# Patient Record
Sex: Female | Born: 1945 | Race: White | Hispanic: No | Smoking: Former smoker
Health system: Southern US, Community
[De-identification: ages and names within clinical notes are randomized; demographics above are authoritative.]

## PROBLEM LIST (undated history)

## (undated) DIAGNOSIS — C801 Malignant (primary) neoplasm, unspecified: Secondary | ICD-10-CM

## (undated) DIAGNOSIS — Z87442 Personal history of urinary calculi: Secondary | ICD-10-CM

## (undated) DIAGNOSIS — C50911 Malignant neoplasm of unspecified site of right female breast: Secondary | ICD-10-CM

## (undated) DIAGNOSIS — F32A Depression, unspecified: Secondary | ICD-10-CM

## (undated) DIAGNOSIS — I509 Heart failure, unspecified: Secondary | ICD-10-CM

## (undated) DIAGNOSIS — N289 Disorder of kidney and ureter, unspecified: Secondary | ICD-10-CM

## (undated) DIAGNOSIS — N189 Chronic kidney disease, unspecified: Secondary | ICD-10-CM

## (undated) DIAGNOSIS — Z7901 Long term (current) use of anticoagulants: Secondary | ICD-10-CM

## (undated) DIAGNOSIS — I700 Atherosclerosis of aorta: Secondary | ICD-10-CM

## (undated) DIAGNOSIS — J309 Allergic rhinitis, unspecified: Secondary | ICD-10-CM

## (undated) DIAGNOSIS — M1990 Unspecified osteoarthritis, unspecified site: Secondary | ICD-10-CM

## (undated) DIAGNOSIS — I4891 Unspecified atrial fibrillation: Secondary | ICD-10-CM

## (undated) DIAGNOSIS — E785 Hyperlipidemia, unspecified: Secondary | ICD-10-CM

---

## 2005-05-07 HISTORY — PX: MASTECTOMY: SHX3

## 2005-05-15 ENCOUNTER — Ambulatory Visit: Payer: Self-pay | Admitting: Family Medicine

## 2005-06-01 ENCOUNTER — Other Ambulatory Visit: Payer: Self-pay

## 2005-06-05 ENCOUNTER — Ambulatory Visit: Payer: Self-pay | Admitting: General Surgery

## 2005-06-18 ENCOUNTER — Ambulatory Visit: Payer: Self-pay | Admitting: Internal Medicine

## 2005-06-26 ENCOUNTER — Ambulatory Visit: Payer: Self-pay | Admitting: General Surgery

## 2005-07-05 ENCOUNTER — Ambulatory Visit: Payer: Self-pay | Admitting: Internal Medicine

## 2005-08-05 ENCOUNTER — Ambulatory Visit: Payer: Self-pay | Admitting: Internal Medicine

## 2005-09-04 ENCOUNTER — Ambulatory Visit: Payer: Self-pay | Admitting: Internal Medicine

## 2005-10-05 ENCOUNTER — Ambulatory Visit: Payer: Self-pay | Admitting: Internal Medicine

## 2005-11-04 ENCOUNTER — Ambulatory Visit: Payer: Self-pay | Admitting: Internal Medicine

## 2005-12-05 ENCOUNTER — Ambulatory Visit: Payer: Self-pay | Admitting: Internal Medicine

## 2006-01-05 ENCOUNTER — Ambulatory Visit: Payer: Self-pay | Admitting: Internal Medicine

## 2006-02-04 ENCOUNTER — Ambulatory Visit: Payer: Self-pay | Admitting: Internal Medicine

## 2006-03-07 ENCOUNTER — Ambulatory Visit: Payer: Self-pay | Admitting: Internal Medicine

## 2006-04-16 ENCOUNTER — Ambulatory Visit: Payer: Self-pay | Admitting: Internal Medicine

## 2006-05-07 ENCOUNTER — Ambulatory Visit: Payer: Self-pay | Admitting: Internal Medicine

## 2006-06-07 ENCOUNTER — Ambulatory Visit: Payer: Self-pay | Admitting: Internal Medicine

## 2006-07-06 ENCOUNTER — Ambulatory Visit: Payer: Self-pay | Admitting: Internal Medicine

## 2006-08-06 ENCOUNTER — Ambulatory Visit: Payer: Self-pay | Admitting: Internal Medicine

## 2006-08-21 ENCOUNTER — Ambulatory Visit: Payer: Self-pay | Admitting: Internal Medicine

## 2006-09-02 IMAGING — NM NM  CARDIAC MUGA REST SCAN 2 0F 2
4 series · 24 of 24 positions shown · non-contrast
Comparison: none

REASON FOR EXAM: Breast CA, pre-chemotherapy
COMMENTS:

[Series 0: rmuga · 4.8mm · 4.80mm/px · 6 of 24 frames shown (1 of 3)]
[frame 3/24]
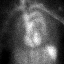
[frame 7/24]
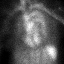
[frame 11/24]
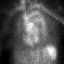
[frame 15/24]
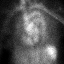
[frame 19/24]
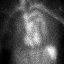
[frame 23/24]
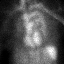

[Series 0: rmuga · 4.8mm · 4.80mm/px · 6 of 24 frames shown (2 of 3)]
[frame 3/24]
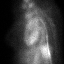
[frame 7/24]
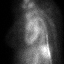
[frame 11/24]
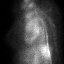
[frame 15/24]
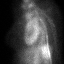
[frame 19/24]
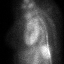
[frame 23/24]
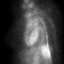

[Series 0: rmuga · 4.8mm · 4.80mm/px · 6 of 24 frames shown (3 of 3)]
[frame 3/24]
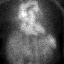
[frame 7/24]
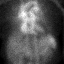
[frame 11/24]
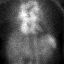
[frame 15/24]
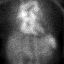
[frame 19/24]
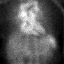
[frame 23/24]
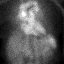

[Series 0: rmuga (results) · 4.80mm/px · 6 of 24 frames shown]
[frame 3/24]
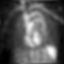
[frame 7/24]
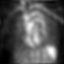
[frame 11/24]
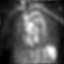
[frame 15/24]
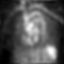
[frame 19/24]
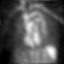
[frame 23/24]
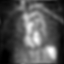

[24 of 24 positions shown; findings below may reference images not displayed]

PROCEDURE:     NM  - NM REST MUGA SCAN [DATE] OF [DATE]  - [DATE] [DATE] [DATE]  [DATE]

RESULT:     Following injection of 24.78 mCi of Technetium 99m Pertechnetate
and 3.0 ml of PYP, Rest MUGA Scan was performed.

The LEFT ventricular Cine loop shows no akinetic or dyskinetic myocardial
segments.

The LEFT ventricular ejection fraction measures 77.2% which is in the normal
range.
IMPRESSION: Normal study. The LEFT ventricular ejection fraction
measures 77.2%.

## 2006-09-05 ENCOUNTER — Ambulatory Visit: Payer: Self-pay | Admitting: Internal Medicine

## 2006-11-19 ENCOUNTER — Ambulatory Visit: Payer: Self-pay | Admitting: Internal Medicine

## 2006-12-06 ENCOUNTER — Ambulatory Visit: Payer: Self-pay | Admitting: Internal Medicine

## 2007-01-06 ENCOUNTER — Ambulatory Visit: Payer: Self-pay | Admitting: Internal Medicine

## 2007-02-28 ENCOUNTER — Ambulatory Visit: Payer: Self-pay | Admitting: Internal Medicine

## 2007-03-08 ENCOUNTER — Ambulatory Visit: Payer: Self-pay | Admitting: Internal Medicine

## 2007-05-08 ENCOUNTER — Ambulatory Visit: Payer: Self-pay | Admitting: Internal Medicine

## 2007-05-20 ENCOUNTER — Ambulatory Visit: Payer: Self-pay | Admitting: Internal Medicine

## 2007-06-08 ENCOUNTER — Ambulatory Visit: Payer: Self-pay | Admitting: Internal Medicine

## 2007-07-06 ENCOUNTER — Ambulatory Visit: Payer: Self-pay | Admitting: Internal Medicine

## 2007-08-08 ENCOUNTER — Ambulatory Visit: Payer: Self-pay | Admitting: General Surgery

## 2007-08-08 ENCOUNTER — Ambulatory Visit: Payer: Self-pay | Admitting: Internal Medicine

## 2007-09-05 ENCOUNTER — Ambulatory Visit: Payer: Self-pay | Admitting: Internal Medicine

## 2007-11-05 ENCOUNTER — Ambulatory Visit: Payer: Self-pay | Admitting: Internal Medicine

## 2009-05-07 DIAGNOSIS — I639 Cerebral infarction, unspecified: Secondary | ICD-10-CM

## 2009-05-07 HISTORY — DX: Cerebral infarction, unspecified: I63.9

## 2009-07-23 ENCOUNTER — Inpatient Hospital Stay (HOSPITAL_COMMUNITY): Admission: EM | Admit: 2009-07-23 | Discharge: 2009-07-29 | Payer: Self-pay | Admitting: Neurology

## 2009-07-25 ENCOUNTER — Encounter (INDEPENDENT_AMBULATORY_CARE_PROVIDER_SITE_OTHER): Payer: Self-pay | Admitting: Neurology

## 2009-07-26 ENCOUNTER — Ambulatory Visit: Payer: Self-pay | Admitting: Vascular Surgery

## 2009-07-26 ENCOUNTER — Encounter (INDEPENDENT_AMBULATORY_CARE_PROVIDER_SITE_OTHER): Payer: Self-pay | Admitting: Neurology

## 2009-11-15 ENCOUNTER — Encounter: Admission: RE | Admit: 2009-11-15 | Discharge: 2009-11-15 | Payer: Self-pay | Admitting: Neurology

## 2010-07-31 LAB — HEMOGLOBIN A1C
Hgb A1c MFr Bld: 5.8 % (ref 4.6–6.1)
Mean Plasma Glucose: 120 mg/dL

## 2010-07-31 LAB — LIPID PANEL
Cholesterol: 185 mg/dL (ref 0–200)
LDL Cholesterol: 108 mg/dL — ABNORMAL HIGH (ref 0–99)
Triglycerides: 73 mg/dL (ref <150)
VLDL: 15 mg/dL (ref 0–40)

## 2010-07-31 LAB — COMPREHENSIVE METABOLIC PANEL
Albumin: 3.1 g/dL — ABNORMAL LOW (ref 3.5–5.2)
Alkaline Phosphatase: 74 U/L (ref 39–117)
CO2: 26 mEq/L (ref 19–32)
Calcium: 8.5 mg/dL (ref 8.4–10.5)
GFR calc Af Amer: >60 mL/min (ref >60)
GFR calc non Af Amer: >60 mL/min (ref >60)
Sodium: 137 mEq/L (ref 135–145)
Total Protein: 5.6 g/dL — ABNORMAL LOW (ref 6.0–8.3)

## 2010-07-31 LAB — PROTIME-INR
INR: 1.08 (ref 0.00–1.49)
Prothrombin Time: 13.9 seconds (ref 11.6–15.2)

## 2010-07-31 LAB — CBC
MCV: 95.5 fL (ref 78.0–100.0)
Platelets: 164 10*3/uL (ref 150–400)
RBC: 4.18 MIL/uL (ref 3.87–5.11)
WBC: 6.8 10*3/uL (ref 4.0–10.5)

## 2010-07-31 LAB — HIGH SENSITIVITY CRP: CRP, High Sensitivity: 1.1 mg/L

## 2010-07-31 LAB — SEDIMENTATION RATE: Sed Rate: 2 mm/hr (ref 0–22)

## 2010-10-27 ENCOUNTER — Inpatient Hospital Stay: Payer: Self-pay | Admitting: Internal Medicine

## 2010-11-05 ENCOUNTER — Ambulatory Visit: Payer: Self-pay | Admitting: Internal Medicine

## 2010-11-24 ENCOUNTER — Inpatient Hospital Stay: Payer: Self-pay | Admitting: Internal Medicine

## 2010-12-06 ENCOUNTER — Ambulatory Visit: Payer: Self-pay | Admitting: Internal Medicine

## 2012-02-05 IMAGING — CR DG CHEST 1V PORT
1 series · 1 of 1 positions shown · non-contrast
Comparison: none

REASON FOR EXAM: sob
COMMENTS:

PROCEDURE:     DXR - DXR PORTABLE CHEST SINGLE VIEW  - November 23, 2010 [DATE]
RESULT:     Cardiomegaly is present with bilateral pulmonary edema. These
are new findings from 11/07/2010.

[view not recorded]
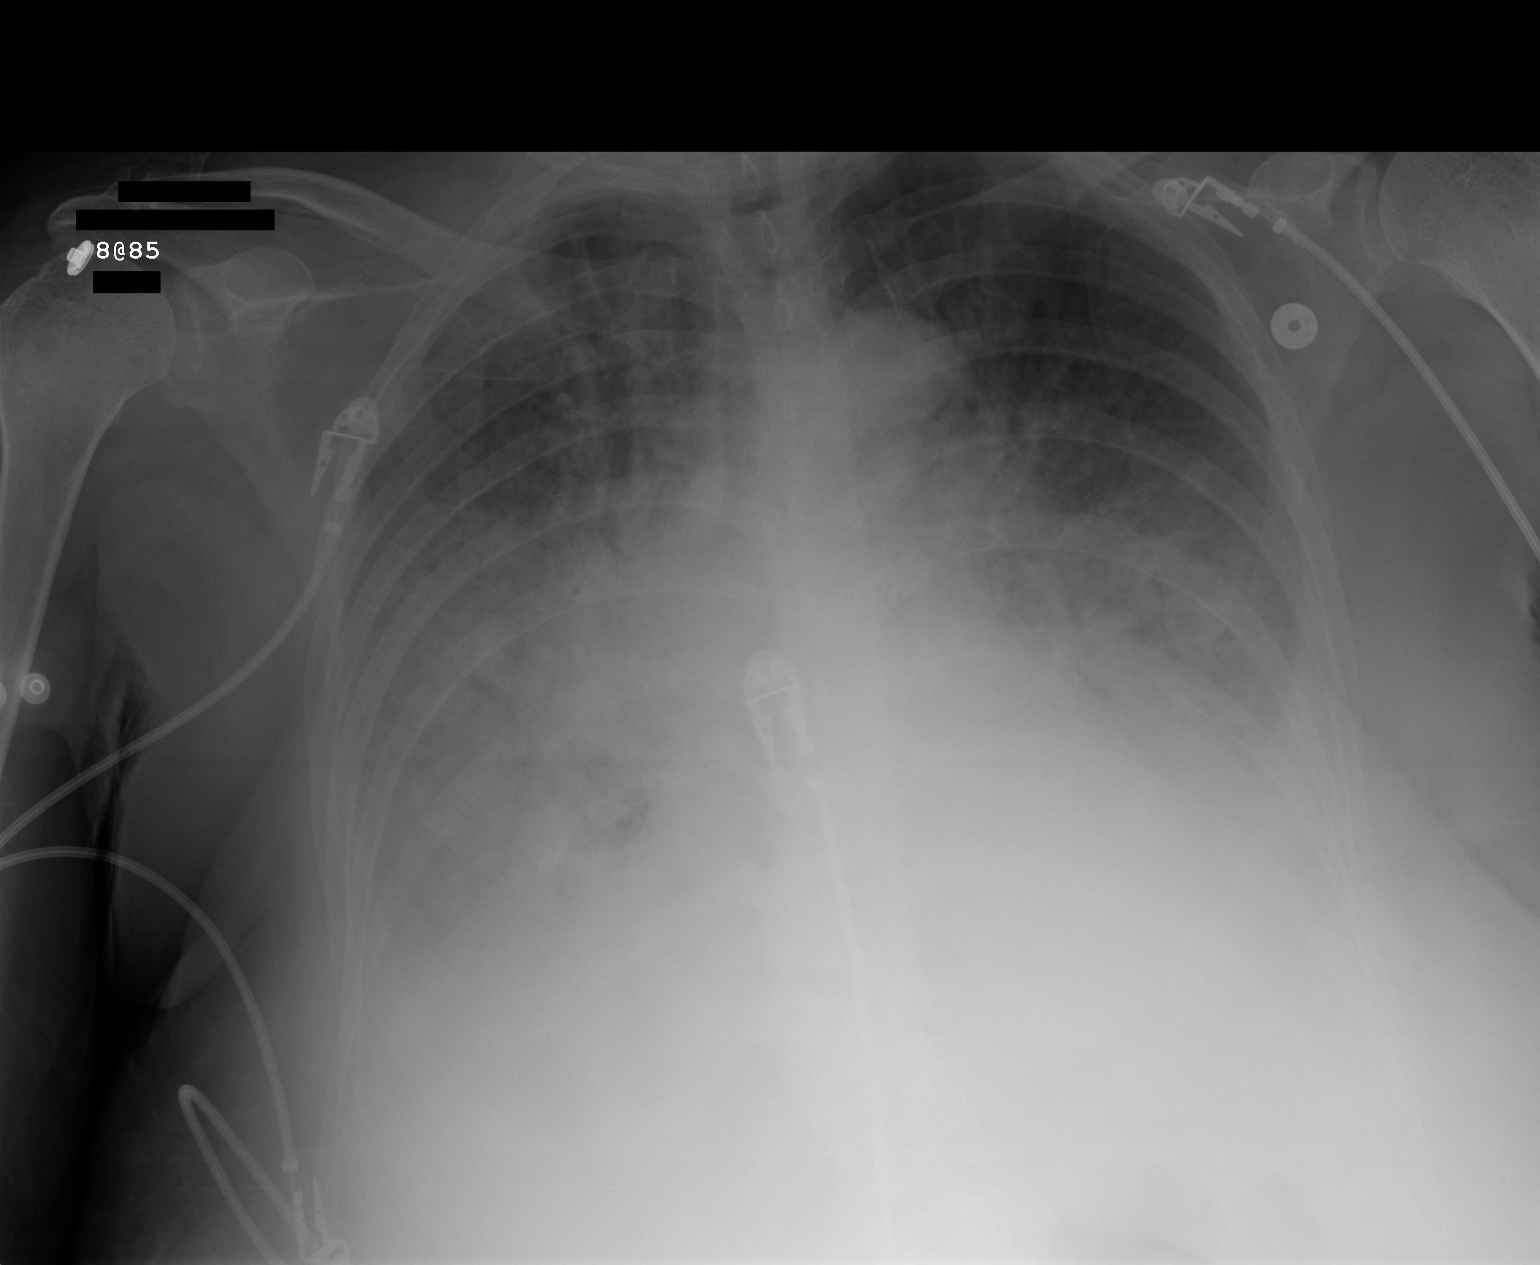

[1 of 1 positions shown; findings below may reference images not displayed]

IMPRESSION: Findings consistent with congestive heart failure with severe bilateral
pulmonary edema. Superimposed infection cannot be excluded.

## 2012-02-06 IMAGING — CT CT CHEST W/O CM
1 series · 16 of 33 positions shown, 20 images · non-contrast
Comparison: none

REASON FOR EXAM: lg infiltrate/hypoxic resp failure chf/pneumonia?
COMMENTS:

PROCEDURE:     CT  - CT CHEST WITHOUT CONTRAST  - November 24, 2010  [DATE]
RESULT:
HISTORY: Infiltrate, respiratory failure.
COMPARISON STUDY: Prior CT of 11/19/2010.

[Series 2: soft tissue · axial · 0.66mm/px · z∈[+688,+958]mm · 16 of 60 slices shown, 20 images]
[im 3/60  mediastinal]
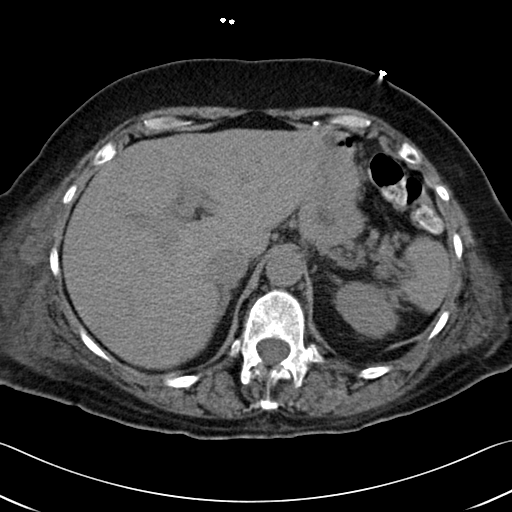
[im 3/60  lung]
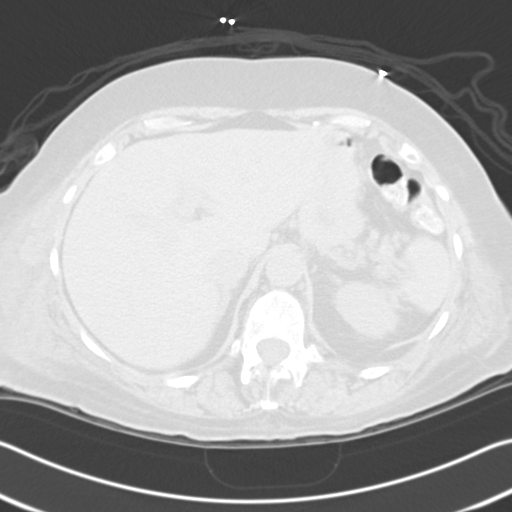
[im 7/60  lung]
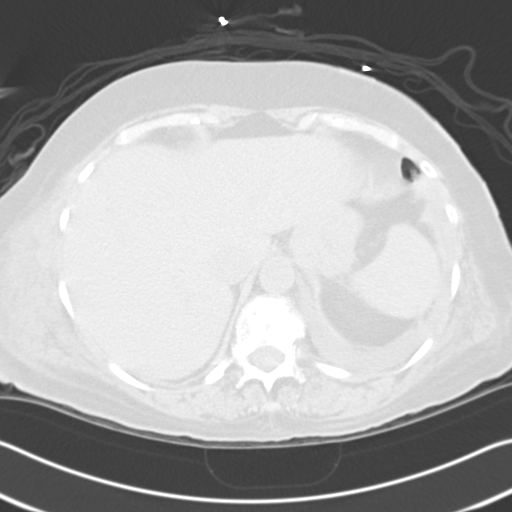
[im 11/60  lung]
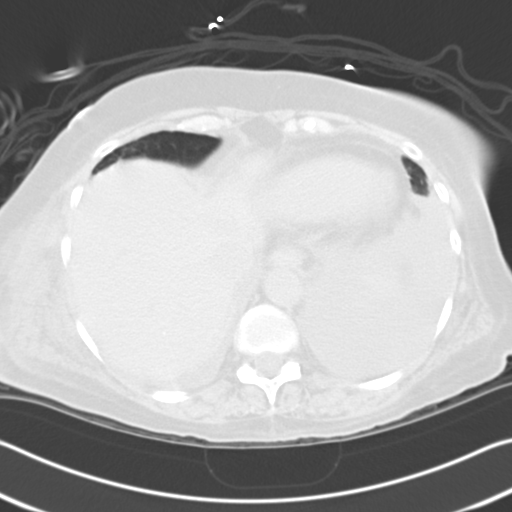
[im 14/60  lung]
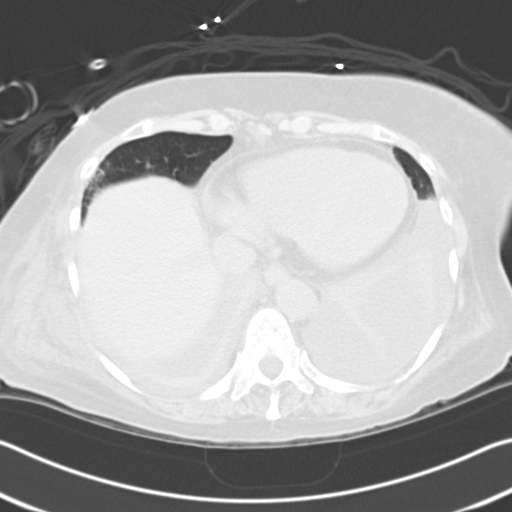
[im 18/60  mediastinal]
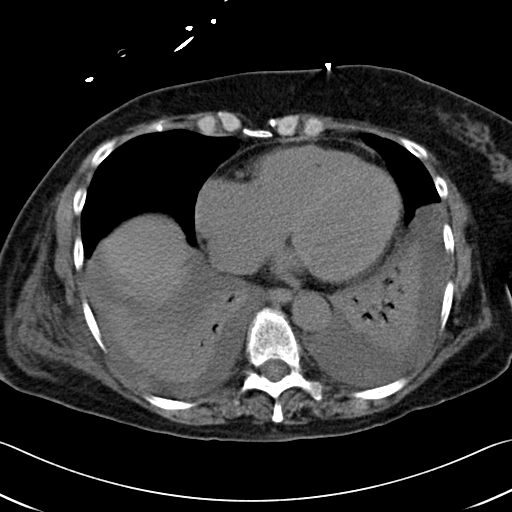
[im 18/60  lung]
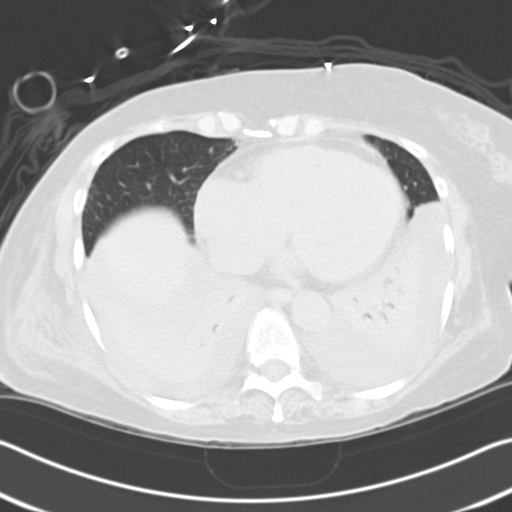
[im 22/60  lung]
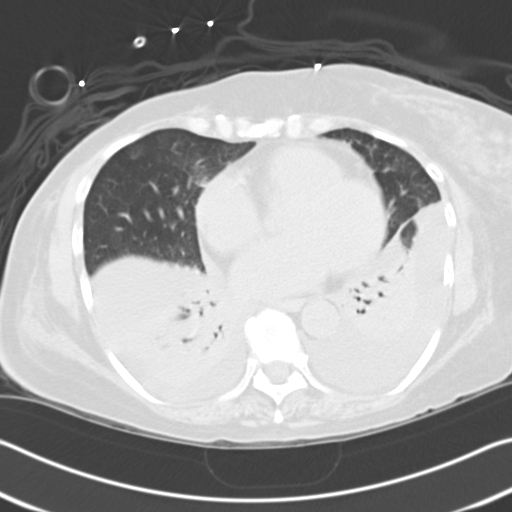
[im 25/60  lung]
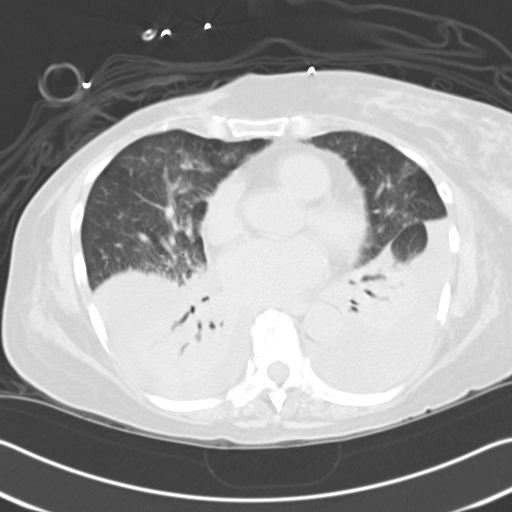
[im 29/60  lung]
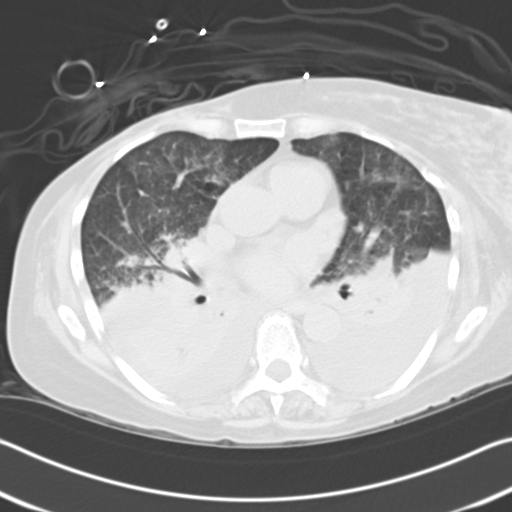
[im 32/60  mediastinal]
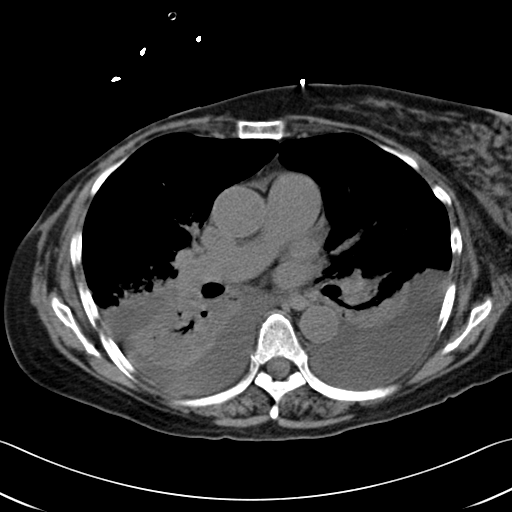
[im 32/60  lung]
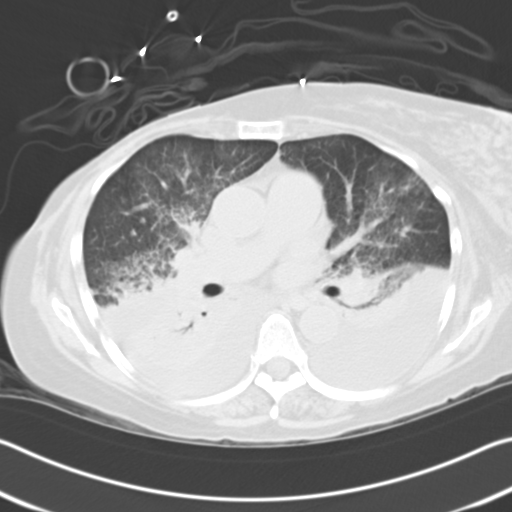
[im 35/60  lung]
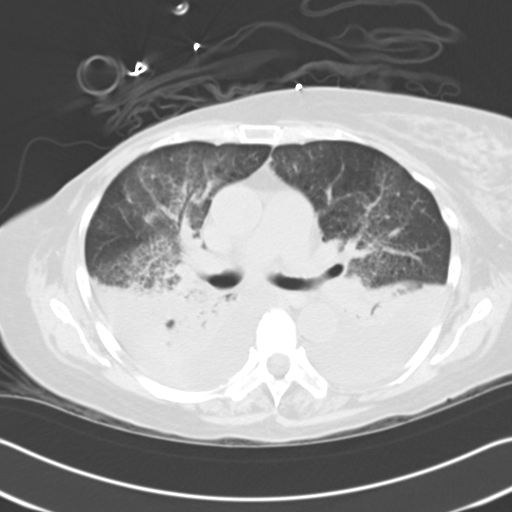
[im 38/60  lung]
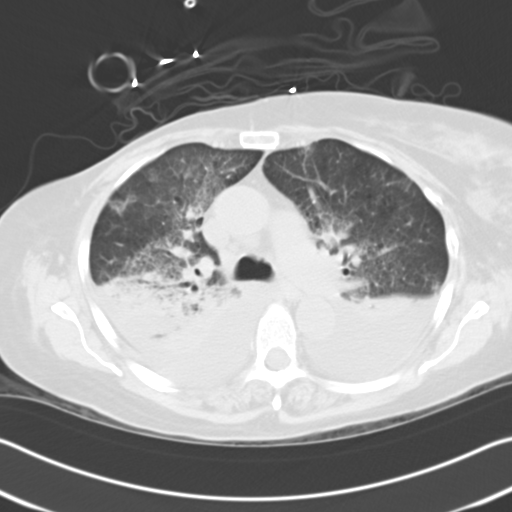
[im 42/60  lung]
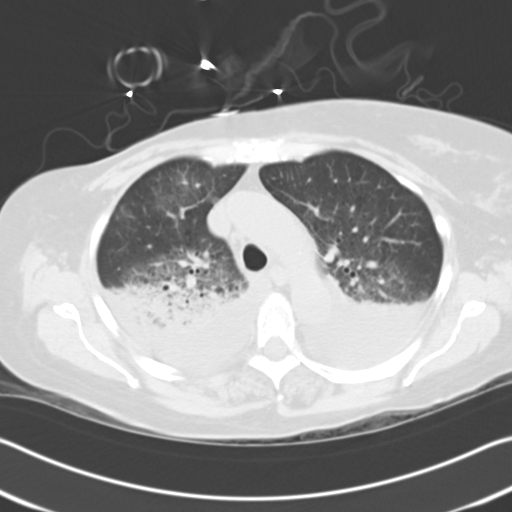
[im 46/60  mediastinal]
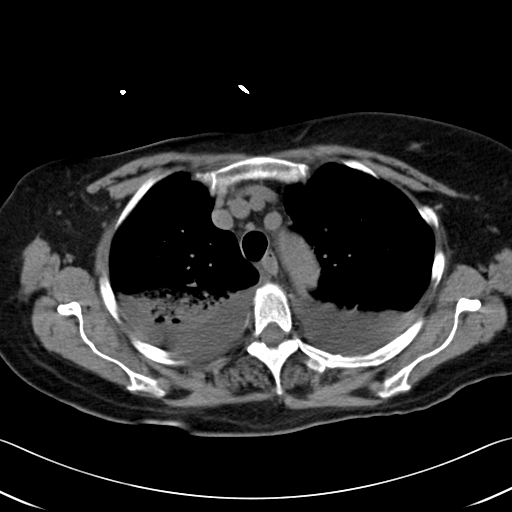
[im 46/60  lung]
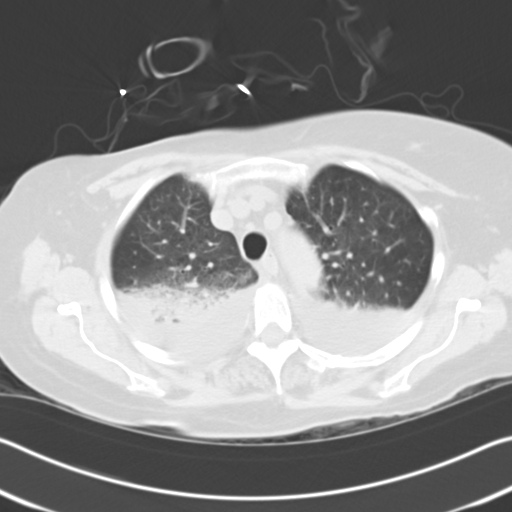
[im 49/60  lung]
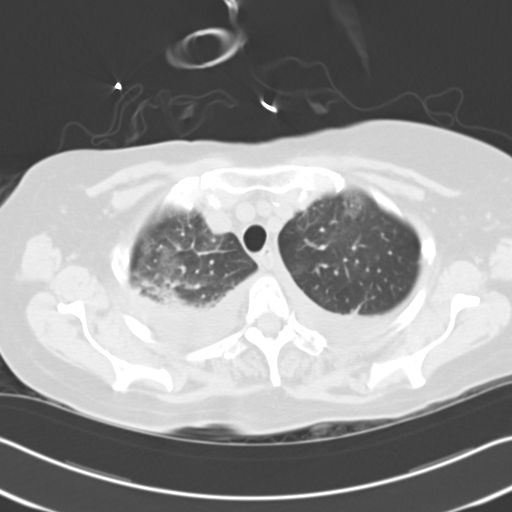
[im 53/60  lung]
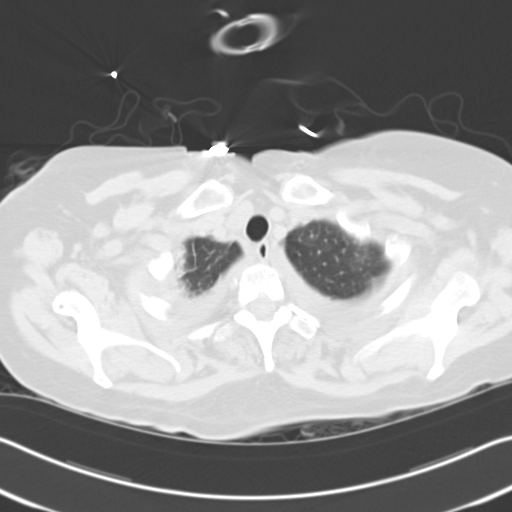
[im 57/60  lung]
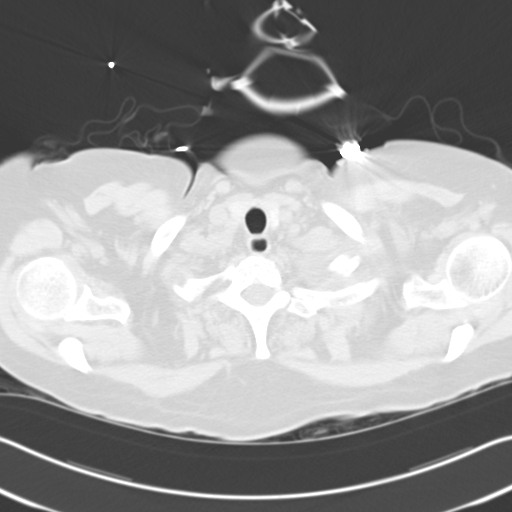

[16 of 33 positions shown; findings below may reference images not displayed]

FINDINGS: Bilateral prominent pleural effusions are present. These have
increased slightly in size from 11/09/2010. These most likely represent
changes of pneumonia. Atelectasis could present in this fashion. Pulmonary
edema from congestive heart failure  could present in this fashion.
Infiltrates are also noted in the upper lobes. Large airways are patent.
Small mediastinal lymph nodes are present. Minimal residual pericardial
thickening is present. Coronary artery disease is present. The patient has
had a right mastectomy.
IMPRESSION: Progressive bilateral pulmonary infiltrates and pleural effusions suggesting
bilateral pneumonia versus congestive heart failure.

## 2014-06-10 ENCOUNTER — Emergency Department: Payer: Self-pay | Admitting: Emergency Medicine

## 2014-06-10 LAB — URINALYSIS, COMPLETE
BILIRUBIN, UR: NEGATIVE
BLOOD: NEGATIVE
Glucose,UR: NEGATIVE mg/dL (ref 0–75)
KETONE: NEGATIVE
NITRITE: POSITIVE
Ph: 7 (ref 4.5–8.0)
Protein: NEGATIVE
Specific Gravity: 1.01 (ref 1.003–1.030)

## 2014-06-10 LAB — COMPREHENSIVE METABOLIC PANEL
ALK PHOS: 183 U/L — AB (ref 46–116)
ALT: 18 U/L (ref 14–63)
AST: 22 U/L (ref 15–37)
Albumin: 3.2 g/dL — ABNORMAL LOW (ref 3.4–5.0)
Anion Gap: 8 (ref 7–16)
BILIRUBIN TOTAL: 0.7 mg/dL (ref 0.2–1.0)
BUN: 12 mg/dL (ref 7–18)
CHLORIDE: 104 mmol/L (ref 98–107)
CO2: 27 mmol/L (ref 21–32)
CREATININE: 1.51 mg/dL — AB (ref 0.60–1.30)
Calcium, Total: 8.6 mg/dL (ref 8.5–10.1)
GFR CALC AF AMER: 44 — AB
GFR CALC NON AF AMER: 36 — AB
Glucose: 101 mg/dL — ABNORMAL HIGH (ref 65–99)
Osmolality: 277 (ref 275–301)
Potassium: 3.6 mmol/L (ref 3.5–5.1)
Sodium: 139 mmol/L (ref 136–145)
Total Protein: 7.5 g/dL (ref 6.4–8.2)

## 2014-06-10 LAB — TROPONIN I: Troponin-I: <0.02 ng/mL

## 2014-06-10 LAB — CBC
HCT: 41.5 % (ref 35.0–47.0)
HGB: 13.5 g/dL (ref 12.0–16.0)
MCH: 30.2 pg (ref 26.0–34.0)
MCHC: 32.6 g/dL (ref 32.0–36.0)
MCV: 93 fL (ref 80–100)
PLATELETS: 249 10*3/uL (ref 150–440)
RBC: 4.48 10*6/uL (ref 3.80–5.20)
RDW: 16.4 % — AB (ref 11.5–14.5)
WBC: 8.3 10*3/uL (ref 3.6–11.0)

## 2014-06-10 LAB — PROTIME-INR
INR: 2.1
PROTHROMBIN TIME: 23.9 s — AB

## 2020-08-31 ENCOUNTER — Emergency Department: Payer: Medicare Other

## 2020-08-31 ENCOUNTER — Other Ambulatory Visit: Payer: Self-pay

## 2020-08-31 ENCOUNTER — Inpatient Hospital Stay
Admission: EM | Admit: 2020-08-31 | Discharge: 2020-09-06 | DRG: 853 | Disposition: A | Discharge status: Skilled Nursing Facility | Payer: Medicare Other | Attending: Hospitalist | Admitting: Hospitalist

## 2020-08-31 ENCOUNTER — Observation Stay
Admit: 2020-08-31 | Discharge: 2020-08-31 | Disposition: A | Discharge status: Home / Self Care | Payer: Medicare Other | Attending: Internal Medicine | Admitting: Internal Medicine

## 2020-08-31 DIAGNOSIS — G8929 Other chronic pain: Secondary | ICD-10-CM | POA: Diagnosis present

## 2020-08-31 DIAGNOSIS — M1990 Unspecified osteoarthritis, unspecified site: Secondary | ICD-10-CM | POA: Diagnosis present

## 2020-08-31 DIAGNOSIS — F32A Depression, unspecified: Secondary | ICD-10-CM | POA: Diagnosis present

## 2020-08-31 DIAGNOSIS — A4159 Other Gram-negative sepsis: Secondary | ICD-10-CM | POA: Diagnosis not present

## 2020-08-31 DIAGNOSIS — K5900 Constipation, unspecified: Secondary | ICD-10-CM | POA: Diagnosis present

## 2020-08-31 DIAGNOSIS — N179 Acute kidney failure, unspecified: Secondary | ICD-10-CM | POA: Diagnosis present

## 2020-08-31 DIAGNOSIS — N319 Neuromuscular dysfunction of bladder, unspecified: Secondary | ICD-10-CM | POA: Diagnosis present

## 2020-08-31 DIAGNOSIS — I482 Chronic atrial fibrillation, unspecified: Secondary | ICD-10-CM

## 2020-08-31 DIAGNOSIS — B964 Proteus (mirabilis) (morganii) as the cause of diseases classified elsewhere: Secondary | ICD-10-CM | POA: Diagnosis present

## 2020-08-31 DIAGNOSIS — Z9011 Acquired absence of right breast and nipple: Secondary | ICD-10-CM

## 2020-08-31 DIAGNOSIS — N201 Calculus of ureter: Secondary | ICD-10-CM | POA: Diagnosis present

## 2020-08-31 DIAGNOSIS — I480 Paroxysmal atrial fibrillation: Secondary | ICD-10-CM | POA: Diagnosis present

## 2020-08-31 DIAGNOSIS — R0602 Shortness of breath: Secondary | ICD-10-CM | POA: Diagnosis not present

## 2020-08-31 DIAGNOSIS — M25569 Pain in unspecified knee: Secondary | ICD-10-CM | POA: Diagnosis present

## 2020-08-31 DIAGNOSIS — R7989 Other specified abnormal findings of blood chemistry: Secondary | ICD-10-CM

## 2020-08-31 DIAGNOSIS — Z20822 Contact with and (suspected) exposure to covid-19: Secondary | ICD-10-CM | POA: Diagnosis present

## 2020-08-31 DIAGNOSIS — I2510 Atherosclerotic heart disease of native coronary artery without angina pectoris: Secondary | ICD-10-CM | POA: Diagnosis present

## 2020-08-31 DIAGNOSIS — E861 Hypovolemia: Secondary | ICD-10-CM | POA: Diagnosis present

## 2020-08-31 DIAGNOSIS — N136 Pyonephrosis: Secondary | ICD-10-CM | POA: Diagnosis present

## 2020-08-31 DIAGNOSIS — Z880 Allergy status to penicillin: Secondary | ICD-10-CM

## 2020-08-31 DIAGNOSIS — N184 Chronic kidney disease, stage 4 (severe): Secondary | ICD-10-CM | POA: Diagnosis present

## 2020-08-31 DIAGNOSIS — J9811 Atelectasis: Secondary | ICD-10-CM | POA: Diagnosis present

## 2020-08-31 DIAGNOSIS — Z79899 Other long term (current) drug therapy: Secondary | ICD-10-CM

## 2020-08-31 DIAGNOSIS — E871 Hypo-osmolality and hyponatremia: Secondary | ICD-10-CM | POA: Diagnosis present

## 2020-08-31 DIAGNOSIS — Z7901 Long term (current) use of anticoagulants: Secondary | ICD-10-CM

## 2020-08-31 DIAGNOSIS — N289 Disorder of kidney and ureter, unspecified: Secondary | ICD-10-CM

## 2020-08-31 DIAGNOSIS — N1330 Unspecified hydronephrosis: Secondary | ICD-10-CM | POA: Diagnosis present

## 2020-08-31 DIAGNOSIS — R791 Abnormal coagulation profile: Secondary | ICD-10-CM | POA: Diagnosis present

## 2020-08-31 DIAGNOSIS — I5032 Chronic diastolic (congestive) heart failure: Secondary | ICD-10-CM | POA: Diagnosis present

## 2020-08-31 DIAGNOSIS — K219 Gastro-esophageal reflux disease without esophagitis: Secondary | ICD-10-CM | POA: Diagnosis present

## 2020-08-31 DIAGNOSIS — Z87891 Personal history of nicotine dependence: Secondary | ICD-10-CM

## 2020-08-31 DIAGNOSIS — I4891 Unspecified atrial fibrillation: Secondary | ICD-10-CM | POA: Diagnosis present

## 2020-08-31 DIAGNOSIS — J439 Emphysema, unspecified: Secondary | ICD-10-CM | POA: Diagnosis present

## 2020-08-31 DIAGNOSIS — E785 Hyperlipidemia, unspecified: Secondary | ICD-10-CM | POA: Diagnosis present

## 2020-08-31 DIAGNOSIS — E860 Dehydration: Secondary | ICD-10-CM | POA: Diagnosis present

## 2020-08-31 DIAGNOSIS — R6521 Severe sepsis with septic shock: Secondary | ICD-10-CM | POA: Diagnosis not present

## 2020-08-31 DIAGNOSIS — K449 Diaphragmatic hernia without obstruction or gangrene: Secondary | ICD-10-CM | POA: Diagnosis present

## 2020-08-31 DIAGNOSIS — Z79891 Long term (current) use of opiate analgesic: Secondary | ICD-10-CM

## 2020-08-31 DIAGNOSIS — R251 Tremor, unspecified: Secondary | ICD-10-CM | POA: Diagnosis present

## 2020-08-31 DIAGNOSIS — J9601 Acute respiratory failure with hypoxia: Secondary | ICD-10-CM | POA: Diagnosis not present

## 2020-08-31 DIAGNOSIS — R627 Adult failure to thrive: Secondary | ICD-10-CM | POA: Diagnosis present

## 2020-08-31 DIAGNOSIS — Z8673 Personal history of transient ischemic attack (TIA), and cerebral infarction without residual deficits: Secondary | ICD-10-CM

## 2020-08-31 HISTORY — DX: Allergic rhinitis, unspecified: J30.9

## 2020-08-31 HISTORY — DX: Disorder of kidney and ureter, unspecified: N28.9

## 2020-08-31 HISTORY — DX: Unspecified atrial fibrillation: I48.91

## 2020-08-31 HISTORY — DX: Depression, unspecified: F32.A

## 2020-08-31 HISTORY — DX: Hyperlipidemia, unspecified: E78.5

## 2020-08-31 HISTORY — DX: Heart failure, unspecified: I50.9

## 2020-08-31 HISTORY — DX: Unspecified osteoarthritis, unspecified site: M19.90

## 2020-08-31 LAB — SODIUM, URINE, RANDOM: Sodium, Ur: 71 mmol/L

## 2020-08-31 LAB — CBC WITH DIFFERENTIAL/PLATELET
Abs Immature Granulocytes: 0.24 10*3/uL — ABNORMAL HIGH (ref 0.00–0.07)
Basophils Absolute: 0 10*3/uL (ref 0.0–0.1)
Basophils Relative: 0 %
Eosinophils Absolute: 0 10*3/uL (ref 0.0–0.5)
Eosinophils Relative: 0 %
HCT: 37.4 % (ref 36.0–46.0)
Hemoglobin: 12.6 g/dL (ref 12.0–15.0)
Immature Granulocytes: 1 %
Lymphocytes Relative: 7 %
Lymphs Abs: 1.2 10*3/uL (ref 0.7–4.0)
MCH: 29.5 pg (ref 26.0–34.0)
MCHC: 33.7 g/dL (ref 30.0–36.0)
MCV: 87.6 fL (ref 80.0–100.0)
Monocytes Absolute: 1.6 10*3/uL — ABNORMAL HIGH (ref 0.1–1.0)
Monocytes Relative: 9 %
Neutro Abs: 13.6 10*3/uL — ABNORMAL HIGH (ref 1.7–7.7)
Neutrophils Relative %: 83 %
Platelets: 264 10*3/uL (ref 150–400)
RBC: 4.27 MIL/uL (ref 3.87–5.11)
RDW: 14.9 % (ref 11.5–15.5)
WBC: 16.7 10*3/uL — ABNORMAL HIGH (ref 4.0–10.5)
nRBC: 0 % (ref 0.0–0.2)

## 2020-08-31 LAB — URINALYSIS, COMPLETE (UACMP) WITH MICROSCOPIC
Bilirubin Urine: NEGATIVE
Glucose, UA: NEGATIVE mg/dL
Ketones, ur: NEGATIVE mg/dL
Nitrite: NEGATIVE
Protein, ur: NEGATIVE mg/dL
Specific Gravity, Urine: 1.008 (ref 1.005–1.030)
pH: 8 (ref 5.0–8.0)

## 2020-08-31 LAB — RESP PANEL BY RT-PCR (FLU A&B, COVID) ARPGX2
Influenza A by PCR: NEGATIVE
Influenza B by PCR: NEGATIVE
SARS Coronavirus 2 by RT PCR: NEGATIVE

## 2020-08-31 LAB — ECHOCARDIOGRAM COMPLETE
Height: 68 in
S' Lateral: 2.81 cm
Weight: 2924.18 oz

## 2020-08-31 LAB — BASIC METABOLIC PANEL
Anion gap: 14 (ref 5–15)
BUN: 34 mg/dL — ABNORMAL HIGH (ref 8–23)
CO2: 24 mmol/L (ref 22–32)
Calcium: 9 mg/dL (ref 8.9–10.3)
Chloride: 91 mmol/L — ABNORMAL LOW (ref 98–111)
Creatinine, Ser: 2.39 mg/dL — ABNORMAL HIGH (ref 0.44–1.00)
GFR, Estimated: 20 mL/min — ABNORMAL LOW (ref >60)
Glucose, Bld: 107 mg/dL — ABNORMAL HIGH (ref 70–99)
Potassium: 3.9 mmol/L (ref 3.5–5.1)
Sodium: 129 mmol/L — ABNORMAL LOW (ref 135–145)

## 2020-08-31 LAB — OSMOLALITY, URINE: Osmolality, Ur: 287 mOsm/kg — ABNORMAL LOW (ref 300–900)

## 2020-08-31 LAB — TROPONIN I (HIGH SENSITIVITY)
Troponin I (High Sensitivity): 19 ng/L — ABNORMAL HIGH (ref <18)
Troponin I (High Sensitivity): 21 ng/L — ABNORMAL HIGH (ref <18)

## 2020-08-31 LAB — PROTIME-INR
INR: 3.9 — ABNORMAL HIGH (ref 0.8–1.2)
Prothrombin Time: 37.9 seconds — ABNORMAL HIGH (ref 11.4–15.2)

## 2020-08-31 LAB — MRSA PCR SCREENING: MRSA by PCR: NEGATIVE

## 2020-08-31 LAB — BRAIN NATRIURETIC PEPTIDE: B Natriuretic Peptide: 170.5 pg/mL — ABNORMAL HIGH (ref 0.0–100.0)

## 2020-08-31 MED ORDER — SODIUM CHLORIDE 0.9 % IV BOLUS
1000.0000 mL | Freq: Once | INTRAVENOUS | Status: AC
  Administered 2020-08-31: 1000 mL via INTRAVENOUS

## 2020-08-31 MED ORDER — ADULT MULTIVITAMIN W/MINERALS CH
1.0000 | ORAL_TABLET | Freq: Every day | ORAL | Status: DC
  Administered 2020-08-31 – 2020-09-06 (×6): 1 via ORAL
  Filled 2020-08-31 (×6): qty 1

## 2020-08-31 MED ORDER — ZOLPIDEM TARTRATE 5 MG PO TABS
2.5000 mg | ORAL_TABLET | Freq: Every day | ORAL | Status: DC
  Administered 2020-09-01 – 2020-09-05 (×6): 2.5 mg via ORAL
  Filled 2020-08-31 (×6): qty 1

## 2020-08-31 MED ORDER — LORATADINE 10 MG PO TABS
10.0000 mg | ORAL_TABLET | Freq: Every day | ORAL | Status: DC
  Administered 2020-08-31 – 2020-09-06 (×5): 10 mg via ORAL
  Filled 2020-08-31 (×6): qty 1

## 2020-08-31 MED ORDER — FUROSEMIDE 10 MG/ML IJ SOLN
60.0000 mg | Freq: Once | INTRAMUSCULAR | Status: AC
  Administered 2020-08-31: 60 mg via INTRAVENOUS
  Filled 2020-08-31: qty 8

## 2020-08-31 MED ORDER — LORAZEPAM 2 MG/ML IJ SOLN: 0.5000 mg | Freq: Once | INTRAVENOUS | Status: DC

## 2020-08-31 MED ORDER — CYANOCOBALAMIN 500 MCG PO TABS
250.0000 ug | ORAL_TABLET | Freq: Every day | ORAL | Status: DC
  Administered 2020-09-01 – 2020-09-06 (×5): 250 ug via ORAL
  Filled 2020-08-31 (×7): qty 1

## 2020-08-31 MED ORDER — IPRATROPIUM-ALBUTEROL 0.5-2.5 (3) MG/3ML IN SOLN
3.0000 mL | Freq: Four times a day (QID) | RESPIRATORY_TRACT | Status: DC | PRN
  Administered 2020-09-01: 3 mL via RESPIRATORY_TRACT
  Filled 2020-08-31: qty 3

## 2020-08-31 MED ORDER — ACETAMINOPHEN 500 MG PO TABS
1000.0000 mg | ORAL_TABLET | Freq: Three times a day (TID) | ORAL | Status: DC
  Administered 2020-08-31 – 2020-09-06 (×17): 1000 mg via ORAL
  Filled 2020-08-31 (×18): qty 2

## 2020-08-31 MED ORDER — LORAZEPAM 2 MG/ML IJ SOLN: 0.5000 mg | Freq: Once | INTRAMUSCULAR | Status: AC

## 2020-08-31 MED ORDER — POLYETHYLENE GLYCOL 3350 17 G PO PACK
17.0000 g | PACK | Freq: Every day | ORAL | Status: DC
  Administered 2020-08-31 – 2020-09-05 (×5): 17 g via ORAL
  Filled 2020-08-31 (×5): qty 1

## 2020-08-31 MED ORDER — METOPROLOL TARTRATE 25 MG PO TABS
12.5000 mg | ORAL_TABLET | Freq: Every morning | ORAL | Status: DC
  Administered 2020-08-31 – 2020-09-04 (×4): 12.5 mg via ORAL
  Filled 2020-08-31 (×4): qty 1

## 2020-08-31 MED ORDER — LORAZEPAM 2 MG/ML IJ SOLN
INTRAMUSCULAR | Status: AC
  Administered 2020-08-31: 0.5 mg via INTRAVENOUS
  Filled 2020-08-31: qty 1

## 2020-08-31 MED ORDER — SIMVASTATIN 20 MG PO TABS
10.0000 mg | ORAL_TABLET | Freq: Every day | ORAL | Status: DC
  Administered 2020-08-31 – 2020-09-05 (×6): 10 mg via ORAL
  Filled 2020-08-31 (×6): qty 1

## 2020-08-31 MED ORDER — MOMETASONE FURO-FORMOTEROL FUM 200-5 MCG/ACT IN AERO
2.0000 | INHALATION_SPRAY | Freq: Two times a day (BID) | RESPIRATORY_TRACT | Status: DC
  Administered 2020-08-31 – 2020-09-06 (×12): 2 via RESPIRATORY_TRACT
  Filled 2020-08-31: qty 8.8

## 2020-08-31 MED ORDER — ONDANSETRON HCL 4 MG/2ML IJ SOLN
4.0000 mg | Freq: Four times a day (QID) | INTRAMUSCULAR | Status: DC | PRN
  Administered 2020-09-02 – 2020-09-03 (×3): 4 mg via INTRAVENOUS
  Filled 2020-08-31 (×2): qty 2

## 2020-08-31 MED ORDER — TRAMADOL HCL 50 MG PO TABS
50.0000 mg | ORAL_TABLET | Freq: Every day | ORAL | Status: DC
  Administered 2020-08-31 – 2020-09-06 (×6): 50 mg via ORAL
  Filled 2020-08-31 (×6): qty 1

## 2020-08-31 MED ORDER — BUPROPION HCL ER (XL) 150 MG PO TB24
300.0000 mg | ORAL_TABLET | Freq: Every day | ORAL | Status: DC
  Administered 2020-08-31 – 2020-09-06 (×7): 300 mg via ORAL
  Filled 2020-08-31 (×4): qty 2
  Filled 2020-08-31: qty 1
  Filled 2020-08-31 (×2): qty 2

## 2020-08-31 MED ORDER — ONDANSETRON HCL 4 MG PO TABS: 4.0000 mg | ORAL_TABLET | Freq: Four times a day (QID) | ORAL | Status: DC | PRN

## 2020-08-31 MED ORDER — SALINE SPRAY 0.65 % NA SOLN
2.0000 | NASAL | Status: DC | PRN
  Filled 2020-08-31: qty 44

## 2020-08-31 MED ORDER — SODIUM CHLORIDE 0.9 % IV SOLN: INTRAVENOUS | Status: DC

## 2020-08-31 NOTE — ED Notes (Signed)
Admitting MD at bedside at this time.

## 2020-08-31 NOTE — ED Provider Notes (Signed)
[facility] Emergency Department Provider Note  Time seen: 7:18 AM  I have reviewed the triage vital signs and the nursing notes.   HISTORY  Chief Complaint Shortness of Breath   HPI Jessica Long is a 75 y.o. female with a past medical history of atrial fibrillation on Coumadin, CHF, hyperlipidemia, CKD, presents to the emergency department for shortness of breath.  According to the patient for the past 3 weeks or so she has been feeling short of breath.  States she saw her doctor several days ago who performed lab work and ordered an echocardiogram which per patient was told was okay.  Patient's lab work resulted showing an elevated D-dimer per report and the patient was referred to the emergency department for further evaluation.  Here the patient denies any significant shortness of breath currently.  96 to 98% on room air during my evaluation.  Denies any increase in lower extremity edema compared to baseline.  Denies any significant cough, no fever.  No chest pain.  No leg pain.  Patient does take Coumadin for her chronic atrial fibrillation.  Past Medical History:  Diagnosis Date  . Allergic rhinitis   . Atrial fibrillation ([place])   . CHF (congestive heart failure) ([place])   . Depression   . Hyperlipemia   . Osteoarthritis   . Renal disorder     There are no problems to display for this patient.   Past Surgical History:  Procedure Laterality Date  . MASTECTOMY     right side    Prior to Admission medications   Not on File    Allergies  Allergen Reactions  . Amoxicillin   . Ampicillin     History reviewed. No pertinent family history.  Social History Social History   Tobacco Use  . Smoking status: Former Smoker  Substance Use Topics  . Alcohol use: Never  . Drug use: Never    Review of Systems Constitutional: Negative for fever. Cardiovascular: Negative for chest pain. Respiratory: 3 weeks of shortness of breath.  Worse with  exertion per patient. Gastrointestinal: Negative for abdominal pain, vomiting  Musculoskeletal: Mild lower extremity edema, chronic per patient. Skin: Negative for skin complaints  Neurological: Negative for headache All other ROS negative  ____________________________________________   PHYSICAL EXAM:  VITAL SIGNS: ED Triage Vitals  Enc Vitals Group     BP 08/31/20 0709 (!) 113/59     Pulse Rate 08/31/20 0656 98     Resp 08/31/20 0656 20     Temp 08/31/20 0701 98.3 F (36.8 C)     Temp Source 08/31/20 0701 Oral     SpO2 08/31/20 0653 96 %     Weight 08/31/20 0655 182 lb 12.2 oz (82.9 kg)     Height 08/31/20 0655 5\' 8"  (1.727 m)     Head Circumference --      Peak Flow --      Pain Score 08/31/20 0655 0     Pain Loc --      Pain Edu? --      Excl. in [place]? --    Constitutional: Alert and oriented. Well appearing and in no distress. Eyes: Normal exam ENT      Head: Normocephalic and atraumatic.      Mouth/Throat: Mucous membranes are moist. Cardiovascular: Irregular rhythm rate around 80 bpm. Respiratory: Normal respiratory effort without tachypnea nor retractions. Breath sounds are clear  Gastrointestinal: Soft and nontender. No distention. Musculoskeletal: Nontender with normal range of motion in  all extremities.  Slight lower extreme edema equal bilaterally.  No calf tenderness. Neurologic:  Normal speech and language. No gross focal neurologic deficits Skin:  Skin is warm, dry and intact.  Psychiatric: Mood and affect are normal.  ____________________________________________    EKG  EKG viewed and interpreted by myself shows atrial fibrillation at 89 bpm with a narrow QRS, normal axis, normal intervals, no concerning ST changes.  ____________________________________________    RADIOLOGY  IMPRESSION:  Mild bilateral posterior basilar subsegmental atelectasis.   Moderate size sliding-type hiatal hernia.   Mild coronary artery calcifications are noted.    Emphysema (ICD10-J43.9).   ____________________________________________   INITIAL IMPRESSION / ASSESSMENT AND PLAN / ED COURSE  Pertinent labs & imaging results that were available during my care of the patient were reviewed by me and considered in my medical decision making (see chart for details).   Patient presents emergency department for 3 weeks of shortness of breath, outpatient labs showed an elevated D-dimer.  Overall the patient appears well satting in the mid to upper 90s on room air, no distress denies any chest pain at any point.  We will check labs including cardiac enzymes as well as an INR.  We will proceed with a CTA to rule out PE or other intrathoracic abnormality such as CHF exacerbation/edema, pneumonia, pneumothorax.  Patient agreeable to plan of care.  Patient's lab work shows moderate hyponatremia with a elevated BNP and elevated creatinine consistent with acute renal insufficiency, hyponatremia possibly related to fluid overload.  Given the patient's generalized weakness and subjective shortness of breath given her lab work we will admit to the hospitalist service for IV diuresis.  We will also start the patient on a normal saline infusion given her hyponatremia.  Patient agreeable to plan of care.  Jessica Long was evaluated in Emergency Department on 08/31/2020 for the symptoms described in the history of present illness. She was evaluated in the context of the global COVID-19 pandemic, which necessitated consideration that the patient might be at risk for infection with the SARS-CoV-2 virus that causes COVID-19. Institutional protocols and algorithms that pertain to the evaluation of patients at risk for COVID-19 are in a state of rapid change based on information released by regulatory bodies including the CDC and federal and state organizations. These policies and algorithms were followed during the patient's care in the  ED.  ____________________________________________   FINAL CLINICAL IMPRESSION(S) / ED DIAGNOSES  Dyspnea Hyponatremia Weakness   Harvest Dark, MD 08/31/20 1144

## 2020-08-31 NOTE — ED Notes (Signed)
Light blue tube sent to lab with save label

## 2020-08-31 NOTE — H&P (Signed)
History and Physical    REBEKA KIMBLE YQM:[id number] DOB: 1945/05/27 DOA: 08/31/2020  PCP: Orvis Brill, [facility]   Patient coming from: Assisted living  I have personally briefly reviewed patient's old medical records in [place]  Chief Complaint: Shortness of breath   HPI: Jessica Long is a 75 y.o. female with medical history significant for CVA, history of A. fib on chronic anticoagulation with Coumadin, chronic diastolic dysfunction CHF, dyslipidemia and chronic kidney disease who was sent to the ER for evaluation of shortness of breath. Patient states she has had symptoms for 3 weeks, states that she is short of breath at rest but worse with exertion.  She has had an occasional cough productive of clear phlegm but denies having any chest pain, no lower extremity swelling, no orthopnea or PND.  She also denies having any fever or chills. She had a D-dimer done as an outpatient and was told that it was very elevated (540) and she needed to come to the ER for further evaluation. She denies having any abdominal pain, no urinary frequency, no dysuria, no nocturia, no headache, no blurred vision, no dizziness, no lightheadedness, no palpitations, no nausea, no vomiting, no fever, no chills, no headache. Labs show sodium 129, potassium 3.9, chloride 91, bicarb 24, glucose 107, BUN 34 with a baseline of 12 from 2016, creatinine 2.39 compared to baseline of 1.5 from 2016, BNP 170, white count 16.7, hemoglobin 12.6, hematocrit 37.4, MCV 87.6, RDW 14.9, platelet count 264 CT scan of the chest without contrast shows mild bilateral posterior basilar subsegmental atelectasis.Moderate size sliding-type hiatal hernia. Mild coronary artery calcifications are noted.Emphysema  Twelve-lead EKG reviewed by me shows atrial fibrillation Respiratory viral panel is pending  ED Course: Patient is a 75 year old Caucasian female who resides in a skilled nursing facility and was sent to the emergency room  for evaluation of worsening shortness of breath and elevated D-dimer. Patient was unable to get a CT angiogram due to worsening chronic kidney disease but has a supratherapeutic INR.  Labs show hyponatremia with serum sodium of 129, serum creatinine of 2.39 compared to 1.5 as well as elevated BUN. Patient will be referred to observation status for further evaluation.   Review of Systems: As per HPI otherwise all other systems reviewed and negative.    Past Medical History:  Diagnosis Date  . Allergic rhinitis   . Atrial fibrillation ([place])   . CHF (congestive heart failure) ([place])   . Depression   . Hyperlipemia   . Osteoarthritis   . Renal disorder     Past Surgical History:  Procedure Laterality Date  . MASTECTOMY     right side     reports that she has quit smoking. She does not have any smokeless tobacco history on file. She reports that she does not drink alcohol and does not use drugs.  Allergies  Allergen Reactions  . Amoxicillin   . Ampicillin     History reviewed. No pertinent family history.    Prior to Admission medications   Medication Sig Start Date End Date Taking? Authorizing Provider  acetaminophen (TYLENOL) 500 MG tablet Take 1,000 mg by mouth every 8 (eight) hours.   Yes [provider]  buPROPion (WELLBUTRIN XL) 300 MG 24 hr tablet Take 300 mg by mouth daily.   Yes [provider]  cetirizine (ZYRTEC) 10 MG tablet Take 10 mg by mouth daily.   Yes [provider]  metoprolol tartrate (LOPRESSOR) 25 MG tablet Take 12.5  mg by mouth in the morning.   Yes [provider]  Multiple Vitamin (MULTIVITAMIN) tablet Take 1 tablet by mouth daily.   Yes [provider]  potassium chloride SA (KLOR-CON) 20 MEQ tablet Take 20 mEq by mouth daily.   Yes [provider]  selenium sulfide (SELSUN) 1 % LOTN Apply 1 application topically daily.   Yes [provider]  simvastatin (ZOCOR) 10 MG tablet Take 10 mg by  mouth at bedtime.   Yes [provider]  sodium chloride (OCEAN) 0.65 % SOLN nasal spray Place 2 sprays into both nostrils as needed.   Yes [provider]  torsemide (DEMADEX) 20 MG tablet Take 60 mg by mouth daily.   Yes [provider]  traMADol (ULTRAM) 50 MG tablet Take 50 mg by mouth daily.   Yes [provider]  vitamin B-12 (CYANOCOBALAMIN) 250 MCG tablet Take 250 mcg by mouth daily.   Yes [provider]  warfarin (COUMADIN) 2 MG tablet Take 2 mg by mouth daily.   Yes [provider]  zolpidem (AMBIEN) 5 MG tablet Take 2.5 mg by mouth at bedtime.   Yes [provider]    Physical Exam: Vitals:   08/31/20 0730 08/31/20 0915 08/31/20 1000 08/31/20 1100  BP: (!) 108/52 (!) 125/96 138/76 106/79  Pulse: 92 85 88 90  Resp: 18 (!) 23 (!) 21 (!) 22  Temp:      TempSrc:      SpO2: 98% 94% 97% 100%  Weight:      Height:         Vitals:   08/31/20 0730 08/31/20 0915 08/31/20 1000 08/31/20 1100  BP: (!) 108/52 (!) 125/96 138/76 106/79  Pulse: 92 85 88 90  Resp: 18 (!) 23 (!) 21 (!) 22  Temp:      TempSrc:      SpO2: 98% 94% 97% 100%  Weight:      Height:          Constitutional: Alert and oriented x 3 . Not in any apparent distress.  Has tremors involving the upper extremities HEENT:      Head: Normocephalic and atraumatic.         Eyes: PERLA, EOMI, Conjunctivae pallor. Sclera is non-icteric.       Mouth/Throat: Mucous membranes are moist.       Neck: Supple with no signs of meningismus. Cardiovascular: Irregularly irregular. No murmurs, gallops, or rubs. 2+ symmetrical distal pulses are present . No JVD. No LE edema Respiratory: Respiratory effort normal .Lungs sounds clear.  Bilaterally. No wheezes, crackles, or rhonchi.  Gastrointestinal: Soft, non tender, and non distended with positive bowel sounds.  Genitourinary: No CVA tenderness. Musculoskeletal: Nontender with normal range of motion in all  extremities. No cyanosis, or erythema of extremities.  Tremors involving upper extremities Neurologic:  Face is symmetric. Moving all extremities. No gross focal neurologic deficits  Skin: Skin is warm, dry.  No rash or ulcers Psychiatric: Mood and affect are normal   Labs on Admission: I have personally reviewed following labs and imaging studies  CBC: Recent Labs  Lab 08/31/20 0703  WBC 16.7*  NEUTROABS 13.6*  HGB 12.6  HCT 37.4  MCV 87.6  PLT 937   Basic Metabolic Panel: Recent Labs  Lab 08/31/20 0703  NA 129*  K 3.9  CL 91*  CO2 24  GLUCOSE 107*  BUN 34*  CREATININE 2.39*  CALCIUM 9.0   GFR: Estimated Creatinine Clearance: 22.3 mL/min (A) (  by C-G formula based on SCr of 2.39 mg/dL (H)). Liver Function Tests: No results for input(s): AST, ALT, ALKPHOS, BILITOT, PROT, ALBUMIN in the last 168 hours. No results for input(s): LIPASE, AMYLASE in the last 168 hours. No results for input(s): AMMONIA in the last 168 hours. Coagulation Profile: Recent Labs  Lab 08/31/20 0703  INR 3.9*   Cardiac Enzymes: No results for input(s): CKTOTAL, CKMB, CKMBINDEX, TROPONINI in the last 168 hours. BNP (last 3 results) No results for input(s): PROBNP in the last 8760 hours. HbA1C: No results for input(s): HGBA1C in the last 72 hours. CBG: No results for input(s): GLUCAP in the last 168 hours. Lipid Profile: No results for input(s): CHOL, HDL, LDLCALC, TRIG, CHOLHDL, LDLDIRECT in the last 72 hours. Thyroid Function Tests: No results for input(s): TSH, T4TOTAL, FREET4, T3FREE, THYROIDAB in the last 72 hours. Anemia Panel: No results for input(s): VITAMINB12, FOLATE, FERRITIN, TIBC, IRON, RETICCTPCT in the last 72 hours. Urine analysis:    Component Value Date/Time   COLORURINE Yellow 06/10/2014 1035   APPEARANCEUR Cloudy 06/10/2014 1035   LABSPEC 1.010 06/10/2014 1035   PHURINE 7.0 06/10/2014 1035   GLUCOSEU Negative 06/10/2014 1035   HGBUR Negative 06/10/2014 1035    BILIRUBINUR Negative 06/10/2014 1035   KETONESUR Negative 06/10/2014 1035   PROTEINUR Negative 06/10/2014 1035   NITRITE Positive 06/10/2014 1035   LEUKOCYTESUR 3+ 06/10/2014 1035    Radiological Exams on Admission: CT Chest Wo Contrast  Result Date: 08/31/2020 CLINICAL DATA:  Chest pain. EXAM: CT CHEST WITHOUT CONTRAST TECHNIQUE: Multidetector CT imaging of the chest was performed following the standard protocol without IV contrast. COMPARISON:  November 24, 2010. FINDINGS: Cardiovascular: No evidence of thoracic aortic aneurysm. Normal cardiac size. No pericardial effusion. Mild coronary artery calcifications are noted. Mediastinum/Nodes: Moderate sliding-type hiatal hernia. No adenopathy is noted. Thyroid gland is unremarkable. Lungs/Pleura: No pneumothorax or pleural effusion is noted. Mild bilateral posterior basilar subsegmental atelectasis is noted. Minimal emphysematous disease is noted bilaterally. Upper Abdomen: Mild inflammatory changes are seen around the upper pole of left kidney with probable calyceal dilatation of the upper pole. Musculoskeletal: No chest wall mass or suspicious bone lesions identified. IMPRESSION: Mild bilateral posterior basilar subsegmental atelectasis. Moderate size sliding-type hiatal hernia. Mild coronary artery calcifications are noted. Emphysema (ICD10-J43.9). Electronically Signed   By: Marijo Conception M.D.   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 She had an elevated D-dimer as an outpatient and was referred to the hospital to obtain a CT angiogram to rule out PE. CT angiogram not done  due to worsening renal function. Patient is also on chronic anticoagulation therapy with Coumadin with a supratherapeutic INR which reduces the likelihood of having a PE. Shortness of breath may be secondary to undiagnosed COPD. (Patient noted to have emphysema on CT scan)\ Trial of bronchodilator therapy as well as inhaled steroids     Acute kidney injury At baseline patient has a serum creatinine of 1.5 (this was from 6 years ago and there are no labs to compare with) and a BUN of 12. On admission her serum creatinine is 2.39 and BUN is elevated  at 34. Hold diuretic therapy for now Gentle IV fluid hydration Repeat renal parameters in a.m.     Hyponatremia Most likely secondary to antidepressant use versus from hypovolemia related to over diuresis.  Obtain urine and serum osmolality as well as urine sodium levels. Gentle IV fluid hydration Repeat renal parameters in a.m.    Depression Continue bupropion   History of atrial fibrillation Rate controlled on metoprolol Patient is on Coumadin as secondary prophylaxis for an acute stroke Hold Coumadin since INR is 3.9 Will obtain daily PT/INR and resume Coumadin as indicated  DVT prophylaxis: Coumadin Code Status: full code Family Communication: Greater than 50% of time was spent discussing patient's condition and plan of care with her and her niece at the bedside.  CODE STATUS was discussed and she wishes to be full code.  She lists her niece June Thalia Bloodgood as her healthcare power of attorney. Disposition Plan: Back to previous home environment Consults called: none Status: Observation    Sylvestre Rathgeber MD [facility]     08/31/2020, 12:15 PM

## 2020-08-31 NOTE — ED Provider Notes (Signed)
MSE was initiated and I personally evaluated the patient and placed orders (if any) at  6:52 AM on August 31, 2020.  The patient appears stable so that the remainder of the MSE may be completed by another provider.  Patient coming in from a nursing facility with EMS with complaints of shortness of breath for several weeks.  They obtained a D-dimer and it was elevated at 540 so they sent her here for a CTA of the chest.  Hemodynamically stable with EMS.  Satting 100% on room air.   Viren Lebeau, Delice Bison, DO 08/31/20 [phone]

## 2020-08-31 NOTE — ED Triage Notes (Signed)
Pt to ED via EMS from [facility] for shortness of breath x3 weeks. EMS reports facility drew blood d/t ongoing SOB, D-Dimer of 540 so sent to ED to rule out PE. Pt not on any oxygen, denies any pain.

## 2020-08-31 NOTE — Progress Notes (Signed)
*  PRELIMINARY RESULTS* Echocardiogram 2D Echocardiogram has been performed.  Jessica Long 08/31/2020, 2:24 PM

## 2020-08-31 NOTE — ED Notes (Signed)
Pt to CT at this time.

## 2020-09-01 ENCOUNTER — Observation Stay: Payer: Medicare Other

## 2020-09-01 ENCOUNTER — Encounter: Payer: Self-pay | Admitting: Internal Medicine

## 2020-09-01 DIAGNOSIS — R0602 Shortness of breath: Secondary | ICD-10-CM | POA: Diagnosis not present

## 2020-09-01 LAB — CBC
HCT: 36.8 % (ref 36.0–46.0)
Hemoglobin: 12.4 g/dL (ref 12.0–15.0)
MCH: 30 pg (ref 26.0–34.0)
MCHC: 33.7 g/dL (ref 30.0–36.0)
MCV: 88.9 fL (ref 80.0–100.0)
Platelets: 239 10*3/uL (ref 150–400)
RBC: 4.14 MIL/uL (ref 3.87–5.11)
RDW: 15.2 % (ref 11.5–15.5)
WBC: 15.8 10*3/uL — ABNORMAL HIGH (ref 4.0–10.5)
nRBC: 0 % (ref 0.0–0.2)

## 2020-09-01 LAB — BASIC METABOLIC PANEL
Anion gap: 12 (ref 5–15)
BUN: 35 mg/dL — ABNORMAL HIGH (ref 8–23)
CO2: 24 mmol/L (ref 22–32)
Calcium: 8.5 mg/dL — ABNORMAL LOW (ref 8.9–10.3)
Chloride: 95 mmol/L — ABNORMAL LOW (ref 98–111)
Creatinine, Ser: 2.22 mg/dL — ABNORMAL HIGH (ref 0.44–1.00)
GFR, Estimated: 22 mL/min — ABNORMAL LOW (ref >60)
Glucose, Bld: 86 mg/dL (ref 70–99)
Potassium: 4 mmol/L (ref 3.5–5.1)
Sodium: 131 mmol/L — ABNORMAL LOW (ref 135–145)

## 2020-09-01 LAB — PROTIME-INR
INR: 4.8 (ref 0.8–1.2)
Prothrombin Time: 44.9 seconds — ABNORMAL HIGH (ref 11.4–15.2)

## 2020-09-01 MED ORDER — SENNOSIDES-DOCUSATE SODIUM 8.6-50 MG PO TABS
1.0000 | ORAL_TABLET | Freq: Two times a day (BID) | ORAL | Status: DC
  Administered 2020-09-01 – 2020-09-06 (×10): 1 via ORAL
  Filled 2020-09-01 (×10): qty 1

## 2020-09-01 MED ORDER — TAMSULOSIN HCL 0.4 MG PO CAPS
0.4000 mg | ORAL_CAPSULE | Freq: Every day | ORAL | Status: DC
  Administered 2020-09-01 – 2020-09-06 (×5): 0.4 mg via ORAL
  Filled 2020-09-01 (×5): qty 1

## 2020-09-01 MED ORDER — HYDROXYZINE HCL 10 MG PO TABS
10.0000 mg | ORAL_TABLET | Freq: Three times a day (TID) | ORAL | Status: DC | PRN
  Administered 2020-09-01 – 2020-09-05 (×5): 10 mg via ORAL
  Filled 2020-09-01 (×7): qty 1

## 2020-09-01 NOTE — Progress Notes (Signed)
Notified [facility] that patient had only voided 150 ml during shift and having hesitancy to void, bladder scan showed 426 ml. Provider ordered in and out. Patient voided 100 ml more in canister, bladder scaned again-325ml, did in and out and 350 ml came out, postresidual urine 15 ml.

## 2020-09-01 NOTE — Progress Notes (Signed)
PT Cancellation Note  Patient Details Name: Jessica Long MRN: [id number] DOB: 1945-09-05   Cancelled Treatment:    Reason Eval/Treat Not Completed: Medical issues which prohibited therapy (Consult received and chart reviewed. Per discussion with primary RN, patient with significant tremor, increased anxiety and fluctuating O2 levels this date; recommends hold on evaluation at this time.  Will continue efforts next date as medically appropriate and available.)  Breckan Cafiero H. Owens Shark, PT, DPT, NCS 09/01/20, 3:27 PM [phone]

## 2020-09-01 NOTE — Progress Notes (Signed)
Bladder scan done as per order and showed 25 ml. Provider is been notified.

## 2020-09-01 NOTE — Evaluation (Signed)
Occupational Therapy Evaluation Patient Details Name: Jessica Long MRN: [id number] DOB: 1945/11/23 Today's Date: 09/01/2020    History of Present Illness Pt is a 75 y/o F with PMH: CVA, history of A. fib on chronic anticoagulation with Coumadin, chronic diastolic dysfunction CHF, DLD, CKD, and h/o BRCA with R mastectomy. Pt presented from ALF d/t worsening SOB. W/u includes viral resp panel that is negative, CT chest without contrast showed "Mild bilateral posterior basilar subsegmental atelectasis. Moderate size sliding-type hiatal hernia" with orders to be seated up at 90 degrees with all oral intake. Pt also reported dysuria and UA with rare bacteria, with orders to obtain urine culture.   Clinical Impression   Pt seen for OT evaluation this date in setting of acute hospitalziation d/t SOB. Pt reports living in ALF and being MOD I for BADLs and having facility assistance for IADLs such as transportation and med mgt. Pt states she walks with a walker at baseline, but this has been becoming more and more difficult as she has become more SOB over the course of 3 weeks. Pt presents thsi date requires MIN/MOD for bed mobility and declining to stand at this time citing fatigue (in addition, her dinner tray comes and transport comes to take patient to ultrasound). Pt requires  SETUP to MIN A for seated UB ADLs (SETUP at least for meals d/t difficulty opening containers/condiments). Pt requires MOD A for seated LB ADLs d/t general weakness/decreased dyanmic sitting balance/decreased LB ROM. Overall, pt limited by current weakness and generally decreased fxl activity tolerance. At this time, anticipate that pt will require STR f/u with OT services upon dc from hospital. Will continue to follow acutely and monitor for progress as pt reports she would prefer to go back to her ALF.     Follow Up Recommendations  SNF    Equipment Recommendations  None recommended by OT (pt has all necearry equipment)     Recommendations for Other Services       Precautions / Restrictions Precautions Precautions: Fall Restrictions Weight Bearing Restrictions: No      Mobility Bed Mobility Overal bed mobility: Needs Assistance Bed Mobility: Supine to Sit     Supine to sit: Min assist;Mod assist     General bed mobility comments: increased time, HOB elevated, assistance to scoot out to EOB to complete sup to sit transition and in prep for SETUP wtih meal tray. Pt endorses that back pain makes this difficult at baseline, but reports feeling more weak than her norm at this time.    Transfers                 General transfer comment: deferred, pt citing fatigue and dinner tray presents and pt is to go to ultrasound after eating    Balance Overall balance assessment: Needs assistance Sitting-balance support: Feet supported Sitting balance-Leahy Scale: Good         Standing balance comment: deferred                           ADL either performed or assessed with clinical judgement   ADL Overall ADL's : Needs assistance/impaired                                       General ADL Comments: pt requires SETUP to MIN A for seated UB ADLs (SETUP at least for meals  d/t difficulty opening containers/condiments). Pt requires MOD A for seated LB ADLs d/t general weakness/decreased dyanmic sitting balance/decreased LB ROM.     Vision Patient Visual Report: No change from baseline       Perception     Praxis      Pertinent Vitals/Pain Pain Assessment: Faces Faces Pain Scale: Hurts little more Pain Location: chronic LBP, increased pain with mobilization Pain Descriptors / Indicators: Aching;Sore Pain Intervention(s): Limited activity within patient's tolerance;Monitored during session;Repositioned     Hand Dominance Right (reports R hand has been weaker since Mastectomy, but still uses R hand for eating/writing)   Extremity/Trunk Assessment Upper  Extremity Assessment Upper Extremity Assessment: Generalized weakness (shld ROM to 1/2 range, h/o arthritis. Elbow and wrist ROM WFL. Some decreased digit dexterirty, but overall functional for basic self care. Some grip weakness with R worse than L. MMT of R grip grossly 4-/5 and L grossly 4/5.)   Lower Extremity Assessment Lower Extremity Assessment: Generalized weakness       Communication Communication Communication: No difficulties   Cognition Arousal/Alertness: Awake/alert Behavior During Therapy: WFL for tasks assessed/performed Overall Cognitive Status: Impaired/Different from baseline                                 General Comments: Pt is oriented and appropriate with command following. Some delayed responses/increased processing time and she endorses feeling like she is "in a fog" or "slower than usual".   General Comments       Exercises Other Exercises Other Exercises: OT educates pt and family member re: role in acute setting and importance of OOB activity to prevent muscle atrophy and skin breakdown   Shoulder Instructions      Home Living Family/patient expects to be discharged to:: Assisted living                             Home Equipment: Walker - 2 wheels;Shower seat          Prior Functioning/Environment Level of Independence: Needs assistance  Gait / Transfers Assistance Needed: pt able to perform fxl mobility with RW, prefers to take her meals in her room, but does walk around facility some. ADL's / Homemaking Assistance Needed: Pt reports she is able to perform her bathing and dressing with MOD I (much of these tasks done in sitting). Facility assists with laundry and making bed as well as tranport, meals and med mgt.            OT Problem List: Decreased strength;Impaired balance (sitting and/or standing);Decreased range of motion;Decreased activity tolerance;Cardiopulmonary status limiting activity      OT  Treatment/Interventions: Self-care/ADL training;DME and/or AE instruction;Therapeutic activities;Balance training;Therapeutic exercise;Energy conservation;Patient/family education    OT Goals(Current goals can be found in the care plan section) Acute Rehab OT Goals Patient Stated Goal: to go back to my ALF OT Goal Formulation: With patient Time For Goal Achievement: 09/15/20 Potential to Achieve Goals: Good ADL Goals Pt Will Perform Grooming: with modified independence;sitting Pt Will Perform Upper Body Dressing: with supervision;sitting Pt Will Perform Lower Body Dressing: with min guard assist;with min assist;sit to/from stand (with LRAD/AE PRN) Pt Will Transfer to Toilet: with min guard assist;with min assist;ambulating;grab bars (with LRAD/BSC over toilet PRN) Pt Will Perform Toileting - Clothing Manipulation and hygiene: with min guard assist;sit to/from stand Pt/caregiver will Perform Home Exercise Program: Increased strength;Both right and left  upper extremity;With minimal assist (mindful of shld pain)  OT Frequency: Min 1X/week   Barriers to D/C:            Co-evaluation              AM-PAC OT "6 Clicks" Daily Activity     Outcome Measure Help from another person eating meals?: A Little Help from another person taking care of personal grooming?: A Little Help from another person toileting, which includes using toliet, bedpan, or urinal?: A Little Help from another person bathing (including washing, rinsing, drying)?: A Lot Help from another person to put on and taking off regular upper body clothing?: A Little Help from another person to put on and taking off regular lower body clothing?: A Lot 6 Click Score: 16   End of Session Equipment Utilized During Treatment: Oxygen Nurse Communication: Mobility status  Activity Tolerance: Patient tolerated treatment well Patient left: in bed;with bed alarm set;with call bell/phone within reach;with family/visitor present  (seated EOB to eat meal tray)  OT Visit Diagnosis: Unsteadiness on feet (R26.81);Muscle weakness (generalized) (M62.81)                Time: 5993-5701 OT Time Calculation (min): 40 min Charges:  OT General Charges $OT Visit: 1 Visit OT Evaluation $OT Eval Moderate Complexity: 1 Mod OT Treatments $Self Care/Home Management : 8-22 mins $Therapeutic Activity: 8-22 mins  Gerrianne Scale, MS, OTR/L ascom [phone] 09/01/20, 5:42 PM

## 2020-09-01 NOTE — Progress Notes (Addendum)
PROGRESS NOTE    Jessica Long  YIR:[id number] DOB: January 19, 1946 DOA: 08/31/2020 PCP: Orvis Brill, [facility]    Chief Complaint  Patient presents with  . Shortness of Breath    Brief Narrative:  Jessica Long is a 75 y.o. female with medical history significant for CVA, history of A. fib on chronic anticoagulation with Coumadin, chronic diastolic dysfunction CHF, dyslipidemia and chronic kidney disease who was sent to the ER for evaluation of shortness of breath. Patient states she has had symptoms for 3 weeks, states that she is short of breath at rest but worse with exertion.  She has had an occasional cough productive of clear phlegm  Subjective:  Denies chest pain, still feeling sob, niece said patient usually able to talk at least 62mins without issues,  But in the last three weeks, having sob while talking and walking  She used to walk twice a day, recently has not walked due to sob Got worn out with regular daily activity which is not normal to her Reports being constipated,  Has urinary retention last night required in and out cath, felt relieved after that Tremor is worse than normal Reports pain when urination recently  Reports feeling anxious  No fever, no documented hypoxia  Assessment & Plan:   Principal Problem:   Shortness of breath Active Problems:   AKI (acute kidney injury) ([place])   Atrial fibrillation ([facility])   Depression   COPD (chronic obstructive pulmonary disease) with emphysema ([place])   Sob, no hypoxia -Respiratory viral panel negative, MRSA screening negative -She is already on anticoagulation with supratherapeutic INR, less likely to have PE -CT chest without contrast showed" Mild bilateral posterior basilar subsegmental atelectasis. Moderate size sliding-type hiatal hernia. And emphysema" -echocardiogram LVEF wnl, diastolic function not able to assess . -She initially appeared dehydrated, no wheezing on exam, she does report nasal congestion,  increased nasal drainage recently -Suspect her symptoms multifactorial, from nasal congestion, acid reflux with baseline emphysema and chronic diastolic CHF -Continue Claritin, Flonase, Mucinex, start Protonix -up at [facility] with oral intake   leukocytosis report history of dysuria UA with rare bacteria, obtain urine culture Continue hydration, repeat CBC in the morning  Hyponatremia -Appear dry, hold diuretics, continue gentle hydration -Repeat BMP in the morning  Acute urinary retention Likely from constipation and decreased activity, cannot rule out baseline neurogenic bladder with history of CVA Start Flomax, increase activity, treat constipation Serial bladder scan  AKI vs CKD IV/azotemia -BUN 35/creatinine 2.39 -BUN 12/creatinine 1.5 from 2016, no interval labs available -UA with rare bacteria small leukocyte, urine culture pending -Continue hydration, obtain renal US Repeat BMP in the morning   Constipation: Start MiraLAX and Senokot   Chronic A. Fib -Rate controlled on metoprolol, on Coumadin chronically, currently on hold due to supratherapeutic INR  Supratherapeutic INR INR 4.8 Likely due to poor oral intake recently No sign of bleeding Hold Coumadin Repeat INR in the morning  H/o CVA, affected balance, no ipsilateral weakness Continue statin, on chronic coumadin  Reports walks with a walker for several years, denies recent falls  Chronic diastolic CHF Appears dry, hold diuretics, continue gentle hydration, close monitor volume status  Hyperlipidemia Continue Zocor  Moderate size sliding-type hiatal hernia.  New diagnosis Up at 13 degreee with oral intake Start ppi   emphysema Prior smoker, no wheezing on exam  Chronic knee pain -on tylenol 1000mg  tid and tramadol daily at home, continue  FTT: from ALF, will get PT/OT   Nutritional Assessment: The  patient's BMI is: Body mass index is 27.79 kg/m.Marland Kitchen Seen by dietician.  I agree with the  assessment and plan as outlined below:      Unresulted Labs (From admission, onward)          Start     Ordered   09/01/20 0500  Protime-INR  Daily,   STAT      08/31/20 1210            DVT prophylaxis: Supratherapeutic INR, on chronic Coumadin   Code Status: Full Family Communication: niece at bedside Disposition:   Status is: Observation   Dispo: The patient is from: ALF              Anticipated d/c is to: ALF vs SNF              Anticipated d/c date is: 24-48hrs                Consultants:   none  Procedures:   In and out cath for urinary retention   Antimicrobials:   Anti-infectives (From admission, onward)   None          Objective: Vitals:   09/01/20 0030 09/01/20 0445 09/01/20 0651 09/01/20 0754  BP: (!) 99/59 111/70 (!) 97/58 105/63  Pulse: (!) 58 61 64 65  Resp: 16 18  20   Temp: 97.6 F (36.4 C) 97.7 F (36.5 C)  97.8 F (36.6 C)  TempSrc:      SpO2: 97% 95%  96%  Weight:      Height:        Intake/Output Summary (Last 24 hours) at 09/01/2020 1043 Last data filed at 09/01/2020 0615 Gross per 24 hour  Intake 1274.06 ml  Output 615 ml  Net 659.06 ml   Filed Weights   08/31/20 0655  Weight: 82.9 kg    Examination:  General exam: frail,  appear weak, with resting tremor Respiratory system: Clear to auscultation. No wheezing, no rales, no rhonchi  Cardiovascular system: S1 & S2 heard, RRR. No JVD, no murmur, No pedal edema. Gastrointestinal system: Abdomen is nondistended, soft and nontender. Normal bowel sounds heard. Central nervous system: Alert and oriented. No focal neurological deficits. Extremities: Symmetric 5 x 5 power. Skin: No rashes, lesions or ulcers Psychiatry: Judgement and insight appear normal. Mood & affect appropriate.     Data Reviewed: I have personally reviewed following labs and imaging studies  CBC: Recent Labs  Lab 08/31/20 0703 09/01/20 0519  WBC 16.7* 15.8*  NEUTROABS 13.6*  --   HGB 12.6  12.4  HCT 37.4 36.8  MCV 87.6 88.9  PLT 264 017    Basic Metabolic Panel: Recent Labs  Lab 08/31/20 0703 09/01/20 0519  NA 129* 131*  K 3.9 4.0  CL 91* 95*  CO2 24 24  GLUCOSE 107* 86  BUN 34* 35*  CREATININE 2.39* 2.22*  CALCIUM 9.0 8.5*    GFR: Estimated Creatinine Clearance: 24 mL/min (A) (by C-G formula based on SCr of 2.22 mg/dL (H)).  Liver Function Tests: No results for input(s): AST, ALT, ALKPHOS, BILITOT, PROT, ALBUMIN in the last 168 hours.  CBG: No results for input(s): GLUCAP in the last 168 hours.   Recent Results (from the past 240 hour(s))  Resp Panel by RT-PCR (Flu A&B, Covid) Nasopharyngeal Swab     Status: None   Collection Time: 08/31/20 11:27 AM   Specimen: Nasopharyngeal Swab; Nasopharyngeal(NP) swabs in vial transport medium  Result Value Ref Range Status   SARS  Coronavirus 2 by RT PCR NEGATIVE NEGATIVE Final    Comment: (NOTE) SARS-CoV-2 target nucleic acids are NOT DETECTED.  The SARS-CoV-2 RNA is generally detectable in upper respiratory specimens during the acute phase of infection. The lowest concentration of SARS-CoV-2 viral copies this assay can detect is 138 copies/mL. A negative result does not preclude SARS-Cov-2 infection and should not be used as the sole basis for treatment or other patient management decisions. A negative result may occur with  improper specimen collection/handling, submission of specimen other than nasopharyngeal swab, presence of viral mutation(s) within the areas targeted by this assay, and inadequate number of viral copies(<138 copies/mL). A negative result must be combined with clinical observations, patient history, and epidemiological information. The expected result is Negative.  Fact Sheet for Patients:  EntrepreneurPulse.com.au  Fact Sheet for Healthcare Providers:  IncredibleEmployment.be  This test is no t yet approved or cleared by the [place] FDA and   has been authorized for detection and/or diagnosis of SARS-CoV-2 by FDA under an Emergency Use Authorization (EUA). This EUA will remain  in effect (meaning this test can be used) for the duration of the COVID-19 declaration under Section 564(b)(1) of the Act, 21 U.S.C.section 360bbb-3(b)(1), unless the authorization is terminated  or revoked sooner.       Influenza A by PCR NEGATIVE NEGATIVE Final   Influenza B by PCR NEGATIVE NEGATIVE Final    Comment: (NOTE) The Xpert Xpress SARS-CoV-2/FLU/RSV plus assay is intended as an aid in the diagnosis of influenza from Nasopharyngeal swab specimens and should not be used as a sole basis for treatment. Nasal washings and aspirates are unacceptable for Xpert Xpress SARS-CoV-2/FLU/RSV testing.  Fact Sheet for Patients: EntrepreneurPulse.com.au  Fact Sheet for Healthcare Providers: IncredibleEmployment.be  This test is not yet approved or cleared by the [place] FDA and has been authorized for detection and/or diagnosis of SARS-CoV-2 by FDA under an Emergency Use Authorization (EUA). This EUA will remain in effect (meaning this test can be used) for the duration of the COVID-19 declaration under Section 564(b)(1) of the Act, 21 U.S.C. section 360bbb-3(b)(1), unless the authorization is terminated or revoked.  Performed at [facility], [address] [place], [place] [address]   MRSA PCR Screening     Status: None   Collection Time: 08/31/20  1:25 PM   Specimen: Nasopharyngeal  Result Value Ref Range Status   MRSA by PCR NEGATIVE NEGATIVE Final    Comment:        The GeneXpert MRSA Assay (FDA approved for NASAL specimens only), is one component of a comprehensive MRSA colonization surveillance program. It is not intended to diagnose MRSA infection nor to guide or monitor treatment for MRSA infections. Performed at [facility], [address] [place],   [address]          Radiology Studies: CT Chest Wo Contrast  Result Date: 08/31/2020 CLINICAL DATA:  Chest pain. EXAM: CT CHEST WITHOUT CONTRAST TECHNIQUE: Multidetector CT imaging of the chest was performed following the standard protocol without IV contrast. COMPARISON:  November 24, 2010. FINDINGS: Cardiovascular: No evidence of thoracic aortic aneurysm. Normal cardiac size. No pericardial effusion. Mild coronary artery calcifications are noted. Mediastinum/Nodes: Moderate sliding-type hiatal hernia. No adenopathy is noted. Thyroid gland is unremarkable. Lungs/Pleura: No pneumothorax or pleural effusion is noted. Mild bilateral posterior basilar subsegmental atelectasis is noted. Minimal emphysematous disease is noted bilaterally. Upper Abdomen: Mild inflammatory changes are seen around the upper pole of left kidney with probable calyceal  dilatation of the upper pole. Musculoskeletal: No chest wall mass or suspicious bone lesions identified. IMPRESSION: Mild bilateral posterior basilar subsegmental atelectasis. Moderate size sliding-type hiatal hernia. Mild coronary artery calcifications are noted. Emphysema (ICD10-J43.9). Electronically Signed   By: Marijo Conception M.D.   On: 08/31/2020 08:22   ECHOCARDIOGRAM COMPLETE  Result Date: 08/31/2020    ECHOCARDIOGRAM REPORT   Patient Name:   Jessica Long Date of Exam: 08/31/2020 Medical Rec #:  [id number]     Height:       68.0 in Accession #:    [id number]    Weight:       182.8 lb Date of Birth:  05-14-1945     BSA:          1.967 m Patient Age:    65 years      BP:           159/77 mmHg Patient Gender: F             HR:           105 bpm. Exam Location:  [facility] Procedure: 2D Echo, Color Doppler and Cardiac Doppler Indications:     CHF-acute diastolic B20.10  History:         Patient has no prior history of Echocardiogram examinations.                  CHF, Arrythmias:Atrial Fibrillation; Risk Factors:Dyslipidemia.  Sonographer:     Sherrie Sport RDCS (AE) Referring  Phys:  [id number] Royce Macadamia AGBATA Diagnosing Phys: Serafina Royals MD  Sonographer Comments: Technically difficult study due to poor echo windows, no parasternal window, no apical window and suboptimal subcostal window. IMPRESSIONS  1. Left ventricular ejection fraction, by estimation, is 60 to 65%. The left ventricle has normal function. The left ventricle has no regional wall motion abnormalities. Left ventricular diastolic function could not be evaluated.  2. Right ventricular systolic function is normal. The right ventricular size is normal.  3. The mitral valve was not well visualized. Trivial mitral valve regurgitation.  4. The aortic valve was not well visualized. Aortic valve regurgitation is not visualized. FINDINGS  Left Ventricle: Left ventricular ejection fraction, by estimation, is 60 to 65%. The left ventricle has normal function. The left ventricle has no regional wall motion abnormalities. The left ventricular internal cavity size was normal in size. There is  no left ventricular hypertrophy. Left ventricular diastolic function could not be evaluated. Right Ventricle: The right ventricular size is normal. No increase in right ventricular wall thickness. Right ventricular systolic function is normal. Left Atrium: Left atrial size was normal in size. Right Atrium: Right atrial size was normal in size. Pericardium: There is no evidence of pericardial effusion. Mitral Valve: The mitral valve was not well visualized. Trivial mitral valve regurgitation. Tricuspid Valve: The tricuspid valve is not well visualized. Tricuspid valve regurgitation is trivial. Aortic Valve: The aortic valve was not well visualized. Aortic valve regurgitation is not visualized. Pulmonic Valve: The pulmonic valve was not well visualized. Pulmonic valve regurgitation is not visualized. Aorta: The aortic root and ascending aorta are structurally normal, with no evidence of dilitation. IAS/Shunts: The interatrial septum was not assessed.   LEFT VENTRICLE PLAX 2D LVIDd:         4.04 cm LVIDs:         2.81 cm LV PW:         1.22 cm LV IVS:        1.37 cm  LEFT  ATRIUM         Index LA diam:    3.10 cm 1.58 cm/m Serafina Royals MD Electronically signed by Serafina Royals MD Signature Date/Time: 08/31/2020/5:41:09 PM    Final         Scheduled Meds: . acetaminophen  1,000 mg Oral Q8H  . buPROPion  300 mg Oral Daily  . loratadine  10 mg Oral Daily  . metoprolol tartrate  12.5 mg Oral q AM  . mometasone-formoterol  2 puff Inhalation BID  . multivitamin with minerals  1 tablet Oral Daily  . polyethylene glycol  17 g Oral Daily  . simvastatin  10 mg Oral QHS  . traMADol  50 mg Oral Daily  . vitamin B-12  250 mcg Oral Daily  . zolpidem  2.5 mg Oral QHS   Continuous Infusions: . sodium chloride 75 mL/hr at 09/01/20 0310     LOS: 0 days   Time spent: 52mins Greater than 50% of this time was spent in counseling, explanation of diagnosis, planning of further management, and coordination of care.   Voice Recognition Viviann Spare dictation system was used to create this note, attempts have been made to correct errors. Please contact the author with questions and/or clarifications.   Florencia Reasons, MD PhD FACP [facility]  Available via Epic secure chat 7am-7pm for nonurgent issues Please page for urgent issues To page the attending provider between 7A-7P or the covering provider during after hours 7P-7A, please log into the web site [email or web address] and access using universal [place] password for that web site. If you do not have the password, please call the hospital operator.    09/01/2020, 10:43 AM

## 2020-09-01 NOTE — Plan of Care (Signed)

## 2020-09-02 ENCOUNTER — Encounter: Payer: Self-pay | Admitting: Internal Medicine

## 2020-09-02 ENCOUNTER — Observation Stay: Payer: Medicare Other

## 2020-09-02 DIAGNOSIS — R0602 Shortness of breath: Secondary | ICD-10-CM | POA: Diagnosis not present

## 2020-09-02 LAB — BASIC METABOLIC PANEL
Anion gap: 9 (ref 5–15)
BUN: 31 mg/dL — ABNORMAL HIGH (ref 8–23)
CO2: 25 mmol/L (ref 22–32)
Calcium: 8.6 mg/dL — ABNORMAL LOW (ref 8.9–10.3)
Chloride: 97 mmol/L — ABNORMAL LOW (ref 98–111)
Creatinine, Ser: 1.99 mg/dL — ABNORMAL HIGH (ref 0.44–1.00)
GFR, Estimated: 25 mL/min — ABNORMAL LOW (ref >60)
Glucose, Bld: 101 mg/dL — ABNORMAL HIGH (ref 70–99)
Potassium: 4 mmol/L (ref 3.5–5.1)
Sodium: 131 mmol/L — ABNORMAL LOW (ref 135–145)

## 2020-09-02 LAB — CBC WITH DIFFERENTIAL/PLATELET
Abs Immature Granulocytes: 0.31 10*3/uL — ABNORMAL HIGH (ref 0.00–0.07)
Basophils Absolute: 0.1 10*3/uL (ref 0.0–0.1)
Basophils Relative: 0 %
Eosinophils Absolute: 0.1 10*3/uL (ref 0.0–0.5)
Eosinophils Relative: 1 %
HCT: 33.1 % — ABNORMAL LOW (ref 36.0–46.0)
Hemoglobin: 10.7 g/dL — ABNORMAL LOW (ref 12.0–15.0)
Immature Granulocytes: 2 %
Lymphocytes Relative: 6 %
Lymphs Abs: 1.2 10*3/uL (ref 0.7–4.0)
MCH: 28.9 pg (ref 26.0–34.0)
MCHC: 32.3 g/dL (ref 30.0–36.0)
MCV: 89.5 fL (ref 80.0–100.0)
Monocytes Absolute: 1.8 10*3/uL — ABNORMAL HIGH (ref 0.1–1.0)
Monocytes Relative: 9 %
Neutro Abs: 16.2 10*3/uL — ABNORMAL HIGH (ref 1.7–7.7)
Neutrophils Relative %: 82 %
Platelets: 227 10*3/uL (ref 150–400)
RBC: 3.7 MIL/uL — ABNORMAL LOW (ref 3.87–5.11)
RDW: 14.9 % (ref 11.5–15.5)
WBC: 19.7 10*3/uL — ABNORMAL HIGH (ref 4.0–10.5)
nRBC: 0 % (ref 0.0–0.2)

## 2020-09-02 LAB — MAGNESIUM: Magnesium: 2.3 mg/dL (ref 1.7–2.4)

## 2020-09-02 LAB — PROTIME-INR
INR: 3.5 — ABNORMAL HIGH (ref 0.8–1.2)
Prothrombin Time: 35.3 seconds — ABNORMAL HIGH (ref 11.4–15.2)

## 2020-09-02 MED ORDER — FLUTICASONE PROPIONATE 50 MCG/ACT NA SUSP
2.0000 | Freq: Every day | NASAL | Status: DC
  Administered 2020-09-02 – 2020-09-06 (×5): 2 via NASAL
  Filled 2020-09-02: qty 16

## 2020-09-02 MED ORDER — SODIUM CHLORIDE 0.9 % IV SOLN: INTRAVENOUS | Status: DC

## 2020-09-02 MED ORDER — DEXTROSE 5 % IV SOLN
0.5000 g | Freq: Three times a day (TID) | INTRAVENOUS | Status: DC
  Administered 2020-09-02 – 2020-09-04 (×5): 0.5 g via INTRAVENOUS
  Filled 2020-09-02 (×7): qty 0.5

## 2020-09-02 MED ORDER — GUAIFENESIN ER 600 MG PO TB12
600.0000 mg | ORAL_TABLET | Freq: Two times a day (BID) | ORAL | Status: DC
  Administered 2020-09-02 – 2020-09-06 (×8): 600 mg via ORAL
  Filled 2020-09-02 (×8): qty 1

## 2020-09-02 NOTE — NC FL2 (Signed)
[place] LEVEL OF CARE SCREENING TOOL     IDENTIFICATION  Patient Name: Jessica Long Birthdate: 04/20/1946 Sex: female Admission Date (Current Location): 08/31/2020  [place] and [place] Number:  Engineering geologist and Address:  [facility], [address], [place], [place] [address]      Provider Number: [id number]  Attending Physician Name and Address:  Florencia Reasons, MD  Relative Name and Phone Number:  Ihor Austin (Niece)   [phone] [facility])    Current Level of Care: Hospital Recommended Level of Care: [place] Prior Approval Number:    Date Approved/Denied:   PASRR Number: [id number]  Discharge Plan:      Current Diagnoses: Patient Active Problem List   Diagnosis Date Noted  . AKI (acute kidney injury) ([place]) 08/31/2020  . COPD (chronic obstructive pulmonary disease) with emphysema ([place]) 08/31/2020  . Shortness of breath 08/31/2020  . Atrial fibrillation ([place])   . Depression     Orientation RESPIRATION BLADDER Height & Weight     Self,Time,Situation,Place  Normal External catheter,Incontinent Weight: 182 lb 12.2 oz (82.9 kg) Height:  5\' 8"  (172.7 cm)  BEHAVIORAL SYMPTOMS/MOOD NEUROLOGICAL BOWEL NUTRITION STATUS        Diet (2 gram sodium)  AMBULATORY STATUS COMMUNICATION OF NEEDS Skin   Limited Assist Verbally Normal                       Personal Care Assistance Level of Assistance  Bathing,Feeding,Dressing Bathing Assistance: Limited assistance Feeding assistance: Limited assistance Dressing Assistance: Limited assistance     Functional Limitations Info             SPECIAL CARE FACTORS FREQUENCY  PT (By licensed PT),OT (By licensed OT)     PT Frequency: 5 x/week OT Frequency: 5 x/week            Contractures      Additional Factors Info  Code Status,Allergies Code Status Info: full Allergies Info: amoxicillin, ampicillin           Current Medications (09/02/2020):   This is the current hospital active medication list Current Facility-Administered Medications  Medication Dose Route Frequency Provider Last Rate Last Admin  . 0.9 %  sodium chloride infusion   Intravenous Continuous Agbata, Tochukwu, MD   Stopped at 09/01/20 1123  . acetaminophen (TYLENOL) tablet 1,000 mg  1,000 mg Oral Q8H Agbata, Tochukwu, MD   1,000 mg at 09/02/20 1411  . buPROPion (WELLBUTRIN XL) 24 hr tablet 300 mg  300 mg Oral Daily Agbata, Tochukwu, MD   300 mg at 09/02/20 0910  . fluticasone (FLONASE) 50 MCG/ACT nasal spray 2 spray  2 spray Each Nare Daily Florencia Reasons, MD   2 spray at 09/02/20 1049  . guaiFENesin (MUCINEX) 12 hr tablet 600 mg  600 mg Oral BID Florencia Reasons, MD   600 mg at 09/02/20 0910  . hydrOXYzine (ATARAX/VISTARIL) tablet 10 mg  10 mg Oral TID PRN Florencia Reasons, MD   10 mg at 09/02/20 1411  . ipratropium-albuterol (DUONEB) 0.5-2.5 (3) MG/3ML nebulizer solution 3 mL  3 mL Nebulization Q6H PRN Agbata, Tochukwu, MD   3 mL at 09/01/20 1216  . loratadine (CLARITIN) tablet 10 mg  10 mg Oral Daily Agbata, Tochukwu, MD   10 mg at 09/02/20 0910  . metoprolol tartrate (LOPRESSOR) tablet 12.5 mg  12.5 mg Oral q AM Agbata, Tochukwu, MD   12.5 mg at 09/02/20 0548  . mometasone-formoterol (DULERA) 200-5  MCG/ACT inhaler 2 puff  2 puff Inhalation BID Agbata, Tochukwu, MD   2 puff at 09/02/20 0911  . multivitamin with minerals tablet 1 tablet  1 tablet Oral Daily Agbata, Tochukwu, MD   1 tablet at 09/02/20 0909  . ondansetron (ZOFRAN) tablet 4 mg  4 mg Oral Q6H PRN Agbata, Tochukwu, MD       Or  . ondansetron (ZOFRAN) injection 4 mg  4 mg Intravenous Q6H PRN Agbata, Tochukwu, MD   4 mg at 09/02/20 1507  . polyethylene glycol (MIRALAX / GLYCOLAX) packet 17 g  17 g Oral Daily Agbata, Tochukwu, MD   17 g at 09/02/20 0909  . senna-docusate (Senokot-S) tablet 1 tablet  1 tablet Oral BID Florencia Reasons, MD   1 tablet at 09/02/20 0910  . simvastatin (ZOCOR) tablet 10 mg  10 mg Oral QHS Agbata, Tochukwu, MD   10  mg at 09/01/20 2125  . sodium chloride (OCEAN) 0.65 % nasal spray 2 spray  2 spray Each Nare PRN Agbata, Tochukwu, MD      . tamsulosin (FLOMAX) capsule 0.4 mg  0.4 mg Oral Daily Florencia Reasons, MD   0.4 mg at 09/02/20 0910  . traMADol (ULTRAM) tablet 50 mg  50 mg Oral Daily Agbata, Tochukwu, MD   50 mg at 09/02/20 0910  . vitamin B-12 (CYANOCOBALAMIN) tablet 250 mcg  250 mcg Oral Daily Agbata, Tochukwu, MD   250 mcg at 09/02/20 0909  . zolpidem (AMBIEN) tablet 2.5 mg  2.5 mg Oral QHS Agbata, Tochukwu, MD   2.5 mg at 09/01/20 2319     Discharge Medications: Please see discharge summary for a list of discharge medications.  Relevant Imaging Results:  Relevant Lab Results:   Additional Information SS #: [id number]  [place], LCSW

## 2020-09-02 NOTE — Consult Note (Signed)
Pharmacy Antibiotic Note  Jessica Long is a 75 y.o. female admitted on 08/31/2020 with SOB. Patient also noted to have dysuria.  Pharmacy has been consulted for aztreonam dosing for UTI  Reviewed amoxillin allergy with patient-- "happened very long time ago--Cannot remember exactly when. Mouth swelled up"  Plan:  Aztreonam 500 mg Q8H   Follow up with cultures   Height: 5\' 8"  (172.7 cm) Weight: 82.9 kg (182 lb 12.2 oz) IBW/kg (Calculated) : 63.9  Temp (24hrs), Avg:97.9 F (36.6 C), Min:97.7 F (36.5 C), Max:98.1 F (36.7 C)  Recent Labs  Lab 08/31/20 0703 09/01/20 0519 09/02/20 0527  WBC 16.7* 15.8* 19.7*  CREATININE 2.39* 2.22* 1.99*    Estimated Creatinine Clearance: 26.7 mL/min (A) (by C-G formula based on SCr of 1.99 mg/dL (H)).    Allergies  Allergen Reactions  . Amoxicillin   . Ampicillin     Microbiology results: 4/29 BCx: sent 4/29 UCx: sent  4/29 MRSA PCR: neg  Thank you for allowing pharmacy to be a part of this patient's care.  Dorothe Pea, PharmD, BCPS Clinical Pharmacist  09/02/2020 8:07 PM

## 2020-09-02 NOTE — Evaluation (Signed)
Physical Therapy Evaluation Patient Details Name: Jessica Long MRN: [id number] DOB: 09/22/1945 Today's Date: 09/02/2020   History of Present Illness  Pt is a 75 y/o F with PMH: CVA, history of A. fib on chronic anticoagulation with Coumadin, chronic diastolic dysfunction CHF, DLD, CKD, and h/o BRCA with R mastectomy. Pt presented from ALF d/t worsening SOB. W/u includes viral resp panel that is negative, CT chest without contrast showed "Mild bilateral posterior basilar subsegmental atelectasis. Moderate size sliding-type hiatal hernia" with orders to be seated up at 90 degrees with all oral intake. Pt also reported dysuria and UA with rare bacteria, with orders to obtain urine culture.  Clinical Impression  Pt seen for PT evaluation with pt sitting with HOB at 57 degrees, consuming liquid. PT educated pt on MD recommendation of HOB at 90 degrees for all oral intake. Pt requires education & encouragement for participation but is able to complete supine>sit with HOB significantly elevated & extra time. Pt completes sit<>stand & stand pivot with min assist with good ability to power up to standing. PT instructs pt on BLE strengthening exercises with cuing for technique. Encouraged pt to perform exercises 3x/day and to sit OOB in recliner for meals. Pt appreciative of PT stopping by. Pt would benefit from STR upon d/c to maximize independence with functional mobility & reduce fall risk prior to return home.     Follow Up Recommendations SNF    Equipment Recommendations   (TBD in next venue)    Recommendations for Other Services       Precautions / Restrictions Precautions Precautions: Fall Precaution Comments: HOB at 90 degrees for oral intake Restrictions Weight Bearing Restrictions: No      Mobility  Bed Mobility Overal bed mobility: Needs Assistance Bed Mobility: Supine to Sit     Supine to sit: Supervision;HOB elevated     General bed mobility comments: extra time to complete  supine>sit with HOB significantly elevated    Transfers Overall transfer level: Needs assistance   Transfers: Sit to/from Stand;Stand Pivot Transfers Sit to Stand: Min assist;From elevated surface Stand pivot transfers: Min assist       General transfer comment: pt demonstrates good ability to power up but does require cuing to continue taking steps bed>recliner  Ambulation/Gait                Stairs            Wheelchair Mobility    Modified Rankin (Stroke Patients Only)       Balance Overall balance assessment: Needs assistance Sitting-balance support: Feet supported Sitting balance-Leahy Scale: Good       Standing balance-Leahy Scale: Poor Standing balance comment: BUE support on RW during standing                             Pertinent Vitals/Pain Pain Assessment: Faces Faces Pain Scale: Hurts little more Pain Location: arthritic hip pain & "sore all over" Pain Descriptors / Indicators: Aching;Sore Pain Intervention(s): Limited activity within patient's tolerance;Monitored during session    Home Living Family/patient expects to be discharged to:: Assisted living               Home Equipment: Walker - 2 wheels;Shower seat      Prior Function Level of Independence: Needs assistance   Gait / Transfers Assistance Needed: pt able to perform fxl mobility with RW, prefers to take her meals in her room, but does walk around  facility some.           Hand Dominance        Extremity/Trunk Assessment   Upper Extremity Assessment Upper Extremity Assessment: Generalized weakness    Lower Extremity Assessment Lower Extremity Assessment: Generalized weakness       Communication      Cognition Arousal/Alertness: Awake/alert Behavior During Therapy: Anxious;WFL for tasks assessed/performed                                          General Comments General comments (skin integrity, edema, etc.): Pt on 0.5  L/min via nasal cannula throughout session, SpO2 90% or greater, RR did increase to 33 with transfer but does decrease at rest, pt endorsed dizziness during transfer but notes it ceased once sitting in recliner    Exercises General Exercises - Lower Extremity Long Arc Quad: AROM;Strengthening;Both;10 reps;Seated Hip ABduction/ADduction: AROM;Strengthening;Both;10 reps;Seated (hip adduction pillow squeezes) Hip Flexion/Marching: AROM;Strengthening;Both;10 reps;Seated   Assessment/Plan    PT Assessment Patient needs continued PT services  PT Problem List Decreased strength;Decreased mobility;Decreased safety awareness;Decreased activity tolerance;Cardiopulmonary status limiting activity;Decreased knowledge of use of DME;Decreased balance;Pain       PT Treatment Interventions DME instruction;Modalities;Therapeutic activities;Therapeutic exercise;Patient/family education;Gait training;Balance training;Stair training;Functional mobility training;Manual techniques;Neuromuscular re-education    PT Goals (Current goals can be found in the Care Plan section)  Acute Rehab PT Goals Patient Stated Goal: to go back to my ALF PT Goal Formulation: With patient Time For Goal Achievement: 09/16/20 Potential to Achieve Goals: Good    Frequency Min 2X/week   Barriers to discharge        Co-evaluation               AM-PAC PT "6 Clicks" Mobility  Outcome Measure Help needed turning from your back to your side while in a flat bed without using bedrails?: A Little Help needed moving from lying on your back to sitting on the side of a flat bed without using bedrails?: A Lot Help needed moving to and from a bed to a chair (including a wheelchair)?: A Little Help needed standing up from a chair using your arms (e.g., wheelchair or bedside chair)?: A Little Help needed to walk in hospital room?: A Lot Help needed climbing 3-5 steps with a railing? : Total 6 Click Score: 14    End of Session  Equipment Utilized During Treatment: Gait belt;Oxygen Activity Tolerance: Patient limited by fatigue Patient left: in chair;with call bell/phone within reach;with chair alarm set Nurse Communication: Mobility status PT Visit Diagnosis: Unsteadiness on feet (R26.81);Muscle weakness (generalized) (M62.81);Difficulty in walking, not elsewhere classified (R26.2)    Time: 9977-4142 PT Time Calculation (min) (ACUTE ONLY): 21 min   Charges:   PT Evaluation $PT Eval Low Complexity: 1 Low PT Treatments $Therapeutic Activity: 8-22 mins        Lavone Nian, PT, DPT 09/02/20, 12:46 PM   Waunita Schooner 09/02/2020, 12:39 PM

## 2020-09-02 NOTE — Progress Notes (Signed)
[place] for warfarin monitoring Indication: atrial fibrillation  Patient Measurements: Height: 5\' 8"  (172.7 cm) Weight: 82.9 kg (182 lb 12.2 oz) IBW/kg (Calculated) : 63.9  Vital Signs: Temp: 98.1 F (36.7 C) (04/29 1202) Temp Source: Oral (04/29 1202) BP: 91/76 (04/29 1202) Pulse Rate: 70 (04/29 1202)  Labs: Recent Labs    08/31/20 0703 08/31/20 0910 09/01/20 0519 09/02/20 0527  HGB 12.6  --  12.4 10.7*  HCT 37.4  --  36.8 33.1*  PLT 264  --  239 227  LABPROT 37.9*  --  44.9* 35.3*  INR 3.9*  --  4.8* 3.5*  CREATININE 2.39*  --  2.22* 1.99*  TROPONINIHS 19* 21*  --   --     Estimated Creatinine Clearance: 26.7 mL/min (A) (by C-G formula based on SCr of 1.99 mg/dL (H)).   Medical History: Past Medical History:  Diagnosis Date  . Allergic rhinitis   . Atrial fibrillation ([place])   . CHF (congestive heart failure) ([place])   . Depression   . Hyperlipemia   . Osteoarthritis   . Renal disorder     Medications:  Scheduled:  . acetaminophen  1,000 mg Oral Q8H  . buPROPion  300 mg Oral Daily  . fluticasone  2 spray Each Nare Daily  . guaiFENesin  600 mg Oral BID  . loratadine  10 mg Oral Daily  . metoprolol tartrate  12.5 mg Oral q AM  . mometasone-formoterol  2 puff Inhalation BID  . multivitamin with minerals  1 tablet Oral Daily  . polyethylene glycol  17 g Oral Daily  . senna-docusate  1 tablet Oral BID  . simvastatin  10 mg Oral QHS  . tamsulosin  0.4 mg Oral Daily  . traMADol  50 mg Oral Daily  . vitamin B-12  250 mcg Oral Daily  . zolpidem  2.5 mg Oral QHS    Assessment: 75 y.o.femalewith medical history significant forCVA, history of A. fib on chronic anticoagulation with Coumadin, chronic diastolic dysfunction CHF, dyslipidemia and chronic kidney disease who was sent to the ER for evaluation of shortness of breath. Warfarin was held on admission due to being admitted with a supratherapeutic INR. Her home regimen  of warfarin is 2 mg daily.  Goal of Therapy:  INR 2 - 3 Monitor platelets by anticoagulation protocol: Yes   Plan:   INR remains supratherapeutic: continue to hold warfarin  Daily INR to guide treatment plan  CBC at least every 3 days per protocol  Dallie Piles 09/02/2020,2:10 PM

## 2020-09-02 NOTE — Progress Notes (Signed)
PROGRESS NOTE    [name]  ZOX:[id number] DOB: [birth year] DOA: 08/31/2020 PCP: Orvis Brill, [facility]    Chief Complaint  Patient presents with  . Shortness of Breath    Brief Narrative:  Jessica Long is a 75 y.o. female with medical history significant for CVA, history of A. fib on chronic anticoagulation with Coumadin, chronic diastolic dysfunction CHF, dyslipidemia and chronic kidney disease who was sent to the ER for evaluation of shortness of breath. Patient states she has had symptoms for 3 weeks, states that she is short of breath at rest but worse with exertion.  She has had an occasional cough productive of clear phlegm  Subjective:  She is sitting up in chair, continue to report feeling weak and feeling short of breath She reported chronic low back pain and hip pain from arthritis, no new pain There is no fever, no hypoxia WBC trending up, creatinine trending down Tremor appear has improved today  Assessment & Plan:   Principal Problem:   Shortness of breath Active Problems:   AKI (acute kidney injury) ([place])   Atrial fibrillation (HCC)   Depression   COPD (chronic obstructive pulmonary disease) with emphysema (HCC)   Sob/weakness, presenting symptom -She has no hypoxia -Respiratory viral panel negative, MRSA screening negative -She is already on anticoagulation with supratherapeutic INR, less likely to have PE -CT chest without contrast showed" Mild bilateral posterior basilar subsegmental atelectasis. Moderate size sliding-type hiatal hernia. And emphysema" -echocardiogram LVEF wnl, diastolic function not able to assess . -She initially appeared dehydrated, no wheezing on exam, she does report nasal congestion, increased nasal drainage recently -Suspect her symptoms multifactorial, from nasal congestion, acid reflux with baseline emphysema and chronic diastolic CHF -Continue Claritin, Flonase, Mucinex, start Protonix -up at [place] with oral  intake   leukocytosis report history of dysuria UA with rare bacteria, urine culture in process Continue hydration, repeat CBC in the morning  Hyponatremia -Appear dry, hold diuretics, continue gentle hydration -Repeat BMP in the morning  Acute urinary retention Likely from constipation and decreased activity, cannot rule out baseline neurogenic bladder with history of CVA Start Flomax, increase activity, treat constipation Required in and out cath once, seems has resolved, Serial bladder scan  AKI vs CKD IV/azotemia -BUN 35/creatinine 2.39 -BUN 12/creatinine 1.5 from 2016, no interval labs available -UA with rare bacteria small leukocyte, urine culture pending -Continue hydration,  renal US + left-sided hydronephrosis with obstructing UPJ stone -Empirically start antibiotics -Case discussed with urology Dr Lovena Neighbours who recommend CT renal stone study, keep patient n.p.o. after midnight,     Chronic A. Fib -Rate controlled on metoprolol, on Coumadin chronically, currently on hold due to supratherapeutic INR  Supratherapeutic INR INR 4.8 Likely due to poor oral intake recently No sign of bleeding Hold Coumadin Repeat INR in the morning  H/o CVA, affected balance, no ipsilateral weakness Continue statin, on chronic coumadin  Reports walks with a walker for several years, denies recent falls  Chronic diastolic CHF Appears dry, hold diuretics, continue gentle hydration, close monitor volume status  Hyperlipidemia Continue Zocor  Moderate size sliding-type hiatal hernia.  New diagnosis Up at 30 degreee with oral intake Start ppi   emphysema Prior smoker, no wheezing on exam  Chronic knee pain -on tylenol 1000mg  tid and tramadol daily at home, continue  Constipation: Start MiraLAX and Senokot    FTT: from ALF, She used to walk twice a day, recently has not walked due to sob and weakness  PT/OT recommend  SNF   Nutritional Assessment: The patient's BMI is: Body  mass index is 27.79 kg/m.Marland Kitchen Seen by dietician.  I agree with the assessment and plan as outlined below:      Unresulted Labs (From admission, onward)          Start     Ordered   09/02/20 0854  CULTURE, BLOOD (ROUTINE X 2) w Reflex to ID Panel  BLOOD CULTURE X 2,   TIMED      09/02/20 0854   09/01/20 1102  Urine Culture  ONCE - STAT,   STAT        09/01/20 1101   09/01/20 0500  Protime-INR  Daily,   STAT (with R occurrences)      08/31/20 1210            DVT prophylaxis: Supratherapeutic INR, on chronic Coumadin   Code Status: Full Family Communication: niece at bedside on 4/28, niece over the phone on 4/29 Disposition:   Status is: Observation   Dispo: The patient is from: ALF              Anticipated d/c is to: SNF              Anticipated d/c date is: Not medically stable to discharge                Consultants:   Urology, Dr. Lovena Neighbours  Procedures:   In and out cath for urinary retention   Antimicrobials:   Anti-infectives (From admission, onward)   None          Objective: Vitals:   09/02/20 0310 09/02/20 0547 09/02/20 0748 09/02/20 1202  BP: (!) 100/53 115/61 96/62 91/76   Pulse: 75 71 61 70  Resp:   20 16  Temp: 97.7 F (36.5 C)  98 F (36.7 C) 98.1 F (36.7 C)  TempSrc: Oral  Oral Oral  SpO2:   97% 98%  Weight:      Height:        Intake/Output Summary (Last 24 hours) at 09/02/2020 1747 Last data filed at 09/02/2020 1400 Gross per 24 hour  Intake 720 ml  Output 700 ml  Net 20 ml   Filed Weights   08/31/20 0655  Weight: 82.9 kg    Examination:  General exam: frail,   weak, no resting tremor today, NAD, sitting up in chair Respiratory system: Clear to auscultation. No wheezing, no rales, no rhonchi  Cardiovascular system: S1 & S2 heard, RRR. No JVD, no murmur, No pedal edema. Gastrointestinal system: Abdomen is nondistended, soft and nontender. Normal bowel sounds heard. Central nervous system: Alert and oriented. No focal  neurological deficits. Extremities: generalized weakness, nonfocal Skin: No rashes, lesions or ulcers Psychiatry: Judgement and insight appear normal. Mood & affect appropriate.     Data Reviewed: I have personally reviewed following labs and imaging studies  CBC: Recent Labs  Lab 08/31/20 0703 09/01/20 0519 09/02/20 0527  WBC 16.7* 15.8* 19.7*  NEUTROABS 13.6*  --  16.2*  HGB 12.6 12.4 10.7*  HCT 37.4 36.8 33.1*  MCV 87.6 88.9 89.5  PLT 264 239 409    Basic Metabolic Panel: Recent Labs  Lab 08/31/20 0703 09/01/20 0519 09/02/20 0527  NA 129* 131* 131*  K 3.9 4.0 4.0  CL 91* 95* 97*  CO2 24 24 25   GLUCOSE 107* 86 101*  BUN 34* 35* 31*  CREATININE 2.39* 2.22* 1.99*  CALCIUM 9.0 8.5* 8.6*  MG  --   --  2.3  GFR: Estimated Creatinine Clearance: 26.7 mL/min (A) (by C-G formula based on SCr of 1.99 mg/dL (H)).  Liver Function Tests: No results for input(s): AST, ALT, ALKPHOS, BILITOT, PROT, ALBUMIN in the last 168 hours.  CBG: No results for input(s): GLUCAP in the last 168 hours.   Recent Results (from the past 240 hour(s))  Resp Panel by RT-PCR (Flu A&B, Covid) Nasopharyngeal Swab     Status: None   Collection Time: 08/31/20 11:27 AM   Specimen: Nasopharyngeal Swab; Nasopharyngeal(NP) swabs in vial transport medium  Result Value Ref Range Status   SARS Coronavirus 2 by RT PCR NEGATIVE NEGATIVE Final    Comment: (NOTE) SARS-CoV-2 target nucleic acids are NOT DETECTED.  The SARS-CoV-2 RNA is generally detectable in upper respiratory specimens during the acute phase of infection. The lowest concentration of SARS-CoV-2 viral copies this assay can detect is 138 copies/mL. A negative result does not preclude SARS-Cov-2 infection and should not be used as the sole basis for treatment or other patient management decisions. A negative result may occur with  improper specimen collection/handling, submission of specimen other than nasopharyngeal swab, presence of  viral mutation(s) within the areas targeted by this assay, and inadequate number of viral copies(<138 copies/mL). A negative result must be combined with clinical observations, patient history, and epidemiological information. The expected result is Negative.  Fact Sheet for Patients:  EntrepreneurPulse.com.au  Fact Sheet for Healthcare Providers:  IncredibleEmployment.be  This test is no t yet approved or cleared by the [place] FDA and  has been authorized for detection and/or diagnosis of SARS-CoV-2 by FDA under an Emergency Use Authorization (EUA). This EUA will remain  in effect (meaning this test can be used) for the duration of the COVID-19 declaration under Section 564(b)(1) of the Act, 21 U.S.C.section 360bbb-3(b)(1), unless the authorization is terminated  or revoked sooner.       Influenza A by PCR NEGATIVE NEGATIVE Final   Influenza B by PCR NEGATIVE NEGATIVE Final    Comment: (NOTE) The Xpert Xpress SARS-CoV-2/FLU/RSV plus assay is intended as an aid in the diagnosis of influenza from Nasopharyngeal swab specimens and should not be used as a sole basis for treatment. Nasal washings and aspirates are unacceptable for Xpert Xpress SARS-CoV-2/FLU/RSV testing.  Fact Sheet for Patients: EntrepreneurPulse.com.au  Fact Sheet for Healthcare Providers: IncredibleEmployment.be  This test is not yet approved or cleared by the [place] FDA and has been authorized for detection and/or diagnosis of SARS-CoV-2 by FDA under an Emergency Use Authorization (EUA). This EUA will remain in effect (meaning this test can be used) for the duration of the COVID-19 declaration under Section 564(b)(1) of the Act, 21 U.S.C. section 360bbb-3(b)(1), unless the authorization is terminated or revoked.  Performed at [facility], [address] [place], [place] [address]   MRSA PCR Screening      Status: None   Collection Time: 08/31/20  1:25 PM   Specimen: Nasopharyngeal  Result Value Ref Range Status   MRSA by PCR NEGATIVE NEGATIVE Final    Comment:        The GeneXpert MRSA Assay (FDA approved for NASAL specimens only), is one component of a comprehensive MRSA colonization surveillance program. It is not intended to diagnose MRSA infection nor to guide or monitor treatment for MRSA infections. Performed at [facility], [address] [place], [place] [address]          Radiology Studies: US RENAL  Result Date: 09/02/2020 CLINICAL DATA:  Acute renal  insufficiency EXAM: RENAL / URINARY TRACT ULTRASOUND COMPLETE COMPARISON:  CT abdomen pelvis 10/28/2010 FINDINGS: Right Kidney: Renal measurements: 10.9 x 5.5 x 4.8 cm = volume: 151 mL. Echogenicity within normal limits. No mass or hydronephrosis visualized. Left Kidney: Renal measurements: 10.7 x 6.4 x 5.6 cm = volume: 198 mL. Renal cortical thickness is preserved and cortical echogenicity is normal. There is moderate left hydronephrosis. A 19 mm terminal lower pole caliceal calculus is present. Additionally, a 17 mm obstructing calculus is seen within the right ureteropelvic junction. Bladder: Appears normal for degree of bladder distention. A right ureteral jet is identified. No left ureteral jet could be identified on this exam. Other: None. IMPRESSION: Moderate left hydronephrosis secondary to an obstructing 17 mm calculus within the left ureteropelvic junction. Superimposed 19 mm terminal lower pole nonobstructing calculus. None Electronically Signed   By: Fidela Salisbury MD   On: 09/02/2020 02:48        Scheduled Meds: . acetaminophen  1,000 mg Oral Q8H  . buPROPion  300 mg Oral Daily  . fluticasone  2 spray Each Nare Daily  . guaiFENesin  600 mg Oral BID  . loratadine  10 mg Oral Daily  . metoprolol tartrate  12.5 mg Oral q AM  . mometasone-formoterol  2 puff Inhalation BID  . multivitamin with  minerals  1 tablet Oral Daily  . polyethylene glycol  17 g Oral Daily  . senna-docusate  1 tablet Oral BID  . simvastatin  10 mg Oral QHS  . tamsulosin  0.4 mg Oral Daily  . traMADol  50 mg Oral Daily  . vitamin B-12  250 mcg Oral Daily  . zolpidem  2.5 mg Oral QHS   Continuous Infusions: . sodium chloride Stopped (09/01/20 1123)     LOS: 0 days   Time spent: 56mins Greater than 50% of this time was spent in counseling, explanation of diagnosis, planning of further management, and coordination of care.   Voice Recognition Viviann Spare dictation system was used to create this note, attempts have been made to correct errors. Please contact the author with questions and/or clarifications.   Florencia Reasons, MD PhD FACP [facility]  Available via Epic secure chat 7am-7pm for nonurgent issues Please page for urgent issues To page the attending provider between 7A-7P or the covering provider during after hours 7P-7A, please log into the web site [email or web address] and access using universal [place] password for that web site. If you do not have the password, please call the hospital operator.    09/02/2020, 5:47 PM

## 2020-09-02 NOTE — TOC Initial Note (Addendum)
Transition of Care [facility]) - Initial/Assessment Note    Patient Details  Name: Jessica Long MRN: [id number] Date of Birth: Apr 01, 1946  [facility]) CM/SW Contact:    Magnus Ivan, LCSW Phone Number: 09/02/2020, 1:48 PM  Clinical Narrative:                CSW spoke with patient and called her niece (Jessica Long) per her request regarding DC planning. Patient lives at [place]. Patient has had both COVID vaccines and the booster. Patient went to [facility] in the past. Uses a RW. Patient and niece are agreeable to PT recommendation for SNF rehab. CSW is starting SNF work up. Niece interested in [facility] or [facility].   Expected Discharge Plan: Skilled Nursing Facility Barriers to Discharge: Continued Medical Work up   Patient Goals and CMS Choice Patient states their goals for this hospitalization and ongoing recovery are:: SNF rehab CMS Medicare.gov Compare Post Acute Care list provided to:: Patient Choice offered to / list presented to : Patient (relative)  Expected Discharge Plan and Services Expected Discharge Plan: [place] arrangements for the past 2 months: [place]                                      Prior Living Arrangements/Services Living arrangements for the past 2 months: [place] Lives with:: Facility Resident Patient language and need for interpreter reviewed:: Yes Do you feel safe going back to the place where you live?: Yes      Need for Family Participation in Patient Care: Yes (Comment) Care giver support system in place?: Yes (comment) Current home services: DME Criminal Activity/Legal Involvement Pertinent to Current Situation/Hospitalization: No - Comment as needed  Activities of Daily Living Home Assistive Devices/Equipment: Walker (specify type),Grab bars in shower,Shower chair without back ADL Screening (condition at time of admission) Patient's cognitive  ability adequate to safely complete daily activities?: Yes Is the patient deaf or have difficulty hearing?: No Does the patient have difficulty seeing, even when wearing glasses/contacts?: No Does the patient have difficulty concentrating, remembering, or making decisions?: No Patient able to express need for assistance with ADLs?: Yes Does the patient have difficulty dressing or bathing?: No Independently performs ADLs?: Yes (appropriate for developmental age) Does the patient have difficulty walking or climbing stairs?: Yes Weakness of Legs: Both Weakness of Arms/Hands: Both  Permission Sought/Granted Permission sought to share information with : Facility Contact Representative,Family Supports Permission granted to share information with : Yes, Verbal Permission Granted  Share Information with NAME: Jessica Long - niece  Permission granted to share info w AGENCY: SNFs        Emotional Assessment       Orientation: : Oriented to Self,Oriented to Place,Oriented to  Time,Oriented to Situation Alcohol / Substance Use: Not Applicable Psych Involvement: No (comment)  Admission diagnosis:  Hyponatremia [E87.1] SOB (shortness of breath) [R06.02] Acute renal insufficiency [N28.9] AKI (acute kidney injury) ([place]) [N17.9] Patient Active Problem List   Diagnosis Date Noted  . AKI (acute kidney injury) ([place]) 08/31/2020  . COPD (chronic obstructive pulmonary disease) with emphysema ([place]) 08/31/2020  . Shortness of breath 08/31/2020  . Atrial fibrillation ([place])   . Depression    PCP:  [facility] Pharmacy:  No Pharmacies Listed    Social Determinants of Health (SDOH) Interventions    Readmission  Risk Interventions No flowsheet data found.

## 2020-09-03 ENCOUNTER — Observation Stay: Payer: Medicare Other

## 2020-09-03 ENCOUNTER — Encounter
Admission: EM | Disposition: A | Discharge status: Skilled Nursing Facility | Payer: Self-pay | Source: Home / Self Care | Attending: Internal Medicine

## 2020-09-03 ENCOUNTER — Observation Stay: Payer: Medicare Other | Admitting: Certified Registered"

## 2020-09-03 ENCOUNTER — Encounter: Payer: Self-pay | Admitting: Urology

## 2020-09-03 DIAGNOSIS — K449 Diaphragmatic hernia without obstruction or gangrene: Secondary | ICD-10-CM | POA: Diagnosis present

## 2020-09-03 DIAGNOSIS — N136 Pyonephrosis: Secondary | ICD-10-CM | POA: Diagnosis present

## 2020-09-03 DIAGNOSIS — R791 Abnormal coagulation profile: Secondary | ICD-10-CM | POA: Diagnosis present

## 2020-09-03 DIAGNOSIS — G8929 Other chronic pain: Secondary | ICD-10-CM | POA: Diagnosis present

## 2020-09-03 DIAGNOSIS — J9601 Acute respiratory failure with hypoxia: Secondary | ICD-10-CM | POA: Diagnosis not present

## 2020-09-03 DIAGNOSIS — B964 Proteus (mirabilis) (morganii) as the cause of diseases classified elsewhere: Secondary | ICD-10-CM | POA: Diagnosis present

## 2020-09-03 DIAGNOSIS — N201 Calculus of ureter: Secondary | ICD-10-CM | POA: Diagnosis present

## 2020-09-03 DIAGNOSIS — I482 Chronic atrial fibrillation, unspecified: Secondary | ICD-10-CM | POA: Diagnosis present

## 2020-09-03 DIAGNOSIS — Z20822 Contact with and (suspected) exposure to covid-19: Secondary | ICD-10-CM | POA: Diagnosis present

## 2020-09-03 DIAGNOSIS — F32A Depression, unspecified: Secondary | ICD-10-CM | POA: Diagnosis present

## 2020-09-03 DIAGNOSIS — E861 Hypovolemia: Secondary | ICD-10-CM | POA: Diagnosis present

## 2020-09-03 DIAGNOSIS — E871 Hypo-osmolality and hyponatremia: Secondary | ICD-10-CM | POA: Diagnosis present

## 2020-09-03 DIAGNOSIS — E785 Hyperlipidemia, unspecified: Secondary | ICD-10-CM | POA: Diagnosis present

## 2020-09-03 DIAGNOSIS — A4159 Other Gram-negative sepsis: Secondary | ICD-10-CM | POA: Diagnosis present

## 2020-09-03 DIAGNOSIS — R0602 Shortness of breath: Secondary | ICD-10-CM | POA: Diagnosis present

## 2020-09-03 DIAGNOSIS — N1330 Unspecified hydronephrosis: Secondary | ICD-10-CM | POA: Diagnosis present

## 2020-09-03 DIAGNOSIS — N179 Acute kidney failure, unspecified: Secondary | ICD-10-CM | POA: Diagnosis present

## 2020-09-03 DIAGNOSIS — N132 Hydronephrosis with renal and ureteral calculous obstruction: Secondary | ICD-10-CM | POA: Diagnosis not present

## 2020-09-03 DIAGNOSIS — N390 Urinary tract infection, site not specified: Secondary | ICD-10-CM | POA: Diagnosis not present

## 2020-09-03 DIAGNOSIS — J9811 Atelectasis: Secondary | ICD-10-CM | POA: Diagnosis present

## 2020-09-03 DIAGNOSIS — Z8673 Personal history of transient ischemic attack (TIA), and cerebral infarction without residual deficits: Secondary | ICD-10-CM | POA: Diagnosis not present

## 2020-09-03 DIAGNOSIS — N184 Chronic kidney disease, stage 4 (severe): Secondary | ICD-10-CM | POA: Diagnosis present

## 2020-09-03 DIAGNOSIS — I480 Paroxysmal atrial fibrillation: Secondary | ICD-10-CM | POA: Diagnosis present

## 2020-09-03 DIAGNOSIS — A419 Sepsis, unspecified organism: Secondary | ICD-10-CM | POA: Diagnosis not present

## 2020-09-03 DIAGNOSIS — J439 Emphysema, unspecified: Secondary | ICD-10-CM | POA: Diagnosis present

## 2020-09-03 DIAGNOSIS — K5900 Constipation, unspecified: Secondary | ICD-10-CM | POA: Diagnosis present

## 2020-09-03 DIAGNOSIS — M25569 Pain in unspecified knee: Secondary | ICD-10-CM | POA: Diagnosis present

## 2020-09-03 DIAGNOSIS — I5032 Chronic diastolic (congestive) heart failure: Secondary | ICD-10-CM | POA: Diagnosis present

## 2020-09-03 DIAGNOSIS — R6521 Severe sepsis with septic shock: Secondary | ICD-10-CM | POA: Diagnosis not present

## 2020-09-03 HISTORY — PX: CYSTOSCOPY WITH URETEROSCOPY AND STENT PLACEMENT: SHX6377

## 2020-09-03 LAB — CBC WITH DIFFERENTIAL/PLATELET
Abs Immature Granulocytes: 1.21 10*3/uL — ABNORMAL HIGH (ref 0.00–0.07)
Basophils Absolute: 0.1 10*3/uL (ref 0.0–0.1)
Basophils Relative: 0 %
Eosinophils Absolute: 0 10*3/uL (ref 0.0–0.5)
Eosinophils Relative: 0 %
HCT: 31.6 % — ABNORMAL LOW (ref 36.0–46.0)
Hemoglobin: 10.8 g/dL — ABNORMAL LOW (ref 12.0–15.0)
Immature Granulocytes: 6 %
Lymphocytes Relative: 3 %
Lymphs Abs: 0.7 10*3/uL (ref 0.7–4.0)
MCH: 29.7 pg (ref 26.0–34.0)
MCHC: 34.2 g/dL (ref 30.0–36.0)
MCV: 86.8 fL (ref 80.0–100.0)
Monocytes Absolute: 0.6 10*3/uL (ref 0.1–1.0)
Monocytes Relative: 3 %
Neutro Abs: 17.2 10*3/uL — ABNORMAL HIGH (ref 1.7–7.7)
Neutrophils Relative %: 88 %
Platelets: 219 10*3/uL (ref 150–400)
RBC: 3.64 MIL/uL — ABNORMAL LOW (ref 3.87–5.11)
RDW: 14.7 % (ref 11.5–15.5)
Smear Review: NORMAL
WBC: 19.8 10*3/uL — ABNORMAL HIGH (ref 4.0–10.5)
nRBC: 0 % (ref 0.0–0.2)

## 2020-09-03 LAB — BASIC METABOLIC PANEL
Anion gap: 8 (ref 5–15)
BUN: 28 mg/dL — ABNORMAL HIGH (ref 8–23)
CO2: 21 mmol/L — ABNORMAL LOW (ref 22–32)
Calcium: 8 mg/dL — ABNORMAL LOW (ref 8.9–10.3)
Chloride: 99 mmol/L (ref 98–111)
Creatinine, Ser: 2.02 mg/dL — ABNORMAL HIGH (ref 0.44–1.00)
GFR, Estimated: 25 mL/min — ABNORMAL LOW (ref >60)
Glucose, Bld: 104 mg/dL — ABNORMAL HIGH (ref 70–99)
Potassium: 4.1 mmol/L (ref 3.5–5.1)
Sodium: 128 mmol/L — ABNORMAL LOW (ref 135–145)

## 2020-09-03 LAB — PROTIME-INR
INR: 3 — ABNORMAL HIGH (ref 0.8–1.2)
Prothrombin Time: 31.4 seconds — ABNORMAL HIGH (ref 11.4–15.2)

## 2020-09-03 SURGERY — CYSTOURETEROSCOPY, WITH STENT INSERTION
Anesthesia: General | Laterality: Left

## 2020-09-03 MED ORDER — LACTATED RINGERS IV SOLN: INTRAVENOUS | Status: DC | PRN

## 2020-09-03 MED ORDER — FENTANYL 2500MCG IN NS 250ML (10MCG/ML) PREMIX INFUSION
0.0000 ug/h | INTRAVENOUS | Status: DC
  Administered 2020-09-03: 50 ug/h via INTRAVENOUS
  Filled 2020-09-03: qty 250

## 2020-09-03 MED ORDER — PHENYLEPHRINE HCL-NACL 10-0.9 MG/250ML-% IV SOLN
INTRAVENOUS | Status: DC | PRN
  Administered 2020-09-03: 25 ug/min via INTRAVENOUS

## 2020-09-03 MED ORDER — CHLORHEXIDINE GLUCONATE CLOTH 2 % EX PADS
6.0000 | MEDICATED_PAD | Freq: Once | CUTANEOUS | Status: AC
  Administered 2020-09-03: 6 via TOPICAL

## 2020-09-03 MED ORDER — SUCCINYLCHOLINE CHLORIDE 200 MG/10ML IV SOSY
PREFILLED_SYRINGE | INTRAVENOUS | Status: AC
  Filled 2020-09-03: qty 10

## 2020-09-03 MED ORDER — PHENYLEPHRINE HCL (PRESSORS) 10 MG/ML IV SOLN
INTRAVENOUS | Status: AC
  Filled 2020-09-03: qty 1

## 2020-09-03 MED ORDER — SUCCINYLCHOLINE CHLORIDE 20 MG/ML IJ SOLN
INTRAMUSCULAR | Status: DC | PRN
  Administered 2020-09-03: 100 mg via INTRAVENOUS

## 2020-09-03 MED ORDER — FENTANYL CITRATE (PF) 100 MCG/2ML IJ SOLN
INTRAMUSCULAR | Status: AC
  Filled 2020-09-03: qty 2

## 2020-09-03 MED ORDER — LIDOCAINE HCL (CARDIAC) PF 100 MG/5ML IV SOSY
PREFILLED_SYRINGE | INTRAVENOUS | Status: DC | PRN
  Administered 2020-09-03: 100 mg via INTRAVENOUS

## 2020-09-03 MED ORDER — ROCURONIUM BROMIDE 100 MG/10ML IV SOLN
INTRAVENOUS | Status: DC | PRN
  Administered 2020-09-03: 10 mg via INTRAVENOUS

## 2020-09-03 MED ORDER — CHLORHEXIDINE GLUCONATE 0.12% ORAL RINSE (MEDLINE KIT): 15.0000 mL | Freq: Two times a day (BID) | OROMUCOSAL | Status: DC

## 2020-09-03 MED ORDER — ORAL CARE MOUTH RINSE: 15.0000 mL | OROMUCOSAL | Status: DC

## 2020-09-03 MED ORDER — CIPROFLOXACIN IN D5W 200 MG/100ML IV SOLN
200.0000 mg | Freq: Two times a day (BID) | INTRAVENOUS | Status: DC
  Administered 2020-09-03 – 2020-09-04 (×3): 200 mg via INTRAVENOUS
  Filled 2020-09-03 (×5): qty 100

## 2020-09-03 MED ORDER — CHLORHEXIDINE GLUCONATE CLOTH 2 % EX PADS: 6.0000 | MEDICATED_PAD | Freq: Once | CUTANEOUS | Status: DC

## 2020-09-03 MED ORDER — ORAL CARE MOUTH RINSE
15.0000 mL | Freq: Two times a day (BID) | OROMUCOSAL | Status: DC
  Administered 2020-09-03 – 2020-09-06 (×4): 15 mL via OROMUCOSAL

## 2020-09-03 MED ORDER — LACTATED RINGERS IV SOLN: INTRAVENOUS | Status: DC

## 2020-09-03 MED ORDER — PHENYLEPHRINE HCL (PRESSORS) 10 MG/ML IV SOLN
INTRAVENOUS | Status: DC | PRN
  Administered 2020-09-03 (×2): 100 ug via INTRAVENOUS

## 2020-09-03 MED ORDER — ONDANSETRON HCL 4 MG/2ML IJ SOLN: 4.0000 mg | Freq: Once | INTRAMUSCULAR | Status: DC | PRN

## 2020-09-03 MED ORDER — DEXAMETHASONE SODIUM PHOSPHATE 10 MG/ML IJ SOLN
INTRAMUSCULAR | Status: DC | PRN
  Administered 2020-09-03: 10 mg via INTRAVENOUS

## 2020-09-03 MED ORDER — CHLORHEXIDINE GLUCONATE CLOTH 2 % EX PADS
6.0000 | MEDICATED_PAD | Freq: Every day | CUTANEOUS | Status: DC
  Administered 2020-09-03 – 2020-09-06 (×4): 6 via TOPICAL

## 2020-09-03 MED ORDER — SODIUM CHLORIDE 0.9 % IV BOLUS
1000.0000 mL | Freq: Once | INTRAVENOUS | Status: AC
  Administered 2020-09-03: 1000 mL via INTRAVENOUS

## 2020-09-03 MED ORDER — LIDOCAINE HCL URETHRAL/MUCOSAL 2 % EX GEL
CUTANEOUS | Status: AC
  Filled 2020-09-03: qty 5

## 2020-09-03 MED ORDER — MORPHINE SULFATE (PF) 2 MG/ML IV SOLN
2.0000 mg | INTRAVENOUS | Status: DC | PRN
  Administered 2020-09-03 – 2020-09-04 (×2): 2 mg via INTRAVENOUS
  Filled 2020-09-03 (×2): qty 1

## 2020-09-03 MED ORDER — ROCURONIUM BROMIDE 10 MG/ML (PF) SYRINGE
PREFILLED_SYRINGE | INTRAVENOUS | Status: AC
  Filled 2020-09-03: qty 10

## 2020-09-03 MED ORDER — PHENYLEPHRINE 40 MCG/ML (10ML) SYRINGE FOR IV PUSH (FOR BLOOD PRESSURE SUPPORT)
PREFILLED_SYRINGE | INTRAVENOUS | Status: AC
  Filled 2020-09-03: qty 10

## 2020-09-03 MED ORDER — MIDAZOLAM HCL 2 MG/2ML IJ SOLN
INTRAMUSCULAR | Status: AC
  Filled 2020-09-03: qty 2

## 2020-09-03 MED ORDER — PROPOFOL 10 MG/ML IV BOLUS
INTRAVENOUS | Status: DC | PRN
  Administered 2020-09-03: 100 mg via INTRAVENOUS

## 2020-09-03 MED ORDER — PROPOFOL 500 MG/50ML IV EMUL
INTRAVENOUS | Status: AC
  Filled 2020-09-03: qty 50

## 2020-09-03 MED ORDER — PROPOFOL 10 MG/ML IV BOLUS
INTRAVENOUS | Status: AC
  Filled 2020-09-03: qty 20

## 2020-09-03 MED ORDER — FENTANYL CITRATE (PF) 100 MCG/2ML IJ SOLN
25.0000 ug | INTRAMUSCULAR | Status: DC | PRN
  Administered 2020-09-03 (×2): 25 ug via INTRAVENOUS
  Administered 2020-09-03 (×3): 50 ug via INTRAVENOUS

## 2020-09-03 SURGICAL SUPPLY — 13 items
BAG URO DRAIN 4000ML (MISCELLANEOUS) ×4 IMPLANT
CATH FOL 2WAY LX 16X30 (CATHETERS) ×2 IMPLANT
CATH URET 5FR 28IN OPEN ENDED (CATHETERS) ×2 IMPLANT
GLIDEWIRE STR 0.035 150CM 3CM (WIRE) ×2 IMPLANT
GLOVE SURG ENC TEXT LTX SZ7.5 (GLOVE) ×2 IMPLANT
GOWN STRL REUS W/ TWL XL LVL3 (GOWN DISPOSABLE) ×1 IMPLANT
GOWN STRL REUS W/TWL XL LVL3 (GOWN DISPOSABLE) ×1
GUIDEWIRE STR DUAL SENSOR (WIRE) IMPLANT
GUIDEWIRE ZIPWRE .038 STRAIGHT (WIRE) ×2 IMPLANT
PACK CYSTO (CUSTOM PROCEDURE TRAY) ×2 IMPLANT
SET CYSTO W/LG BORE CLAMP LF (SET/KITS/TRAYS/PACK) ×2 IMPLANT
STENT URET 6FRX24 CONTOUR (STENTS) ×2 IMPLANT
TUBING CONNECTING 10 (TUBING) ×2 IMPLANT

## 2020-09-03 NOTE — Transfer of Care (Signed)
Immediate Anesthesia Transfer of Care Note  Patient: [name]  Procedure(s) Performed: CYSTOSCOPY  AND LEFT STENT PLACEMENT (Left )  Patient Location: PACU  Anesthesia Type:General  Level of Consciousness: Patient remains intubated per anesthesia plan  Airway & Oxygen Therapy: Patient placed on Ventilator (see vital sign flow sheet for setting)  Post-op Assessment: Report given to RN, Post -op Vital signs reviewed and stable and Patient moving all extremities X 4  Post vital signs: stable  Last Vitals:  Vitals Value Taken Time  BP 103/61 09/03/20 1045  Temp 36.4 C 09/03/20 1038  Pulse 77 09/03/20 1057  Resp 16 09/03/20 1057  SpO2 93 % 09/03/20 1057  Vitals shown include unvalidated device data.  Last Pain:  Vitals:   09/03/20 0805  TempSrc: Oral  PainSc:          Complications: No complications documented.

## 2020-09-03 NOTE — Plan of Care (Signed)
Pt extubated today at approx 1425.  O2 sats WDL w/4L [place], A&O, calm and relieved after ETT discontinued.  She was able to wait approx 1 hour before needing PO inatke, she has been eating ice cream and drinking water, ok to reorder diet and transfer to Stepdown per Dr Lanney Gins.  No need for pressors since arrival from PACU.

## 2020-09-03 NOTE — Anesthesia Procedure Notes (Signed)
Procedure Name: Intubation Performed by: Gunnar Fusi, MD Pre-anesthesia Checklist: Patient identified, Patient being monitored, Timeout performed, Emergency Drugs available and Suction available Patient Re-evaluated:Patient Re-evaluated prior to induction Oxygen Delivery Method: Circle system utilized Preoxygenation: Pre-oxygenation with 100% oxygen Induction Type: IV induction Ventilation: Mask ventilation without difficulty Laryngoscope Size: 3 and McGraph Grade View: Grade I Tube type: Oral Tube size: 7.0 mm Number of attempts: 1 Airway Equipment and Method: Stylet Placement Confirmation: ETT inserted through vocal cords under direct vision,  positive ETCO2 and breath sounds checked- equal and bilateral Secured at: 21 cm Tube secured with: Tape Dental Injury: Teeth and Oropharynx as per pre-operative assessment

## 2020-09-03 NOTE — Progress Notes (Signed)
Pt rec'd from OR intubated and on 21mcg Phenylephrine. Pt restless and reaching for ETT.  Fentanyl and Versed given per anesthesia and CRNA.  Dr. Lanney Gins notified of pt transfer to ICU. Orders rec'd for Fentanyl gtt.  Will tx to ICU when room available. Pt awake, and responding appropriately even w/sedation.

## 2020-09-03 NOTE — Progress Notes (Signed)
[facility] for warfarin monitoring Indication: atrial fibrillation  Patient Measurements: Height: 5\' 8"  (172.7 cm) Weight: 82.9 kg (182 lb 12.2 oz) IBW/kg (Calculated) : 63.9  Vital Signs: Temp: 98.4 F (36.9 C) (04/30 0805) Temp Source: Oral (04/30 0805) BP: 78/45 (04/30 0843) Pulse Rate: 117 (04/30 0830)  Labs: Recent Labs    09/01/20 0519 09/02/20 0527 09/03/20 0501 09/03/20 0835  HGB 12.4 10.7*  --  10.8*  HCT 36.8 33.1*  --  31.6*  PLT 239 227  --  219  LABPROT 44.9* 35.3* 31.4*  --   INR 4.8* 3.5* 3.0*  --   CREATININE 2.22* 1.99*  --  2.02*    Estimated Creatinine Clearance: 26.3 mL/min (A) (by C-G formula based on SCr of 2.02 mg/dL (H)).   Medical History: Past Medical History:  Diagnosis Date  . Allergic rhinitis   . Atrial fibrillation ([place])   . CHF (congestive heart failure) ([place])   . Depression   . Hyperlipemia   . Osteoarthritis   . Renal disorder     Medications:  Scheduled:  . [MAR Hold] acetaminophen  1,000 mg Oral Q8H  . [MAR Hold] buPROPion  300 mg Oral Daily  . [MAR Hold] fluticasone  2 spray Each Nare Daily  . [MAR Hold] guaiFENesin  600 mg Oral BID  . [MAR Hold] loratadine  10 mg Oral Daily  . [MAR Hold] metoprolol tartrate  12.5 mg Oral q AM  . [MAR Hold] mometasone-formoterol  2 puff Inhalation BID  . [MAR Hold] multivitamin with minerals  1 tablet Oral Daily  . [MAR Hold] polyethylene glycol  17 g Oral Daily  . [MAR Hold] senna-docusate  1 tablet Oral BID  . [MAR Hold] simvastatin  10 mg Oral QHS  . [MAR Hold] tamsulosin  0.4 mg Oral Daily  . [MAR Hold] traMADol  50 mg Oral Daily  . [MAR Hold] vitamin B-12  250 mcg Oral Daily  . [MAR Hold] zolpidem  2.5 mg Oral QHS    Assessment: 75 y.o.femalewith medical history significant forCVA, history of A. fib on chronic anticoagulation with Coumadin, chronic diastolic dysfunction CHF, dyslipidemia and chronic kidney disease who was sent to the ER  for evaluation of shortness of breath. Warfarin was held on admission due to being admitted with a supratherapeutic INR. Her home regimen of warfarin is 2 mg daily.  4/28 INR 4.8 Dose held 4/29 INR 3.5 Dose held 4/30 INR 3.0     Goal of Therapy:  INR 2 - 3 Monitor platelets by anticoagulation protocol: Yes   Plan:   INR therapeutic but at upper end of range: continue to hold warfarin, likely should be able to resume on 5/1. Watch interaction with Cipro. Hgb 10.8  Plt 219  DDI: Cipro/Aztreonam  Daily INR to guide treatment plan  CBC at least every 3 days per protocol  Jessica Long A 09/03/2020,10:22 AM

## 2020-09-03 NOTE — Op Note (Signed)
Operative Note  Preoperative diagnosis:  1.  18 mm left UPJ calculus 2.  Urosepsis 3.  Acute renal failure  Postoperative diagnosis: 1.  18 mm left UPJ calculus 2.  Urosepsis 3.  Acute renal failure  Procedure(s): 1.  Cystoscopy with left JJ stent placement 2.  Left retrograde pyelogram with intraoperative interpretation fluoroscopic imaging  Surgeon: Ellison Hughs, MD  Assistants:  None  Anesthesia:  General  Complications:  None  EBL: Less than 5 mL  Specimens: 1.  Urine for culture and sensitivity  Drains/Catheters: 1.  6 French, 24 cm JJ stent without tether 2.  16 French Foley catheter with 10 mL of sterile water in the balloon  Intraoperative findings:   1.  Left retrograde pyelogram revealed a large filling defect within the proximal aspects of the left ureter, consistent with the obstructing stone seen on recent CT.  The left renal pelvis was uniformly dilated with no other filling defects.  There were no filling defects seen within the distal aspects of the left ureter.  2.  Purulent urine was seen draining around the stent following placement  Indication:  Jessica Long is a 75 y.o. female with an obstructing 18 mm left UPJ calculus causing urosepsis and acute renal failure.  She has been consented for the above procedures, voices understanding and wishes to proceed.  Description of procedure:  After informed consent was obtained, the patient was brought to the operating room and general endotracheal anesthesia was administered. The patient was then placed in the dorsolithotomy position and prepped and draped in the usual sterile fashion. A timeout was performed. A 23 French rigid cystoscope was then inserted into the urethral meatus and advanced into the bladder under direct vision. A complete bladder survey revealed no intravesical pathology.  A 5 French ureteral catheter was then inserted into the left ureteral orifice and a retrograde pyelogram was  obtained, with the findings listed above.  A Glidewire was then used to intubate the lumen of the ureteral catheter and was advanced up to the left renal pelvis, under fluoroscopic guidance.  The catheter was then removed, leaving the wire in place.  A 6 French, 24 cm JJ stent was then placed over the wire and into good position within the left collecting system, confirming placement via fluoroscopy.  Following stent placement, there was a large amount of purulent urine seen draining around and through the stent.  A urine specimen was collected and sent for culture.  The cystoscope was then removed.  A 16 French Foley catheter was placed.  She tolerated the procedure well and was transferred to the postanesthesia unit in stable condition.  Plan: Transfer to the intensive care for close monitoring

## 2020-09-03 NOTE — Consult Note (Signed)
Urology Consult   Physician requesting consult: Florencia Reasons, MD  Reason for consult: Left ureteral stone  History of Present Illness: Jessica Long is a 75 y.o. female with multiple medical comorbidities.  The patient was admitted on 08/31/2020 due to shortness of breath and general malaise.  Her initial work-up focused on her chest as a source of her clinical condition.  She was noted to be in acute renal failure on admission with a progressively worsening leukocytosis.  She eventually had a renal ultrasound performed on 09/02/2020 that revealed severe left-sided hydronephrosis secondary to an obstructing left UPJ calculus.  She was also noted to have a sizable nonobstructing left lower pole stone.  CT stone study confirmed the presence of left-sided hydronephrosis due to an obstructing UPJ calculus.  Currently, the patient reports low back pain and abdominal pain.  She denies nausea/vomiting fever/chills, dysuria or hematuria.  She has been struggling with incomplete bladder emptying while in the hospital and is required in and out catheterization.  She notes no voiding issues at baseline.  Currently, the patient is hypotensive with a systolic pressure in the low 70s.  No labs have been drawn this morning.  The patient denies a history of voiding or storage urinary symptoms, hematuria, UTIs, STDs, urolithiasis, GU malignancy/trauma/surgery.  Past Medical History:  Diagnosis Date  . Allergic rhinitis   . Atrial fibrillation ([place])   . CHF (congestive heart failure) ([place])   . Depression   . Hyperlipemia   . Osteoarthritis   . Renal disorder     Past Surgical History:  Procedure Laterality Date  . MASTECTOMY     right side    Current Hospital Medications:  Home Meds:  Current Meds  Medication Sig  . acetaminophen (TYLENOL) 500 MG tablet Take 1,000 mg by mouth every 8 (eight) hours.  Marland Kitchen buPROPion (WELLBUTRIN XL) 300 MG 24 hr tablet Take 300 mg by mouth daily.  . cetirizine (ZYRTEC) 10 MG  tablet Take 10 mg by mouth daily.  . metoprolol tartrate (LOPRESSOR) 25 MG tablet Take 12.5 mg by mouth in the morning.  . Multiple Vitamin (MULTIVITAMIN) tablet Take 1 tablet by mouth daily.  . potassium chloride SA (KLOR-CON) 20 MEQ tablet Take 20 mEq by mouth daily.  Marland Kitchen selenium sulfide (SELSUN) 1 % LOTN Apply 1 application topically daily.  . simvastatin (ZOCOR) 10 MG tablet Take 10 mg by mouth at bedtime.  . sodium chloride (OCEAN) 0.65 % SOLN nasal spray Place 2 sprays into both nostrils as needed.  . torsemide (DEMADEX) 20 MG tablet Take 60 mg by mouth daily.  . traMADol (ULTRAM) 50 MG tablet Take 50 mg by mouth daily.  . vitamin B-12 (CYANOCOBALAMIN) 250 MCG tablet Take 250 mcg by mouth daily.  Marland Kitchen warfarin (COUMADIN) 2 MG tablet Take 2 mg by mouth daily.  Marland Kitchen zolpidem (AMBIEN) 5 MG tablet Take 2.5 mg by mouth at bedtime.    Scheduled Meds: . acetaminophen  1,000 mg Oral Q8H  . buPROPion  300 mg Oral Daily  . fluticasone  2 spray Each Nare Daily  . guaiFENesin  600 mg Oral BID  . loratadine  10 mg Oral Daily  . metoprolol tartrate  12.5 mg Oral q AM  . mometasone-formoterol  2 puff Inhalation BID  . multivitamin with minerals  1 tablet Oral Daily  . polyethylene glycol  17 g Oral Daily  . senna-docusate  1 tablet Oral BID  . simvastatin  10 mg Oral QHS  . tamsulosin  0.4 mg Oral Daily  . traMADol  50 mg Oral Daily  . vitamin B-12  250 mcg Oral Daily  . zolpidem  2.5 mg Oral QHS   Continuous Infusions: . sodium chloride 75 mL/hr at 09/03/20 0754  . aztreonam 100 mL/hr at 09/03/20 0757   PRN Meds:.hydrOXYzine, ipratropium-albuterol, morphine injection, ondansetron **OR** ondansetron (ZOFRAN) IV, sodium chloride  Allergies:  Allergies  Allergen Reactions  . Amoxicillin   . Ampicillin     History reviewed. No pertinent family history.  Social History:  reports that she has quit smoking. She has quit using smokeless tobacco. She reports that she does not drink alcohol and  does not use drugs.  ROS: A complete review of systems was performed.  All systems are negative except for pertinent findings as noted.  Physical Exam:  Vital signs in last 24 hours: Temp:  [97.9 F (36.6 C)-98.4 F (36.9 C)] 98.4 F (36.9 C) (04/30 0805) Pulse Rate:  [70-116] 116 (04/30 0805) Resp:  [16-22] 20 (04/30 0805) BP: (73-133)/(47-81) 73/47 (04/30 0805) SpO2:  [92 %-99 %] 92 % (04/30 0805) Constitutional: Somewhat somnolent, but alert and oriented.  No acute distress.   Cardiovascular: Regular rate and rhythm, No JVD Respiratory: Normal respiratory effort, Lungs clear bilaterally GI: Abdomen is soft, nontender, nondistended, no abdominal masses GU: No CVA tenderness Lymphatic: No lymphadenopathy Neurologic: Grossly intact, no focal deficits Psychiatric: Normal mood and affect  Laboratory Data:  Recent Labs    09/01/20 0519 09/02/20 0527  WBC 15.8* 19.7*  HGB 12.4 10.7*  HCT 36.8 33.1*  PLT 239 227    Recent Labs    09/01/20 0519 09/02/20 0527  NA 131* 131*  K 4.0 4.0  CL 95* 97*  GLUCOSE 86 101*  BUN 35* 31*  CALCIUM 8.5* 8.6*  CREATININE 2.22* 1.99*     Results for orders placed or performed during the hospital encounter of 08/31/20 (from the past 24 hour(s))  CULTURE, BLOOD (ROUTINE X 2) w Reflex to ID Panel     Status: None (Preliminary result)   Collection Time: 09/02/20  9:10 AM   Specimen: BLOOD  Result Value Ref Range   Specimen Description BLOOD BLOOD LEFT ARM    Special Requests      BOTTLES DRAWN AEROBIC AND ANAEROBIC Blood Culture adequate volume   Culture      NO GROWTH < 24 HOURS Performed at [facility], [address], [place], [place] [address]    Report Status PENDING   CULTURE, BLOOD (ROUTINE X 2) w Reflex to ID Panel     Status: None (Preliminary result)   Collection Time: 09/02/20  9:10 AM   Specimen: BLOOD  Result Value Ref Range   Specimen Description BLOOD BLOOD LEFT FOREARM    Special Requests       BOTTLES DRAWN AEROBIC AND ANAEROBIC Blood Culture adequate volume   Culture      NO GROWTH < 24 HOURS Performed at [facility], [address] [place], [place] [address]    Report Status PENDING   Protime-INR     Status: Abnormal   Collection Time: 09/03/20  5:01 AM  Result Value Ref Range   Prothrombin Time 31.4 (H) 11.4 - 15.2 seconds   INR 3.0 (H) 0.8 - 1.2   Recent Results (from the past 240 hour(s))  Resp Panel by RT-PCR (Flu A&B, Covid) Nasopharyngeal Swab     Status: None   Collection Time: 08/31/20 11:27 AM   Specimen: Nasopharyngeal Swab;  Nasopharyngeal(NP) swabs in vial transport medium  Result Value Ref Range Status   SARS Coronavirus 2 by RT PCR NEGATIVE NEGATIVE Final    Comment: (NOTE) SARS-CoV-2 target nucleic acids are NOT DETECTED.  The SARS-CoV-2 RNA is generally detectable in upper respiratory specimens during the acute phase of infection. The lowest concentration of SARS-CoV-2 viral copies this assay can detect is 138 copies/mL. A negative result does not preclude SARS-Cov-2 infection and should not be used as the sole basis for treatment or other patient management decisions. A negative result may occur with  improper specimen collection/handling, submission of specimen other than nasopharyngeal swab, presence of viral mutation(s) within the areas targeted by this assay, and inadequate number of viral copies(<138 copies/mL). A negative result must be combined with clinical observations, patient history, and epidemiological information. The expected result is Negative.  Fact Sheet for Patients:  EntrepreneurPulse.com.au  Fact Sheet for Healthcare Providers:  IncredibleEmployment.be  This test is no t yet approved or cleared by the [place] FDA and  has been authorized for detection and/or diagnosis of SARS-CoV-2 by FDA under an Emergency Use Authorization (EUA). This EUA will remain  in effect (meaning  this test can be used) for the duration of the COVID-19 declaration under Section 564(b)(1) of the Act, 21 U.S.C.section 360bbb-3(b)(1), unless the authorization is terminated  or revoked sooner.       Influenza A by PCR NEGATIVE NEGATIVE Final   Influenza B by PCR NEGATIVE NEGATIVE Final    Comment: (NOTE) The Xpert Xpress SARS-CoV-2/FLU/RSV plus assay is intended as an aid in the diagnosis of influenza from Nasopharyngeal swab specimens and should not be used as a sole basis for treatment. Nasal washings and aspirates are unacceptable for Xpert Xpress SARS-CoV-2/FLU/RSV testing.  Fact Sheet for Patients: EntrepreneurPulse.com.au  Fact Sheet for Healthcare Providers: IncredibleEmployment.be  This test is not yet approved or cleared by the [place] FDA and has been authorized for detection and/or diagnosis of SARS-CoV-2 by FDA under an Emergency Use Authorization (EUA). This EUA will remain in effect (meaning this test can be used) for the duration of the COVID-19 declaration under Section 564(b)(1) of the Act, 21 U.S.C. section 360bbb-3(b)(1), unless the authorization is terminated or revoked.  Performed at [facility], [address] [place], [place] [address]   MRSA PCR Screening     Status: None   Collection Time: 08/31/20  1:25 PM   Specimen: Nasopharyngeal  Result Value Ref Range Status   MRSA by PCR NEGATIVE NEGATIVE Final    Comment:        The GeneXpert MRSA Assay (FDA approved for NASAL specimens only), is one component of a comprehensive MRSA colonization surveillance program. It is not intended to diagnose MRSA infection nor to guide or monitor treatment for MRSA infections. Performed at [facility], [address] [place], [place] [address]   CULTURE, BLOOD (ROUTINE X 2) w Reflex to ID Panel     Status: None (Preliminary result)   Collection Time: 09/02/20  9:10 AM   Specimen: BLOOD   Result Value Ref Range Status   Specimen Description BLOOD BLOOD LEFT ARM  Final   Special Requests   Final    BOTTLES DRAWN AEROBIC AND ANAEROBIC Blood Culture adequate volume   Culture   Final    NO GROWTH < 24 HOURS Performed at [facility], [address] [place], [place] [address]    Report Status PENDING  Incomplete  CULTURE, BLOOD (ROUTINE X 2)  w Reflex to ID Panel     Status: None (Preliminary result)   Collection Time: 09/02/20  9:10 AM   Specimen: BLOOD  Result Value Ref Range Status   Specimen Description BLOOD BLOOD LEFT FOREARM  Final   Special Requests   Final    BOTTLES DRAWN AEROBIC AND ANAEROBIC Blood Culture adequate volume   Culture   Final    NO GROWTH < 24 HOURS Performed at [facility], [address], [place], [place] [address]    Report Status PENDING  Incomplete    Renal Function: Recent Labs    08/31/20 0703 09/01/20 0519 09/02/20 0527  CREATININE 2.39* 2.22* 1.99*   Estimated Creatinine Clearance: 26.7 mL/min (A) (by C-G formula based on SCr of 1.99 mg/dL (H)).  Radiologic Imaging: US RENAL  Result Date: 09/02/2020 CLINICAL DATA:  Acute renal insufficiency EXAM: RENAL / URINARY TRACT ULTRASOUND COMPLETE COMPARISON:  CT abdomen pelvis 10/28/2010 FINDINGS: Right Kidney: Renal measurements: 10.9 x 5.5 x 4.8 cm = volume: 151 mL. Echogenicity within normal limits. No mass or hydronephrosis visualized. Left Kidney: Renal measurements: 10.7 x 6.4 x 5.6 cm = volume: 198 mL. Renal cortical thickness is preserved and cortical echogenicity is normal. There is moderate left hydronephrosis. A 19 mm terminal lower pole caliceal calculus is present. Additionally, a 17 mm obstructing calculus is seen within the right ureteropelvic junction. Bladder: Appears normal for degree of bladder distention. A right ureteral jet is identified. No left ureteral jet could be identified on this exam. Other: None. IMPRESSION: Moderate left hydronephrosis  secondary to an obstructing 17 mm calculus within the left ureteropelvic junction. Superimposed 19 mm terminal lower pole nonobstructing calculus. None Electronically Signed   By: Fidela Salisbury MD   On: 09/02/2020 02:48   DG OR UROLOGY CYSTO IMAGE ([facility] ONLY)  Result Date: 09/03/2020 There is no interpretation for this exam.  This order is for images obtained during a surgical procedure.  Please See "Surgeries" Tab for more information regarding the procedure.   CT RENAL STONE STUDY  Result Date: 09/02/2020 CLINICAL DATA:  Hydronephrosis EXAM: CT ABDOMEN AND PELVIS WITHOUT CONTRAST TECHNIQUE: Multidetector CT imaging of the abdomen and pelvis was performed following the standard protocol without IV contrast. 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 Patchy bilateral lower lobe atelectasis. Trace left pleural fluid. Hepatobiliary: 2.2 cm cyst left lobe liver. No other focal liver abnormalities. No biliary dilation. The gallbladder is decompressed. Pancreas: Unremarkable. No pancreatic ductal dilatation or surrounding inflammatory changes. Spleen: Normal in size without focal abnormality. Adrenals/Urinary Tract: There is moderate to severe left-sided hydronephrosis due to an 18 mm obstructing left UPJ calculus. There is a nonobstructing 14 mm calculus within the lower pole left kidney. Mild left renal cortical thinning. There is mild right renal cortical atrophy and scarring. No right-sided calculi or obstruction. The adrenals and bladder are unremarkable. Stomach/Bowel: No bowel obstruction or ileus. Normal appendix right lower quadrant. No bowel wall thickening or inflammatory change. Small hiatal hernia unchanged. Vascular/Lymphatic: Aortic atherosclerosis. No enlarged abdominal or pelvic lymph nodes. Reproductive: Uterus and bilateral adnexa are unremarkable. Other: No free fluid or free gas.  No abdominal wall hernia. Musculoskeletal: No acute  or destructive bony lesions. Reconstructed images demonstrate no additional findings. IMPRESSION: 1. High-grade left-sided obstructive uropathy due to an 18 mm obstructing left UPJ calculus. 2. Nonobstructing 14 mm left renal calculus. 3. Bilateral renal cortical atrophy, with prominent areas of cortical scarring in the right kidney. 4.  Patchy bibasilar atelectasis, with trace left pleural effusion. 5.  Aortic Atherosclerosis (ICD10-I70.0). Electronically Signed   By: Randa Ngo M.D.   On: 09/02/2020 19:01    I independently reviewed the above imaging studies.  Impression/Recommendation 75 year old female with sepsis secondary to an obstructing left UPJ calculus associated with acute renal failure  -The risks, benefits and alternatives of cystoscopy with left JJ stent placement was discussed with the patient.  Risks include, but are not limited to: bleeding, urinary tract infection, ureteral injury, ureteral stricture disease, chronic pain, urinary symptoms, bladder injury, stent migration, the need for nephrostomy tube placement, MI, CVA, DVT, PE and the inherent risks with general anesthesia.  The patient voices understanding and wishes to proceed.   Ellison Hughs, MD [facility] 09/03/2020, 8:17 AM

## 2020-09-03 NOTE — Consult Note (Addendum)
NAME:  Jessica Long, MRN:  [id number], DOB:  11-02-1945, LOS: 0 ADMISSION DATE:  08/31/2020, CONSULTATION DATE: 09/03/2020 REFERRING MD: Florencia Reasons MD , CHIEF COMPLAINT: Shortness of breath  History of present illness   75 year old female with significant PMH as below presenting to the ED on 08/31/2020 from [facility] with chief complaints of shortness of breath x1 week. Per ED reports, EMS reports facility obtained labs due to ongoing shortness of breath including a D-dimer which was elevated at 450 so patient was sent to the ED to rule out PE.  ED Course: On arrival to the ED, she was afebrile with blood pressure 113/59 mm Hg and pulse rate 98 beats/min.  Per ED reports, there were no focal neurological deficits; she was alert and oriented x4, and he did not complain of any abdominal pain, palpitation, nausea vomiting, fevers or chills.  Initial pertinent labs revealed Na 129, K 3.9, chloride 91, bicarb 24, glucose 107, BUN 32 creatinine 2.39, BNP 170, white count 16.7 hemoglobin 12.6 hematocrit 37.4, MCV 87.6 RDW 14.9 platelets 264.  Patient was unable to get CT angiogram due to worsening chronic kidney disease but was noted to have a supratherapeutic INR.  A CT scan of the chest without contrast was obtained and showed mild bilateral posterior basilar subsegmental atelectasis.  Moderate sized sliding-type hiatal hernia.  Mild coronary artery calcification.  She was admitted to hospitalist service for further management.  Hospital Course: During the course of her hospitalization, she was noted to have acute urinary retention requiring in and out catheterization and started on Flomax.  Last night she was noted to be hypoxic, hypotensive and tachycardic.  UA showed rare bacteria with small leukocyte.  Renal ultrasound was obtained and was positive for left-sided hydronephrosis with obstructing UPJ stone therefore she was started on antibiotics empirically.  Case was discussed with urology Dr.  Lovena Neighbours by hospitalist who recommended a CT renal stone study, and to keep the patient n.p.o. pending intervention.  Repeat CT stone study confirmed the presence of left-sided hydronephrosis due to an obstructing UPJ calculus.  Patient was taken to the OR today for cystoscopy with left JJ stent placement and left retrograde pyelogram with intraoperative interpretation fluoroscopic imaging.  PCCM consulted for urosepsis and possible requiring pressors upon return from the OR. Patient returned to the ICU intubated and mechanically ventilated now s/p extubation.   Past Medical History  Chronic atrial fibrillation on Coumadin Chronic diastolic CHF CVA Dyslipidemia CKD Osteoarthritis Hyperlipidemia  Significant Hospital Events   4/27: Admitted to hospitalist service 4/29: Became hypoxic, hypotensive and tachycardic with concerns of urosepsis.  Urology consulted for 18 mm left UPJ calculus 4/30: PCCM consulted 4/30: Status post cystoscopy with left JJ stent placement and left retrograde pyelogram with intraoperative interpretation fluoroscopic imaging.   Consults:  Allergy PCCM  Procedures:  4/30: Cystoscopy with left JJ stent placement 4/30: Intubation 4/30: Extubation @ 2:25pm  Significant Diagnostic Tests:  4/30: CT renal> high-grade left sided obstructive uropathy due to an 18 mm obstructing left UPJ calculus, nonobstructing 14 mm left renal calculus.  Bilateral renal cortical atrophy with prominent areas of cortical scarring in the right kidney. 4/29: US renal stone> moderate left hydronephrosis secondary to an obstructing 17 mm calculus with left ureteral pelvic junction.  Superimposed 19 mm terminal lower pole nonobstructing calculus. 4/27: CT Chest > mild bilateral posterior basilar subsegmental atelectasis, moderate size sliding-type hiatal hernia  Micro Data:  4/27: SARS-CoV-2 PCR>> negative 4/27: Influenza PCR>> negative 4/29: Blood  culture x2>>No growth thus far 4/30: Urine  Cx>>no growth 4/27: MRSA PCR>> negative  Antimicrobials:   Aztreonam 4/30>  OBJECTIVE  Blood pressure 98/79, pulse 75, temperature 98.7 F (37.1 C), temperature source Axillary, resp. rate 15, height _0  (1.727 m), weight 82.9 kg, SpO2 99 %.    Vent Mode: PRVC FiO2 (%):  [40 %] 40 % Set Rate:  [16 bmp] 16 bmp Vt Set:  [450 mL] 450 mL PEEP:  [5 cmH20] 5 cmH20   Intake/Output Summary (Last 24 hours) at 09/03/2020 1227 Last data filed at 09/03/2020 1030 Gross per 24 hour  Intake 2715.12 ml  Output 690 ml  Net 2025.12 ml   Filed Weights   08/31/20 0655  Weight: 82.9 kg    Physical Examination  GENERAL:year-old patient lying in the bed with no acute distress post extubation EYES: Pupils equal, round, reactive to light and accommodation. No scleral icterus. Extraocular muscles intact.  HEENT: Head atraumatic, normocephalic. Oropharynx and nasopharynx clear.  NECK:  Supple, no jugular venous distention. No thyroid enlargement, no tenderness.  LUNGS: Normal breath sounds bilaterally, no wheezing, rales,rhonchi or crepitation. No use of accessory muscles of respiration.  CARDIOVASCULAR: S1, S2 normal. No murmurs, rubs, or gallops.  ABDOMEN: Soft, tender, nondistended. Bowel sounds present. No organomegaly or mass.  EXTREMITIES: No pedal edema, cyanosis, or clubbing.  NEUROLOGIC: Cranial nerves II through XII are intact. Except speech is mildly garbled but comprehensible. Muscle strength 5/5 in all extremities. Sensation intact. Gait not checked.  PSYCHIATRIC: The patient is alert and oriented x 3.  SKIN: No obvious rash, lesion, or ulcer.   Labs/imaging that I havepersonally reviewed  (right click and "Reselect all SmartList Selections" daily)      Labs   CBC: Recent Labs  Lab 08/31/20 0703 09/01/20 0519 09/02/20 0527 09/03/20 0835  WBC 16.7* 15.8* 19.7* 19.8*  NEUTROABS 13.6*  --  16.2* 17.2*  HGB 12.6 12.4 10.7* 10.8*  HCT 37.4 36.8 33.1* 31.6*  MCV 87.6 88.9 89.5  86.8  PLT 264 239 227 203    Basic Metabolic Panel: Recent Labs  Lab 08/31/20 0703 09/01/20 0519 09/02/20 0527 09/03/20 0835  NA 129* 131* 131* 128*  K 3.9 4.0 4.0 4.1  CL 91* 95* 97* 99  CO2 _1 21*  GLUCOSE 107* 86 101* 104*  BUN 34* 35* 31* 28*  CREATININE 2.39* 2.22* 1.99* 2.02*  CALCIUM 9.0 8.5* 8.6* 8.0*  MG  --   --  2.3  --    GFR: Estimated Creatinine Clearance: 26.3 mL/min (A) (by C-G formula based on SCr of 2.02 mg/dL (H)). Recent Labs  Lab 08/31/20 0703 09/01/20 0519 09/02/20 0527 09/03/20 0835  WBC 16.7* 15.8* 19.7* 19.8*    Liver Function Tests: No results for input(s): AST, ALT, ALKPHOS, BILITOT, PROT, ALBUMIN in the last 168 hours. No results for input(s): LIPASE, AMYLASE in the last 168 hours. No results for input(s): AMMONIA in the last 168 hours.  ABG No results found for: PHART, PCO2ART, PO2ART, HCO3, TCO2, ACIDBASEDEF, O2SAT   Coagulation Profile: Recent Labs  Lab 08/31/20 0703 09/01/20 0519 09/02/20 0527 09/03/20 0501  INR 3.9* 4.8* 3.5* 3.0*    Cardiac Enzymes: No results for input(s): CKTOTAL, CKMB, CKMBINDEX, TROPONINI in the last 168 hours.  HbA1C: Hgb A1c MFr Bld  Date/Time Value Ref Range Status  07/24/2009 03:28 AM  4.6 - 6.1 % Final   5.8 (NOTE) The ADA recommends the following therapeutic goal for glycemic control related to Hgb  A1c measurement: Goal of therapy: <6.5 Hgb A1c  Reference: American Diabetes Association: Clinical Practice Recommendations 2010, Diabetes Care, 2010, 33: (Suppl  1).    CBG: No results for input(s): GLUCAP in the last 168 hours.  Review of Systems:   Unable to obtain as patient is currently intubated and mechanically ventilated.  Past Medical History  She,  has a past medical history of Allergic rhinitis, Atrial fibrillation ([place]), CHF (congestive heart failure) ([place]), Depression, Hyperlipemia, Osteoarthritis, and Renal disorder.   Surgical History    Past Surgical History:   Procedure Laterality Date  . MASTECTOMY     right side     Social History   reports that she has quit smoking. She has quit using smokeless tobacco. She reports that she does not drink alcohol and does not use drugs.   Family History   Her family history is not on file.   Allergies Allergies  Allergen Reactions  . Amoxicillin   . Ampicillin      Home Medications  Prior to Admission medications   Medication Sig Start Date End Date Taking? Authorizing Provider  acetaminophen (TYLENOL) 500 MG tablet Take 1,000 mg by mouth every 8 (eight) hours.   Yes [provider]  buPROPion (WELLBUTRIN XL) 300 MG 24 hr tablet Take 300 mg by mouth daily.   Yes [provider]  cetirizine (ZYRTEC) 10 MG tablet Take 10 mg by mouth daily.   Yes [provider]  metoprolol tartrate (LOPRESSOR) 25 MG tablet Take 12.5 mg by mouth in the morning.   Yes [provider]  Multiple Vitamin (MULTIVITAMIN) tablet Take 1 tablet by mouth daily.   Yes [provider]  potassium chloride SA (KLOR-CON) 20 MEQ tablet Take 20 mEq by mouth daily.   Yes [provider]  selenium sulfide (SELSUN) 1 % LOTN Apply 1 application topically daily.   Yes [provider]  simvastatin (ZOCOR) 10 MG tablet Take 10 mg by mouth at bedtime.   Yes [provider]  sodium chloride (OCEAN) 0.65 % SOLN nasal spray Place 2 sprays into both nostrils as needed.   Yes [provider]  torsemide (DEMADEX) 20 MG tablet Take 60 mg by mouth daily.   Yes [provider]  traMADol (ULTRAM) 50 MG tablet Take 50 mg by mouth daily.   Yes [provider]  vitamin B-12 (CYANOCOBALAMIN) 250 MCG tablet Take 250 mcg by mouth daily.   Yes [provider]  warfarin (COUMADIN) 2 MG tablet Take 2 mg by mouth daily.   Yes [provider]  zolpidem (AMBIEN) 5 MG tablet Take 2.5 mg by mouth at bedtime.   Yes [provider]    Scheduled  Meds: . acetaminophen  1,000 mg Oral Q8H  . buPROPion  300 mg Oral Daily  . chlorhexidine gluconate (MEDLINE KIT)  15 mL Mouth Rinse BID  . Chlorhexidine Gluconate Cloth  6 each Topical Daily  . fentaNYL      . fentaNYL      . fluticasone  2 spray Each Nare Daily  . guaiFENesin  600 mg Oral BID  . loratadine  10 mg Oral Daily  . mouth rinse  15 mL Mouth Rinse 10 times per day  . metoprolol tartrate  12.5 mg Oral q AM  . midazolam      . mometasone-formoterol  2 puff Inhalation BID  . multivitamin with minerals  1 tablet Oral Daily  . phenylephrine      .  polyethylene glycol  17 g Oral Daily  . senna-docusate  1 tablet Oral BID  . simvastatin  10 mg Oral QHS  . tamsulosin  0.4 mg Oral Daily  . traMADol  50 mg Oral Daily  . vitamin B-12  250 mcg Oral Daily  . zolpidem  2.5 mg Oral QHS   Continuous Infusions: . sodium chloride 75 mL/hr at 09/03/20 0754  . aztreonam 100 mL/hr at 09/03/20 0757  . ciprofloxacin    . fentaNYL infusion INTRAVENOUS 50 mcg/hr (09/03/20 1154)   PRN Meds:.hydrOXYzine, ipratropium-albuterol, morphine injection, ondansetron **OR** ondansetron (ZOFRAN) IV, sodium chloride   Active Hospital Problem list   Urosepsis AKI Hyponatremia Chronic diastolic CHF Chronic atrial fibrillation  Assessment & Plan:   Urosepsis secondary to an Obstructing Left UPJ Calculus S/p cystoscopy with left JJ stent placement POD # 0 - Supplemental oxygen as needed, to maintain SpO2 > 90% - F/u blood and Urine cultures, trend lactic/ PCT - monitor WBC/ fever curve - IV antibiotics:  - IVF hydration as needed - Will consider vasopressors to maintain MAP> 65 - Strict I/O's - Urology following, input appreciated     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 - postrenal due to Obstructing Left UPJ Calculus - Monitor I&O's / urinary output - Follow BMP - Ensure adequate renal perfusion - Avoid nephrotoxic agents as able - Replace electrolytes as indicated     Hypovolemic Hyponatremia   Na 128 - Check cortisol, TSH,  - Follow serum osmolality, sodium osmolality, serial sodium, urine sodium - Hold diurectic - Gentle IVFs with NS with goal serum sodium level not > 10 to 12 mEq per L in the first 24 hours and 18 mEq per L in the first 48 hours   Chronic Atrial Fibrillation - Rate controlled - Resume coumadin once INR stable - Follow INR   Chronic HFpEF (last known EF 60 to 65%) - Continuous cardiac monitoring - Maintain MAP greater than 65 - Hold Diuretics in the setting of above - Hold BP meds    Best practice:  Diet:  Oral Pain/Anxiety/Delirium protocol (if indicated): Yes (RASS goal 0) VAP protocol (if indicated): Not indicated DVT prophylaxis: Systemic AC GI prophylaxis: PPI Glucose control:  SSI No Central venous access:  N/A Arterial line:  N/A Foley:  N/A Mobility:  bed rest  PT consulted: N/A Last date of multidisciplinary goals of care discussion [4/30] Code Status:  full code Disposition: ICU  Critical care time: [place], FNP-C, AGACNP-BC Acute Care Nurse Practitioner  [facility] Pager: [phone] [place] [facility]  .

## 2020-09-03 NOTE — Progress Notes (Signed)
Assisted with patient transport from PACU to ICU with no complications. Patient was more alert, placed into PSV once settled in room and Dr. Lanney Gins notified as well Leeann Must.

## 2020-09-03 NOTE — Progress Notes (Signed)
HR fluctuating 120s- 140s, O2 increased from 0.5L to 6 was at 87% currently 92%. Scheduled Metoprolol 12.5 given MD Damita Dunnings notified. No new orders at this time. Gave report to OR nurse.

## 2020-09-03 NOTE — Anesthesia Preprocedure Evaluation (Addendum)
Anesthesia Evaluation  Patient identified by MRN, date of birth, ID band Patient awake    Reviewed: Allergy & Precautions, NPO status , Patient's Chart, lab work & pertinent test results  History of Anesthesia Complications Negative for: history of anesthetic complications  Airway Mallampati: III       Dental  (+) Poor Dentition, Chipped, Missing, Loose   Pulmonary shortness of breath, neg sleep apnea, COPD,  COPD inhaler, Not current smoker, former smoker,           Cardiovascular (-) hypertension+CHF  (-) Past MI + dysrhythmias Atrial Fibrillation (-) Valvular Problems/Murmurs     Neuro/Psych neg Seizures Depression    GI/Hepatic Neg liver ROS, GERD  ,  Endo/Other  neg diabetes  Renal/GU Renal InsufficiencyRenal disease (stones)     Musculoskeletal   Abdominal   Peds  Hematology   Anesthesia Other Findings   Reproductive/Obstetrics                             Anesthesia Physical Anesthesia Plan  ASA: III and emergent  Anesthesia Plan: General   Post-op Pain Management:    Induction: Intravenous  PONV Risk Score and Plan: 3 and Ondansetron and Dexamethasone  Airway Management Planned: Oral ETT  Additional Equipment:   Intra-op Plan:   Post-operative Plan: Possible Post-op intubation/ventilation  Informed Consent: I have reviewed the patients History and Physical, chart, labs and discussed the procedure including the risks, benefits and alternatives for the proposed anesthesia with the patient or authorized representative who has indicated his/her understanding and acceptance.       Plan Discussed with:   Anesthesia Plan Comments:        Anesthesia Quick Evaluation

## 2020-09-03 NOTE — Progress Notes (Signed)
VS taken at shift change, BP trending down 73/47, O2 sats trending down 92 on 6L, patient lethargic but oriented to person and place and situation.  Flushed but afebrile.  Urine output adequate.    Dr. Gilford Rile explained procedure, consent signed, family member called and informed of procedure.  Bolus started and patient transported to OR

## 2020-09-03 NOTE — Progress Notes (Signed)
PROGRESS NOTE    Jessica Long  QBH:[id number] DOB: [birth year] DOA: 08/31/2020 PCP: Orvis Brill, [facility]    Chief Complaint  Patient presents with  . Shortness of Breath    Brief Narrative:  Jessica Long is a 75 y.o. female with medical history significant for CVA, history of A. fib on chronic anticoagulation with Coumadin, chronic diastolic dysfunction CHF, dyslipidemia and chronic kidney disease who was sent to the ER for evaluation of shortness of breath. Patient states she has had symptoms for 3 weeks, states that she is short of breath at rest but worse with exertion.  She has had an occasional cough productive of clear phlegm  Subjective:  Overnight, she became hypoxic, hypotensive, tachycardic She reports feeling weak, aaox3, no fever She is to go to OR this am   Assessment & Plan:   Principal Problem:   Shortness of breath Active Problems:   AKI (acute kidney injury) ([place])   Atrial fibrillation (HCC)   Depression   COPD (chronic obstructive pulmonary disease) with emphysema (HCC)   Obstructive left UPJ stone with left hydronephrosis/acute urinary retention/ likely developing sepsis/septic shock -she presents with progressive Sob/weakness for the last three weeks, initial work around respiratory which was unrevealing, renal US done due to [facility] showed left hydronephrosis and obstructive left upj stone,  -Fluid bolus ordered -urology consulted , she is to have left JJ stent placement -case discussed with critical care as well -updated patient and family    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 Fib -Rate controlled on metoprolol, on Coumadin chronically, currently on hold due to supratherapeutic INR  Supratherapeutic INR INR 4.8 Likely due to poor oral intake recently No sign of bleeding Hold Coumadin Repeat INR in the morning  H/o CVA, affected balance, no ipsilateral weakness Continue statin, on chronic coumadin  Reports walks with a walker for several years, denies recent falls  Chronic diastolic CHF Appears dry, hold diuretics, continue gentle hydration, close monitor volume status  Hyperlipidemia Continue Zocor  Moderate size sliding-type hiatal hernia.  New diagnosis Up at 62 degreee with oral intake Start ppi   emphysema Prior smoker, no wheezing on exam  Chronic knee pain -on tylenol 1000mg  tid and tramadol daily at home, continue  Constipation: Start MiraLAX and Senokot    FTT: from ALF, She used to walk twice a day, recently has not walked due to sob and weakness  PT/OT recommend SNF   Nutritional Assessment: The patient's BMI is: Body mass index is 27.79 kg/m.Marland Kitchen Seen by dietician.  I agree with the assessment and plan as outlined below:      Unresulted Labs (From admission, onward)          Start     Ordered   09/04/20 0500  CBC with Differential/Platelet  Tomorrow morning,   R        09/03/20 0821   09/04/20 0973  Basic metabolic panel  Daily,   R      09/03/20 0821   09/01/20 0500  Protime-INR  Daily,   STAT      08/31/20 1210  DVT prophylaxis: Supratherapeutic INR, on chronic Coumadin   Code Status: Full Family Communication: niece at bedside on 4/28, niece over the phone on 4/29 and 4/30 Disposition:   Dispo: The patient is from: ALF              Anticipated d/c is to: SNF              Anticipated d/c date is: Not medically stable to discharge                Consultants:   Urology, Dr. Lovena Neighbours  Critical care  Procedures:   In and  out cath for urinary retention    Antimicrobials:   Anti-infectives (From admission, onward)   Start     Dose/Rate Route Frequency Ordered Stop   09/03/20 0930  ciprofloxacin (CIPRO) IVPB 200 mg       Note to Pharmacy: Give pre-op hour   200 mg 100 mL/hr over 60 Minutes Intravenous Every 12 hours 09/03/20 0830     09/02/20 2000  aztreonam (AZACTAM) 0.5 g in dextrose 5 % 50 mL IVPB        0.5 g 100 mL/hr over 30 Minutes Intravenous Every 8 hours 09/02/20 1843            Objective: Vitals:   09/03/20 0342 09/03/20 0805 09/03/20 0830 09/03/20 0843  BP: 133/81 (!) 73/47 (!) 76/53 (!) 78/45  Pulse: 99 (!) 116 (!) 117   Resp: (!) 22 20    Temp: 98 F (36.7 C) 98.4 F (36.9 C)    TempSrc: Oral Oral    SpO2: 99% 92%    Weight:      Height:        Intake/Output Summary (Last 24 hours) at 09/03/2020 0910 Last data filed at 09/03/2020 0543 Gross per 24 hour  Intake 1415.12 ml  Output 690 ml  Net 725.12 ml   Filed Weights   08/31/20 0655  Weight: 82.9 kg    Examination:  General exam: frail,   weak, no resting tremor today, aaox3 Respiratory system: Clear to auscultation. No wheezing, no rales, no rhonchi  Cardiovascular system: S1 & S2 heard, RRR. No JVD, no murmur, No pedal edema. Gastrointestinal system: Abdomen is nondistended, soft and nontender. Normal bowel sounds heard. Central nervous system: Alert and oriented. No focal neurological deficits. Extremities: generalized weakness, nonfocal Skin: No rashes, lesions or ulcers Psychiatry: Judgement and insight appear normal. Mood & affect appropriate.     Data Reviewed: I have personally reviewed following labs and imaging studies  CBC: Recent Labs  Lab 08/31/20 0703 09/01/20 0519 09/02/20 0527 09/03/20 0835  WBC 16.7* 15.8* 19.7* 19.8*  NEUTROABS 13.6*  --  16.2* PENDING  HGB 12.6 12.4 10.7* 10.8*  HCT 37.4 36.8 33.1* 31.6*  MCV 87.6 88.9 89.5 86.8  PLT 264 239 227 950    Basic Metabolic  Panel: Recent Labs  Lab 08/31/20 0703 09/01/20 0519 09/02/20 0527 09/03/20 0835  NA 129* 131* 131* 128*  K 3.9 4.0 4.0 4.1  CL 91* 95* 97* 99  CO2 24 24 25  21*  GLUCOSE 107* 86 101* 104*  BUN 34* 35* 31* 28*  CREATININE 2.39* 2.22* 1.99* 2.02*  CALCIUM 9.0 8.5* 8.6* 8.0*  MG  --   --  2.3  --     GFR: Estimated Creatinine Clearance: 26.3 mL/min (A) (by C-G formula based on SCr of 2.02 mg/dL (H)).  Liver Function Tests: No results for input(s): AST, ALT, ALKPHOS, BILITOT, PROT,  ALBUMIN in the last 168 hours.  CBG: No results for input(s): GLUCAP in the last 168 hours.   Recent Results (from the past 240 hour(s))  Resp Panel by RT-PCR (Flu A&B, Covid) Nasopharyngeal Swab     Status: None   Collection Time: 08/31/20 11:27 AM   Specimen: Nasopharyngeal Swab; Nasopharyngeal(NP) swabs in vial transport medium  Result Value Ref Range Status   SARS Coronavirus 2 by RT PCR NEGATIVE NEGATIVE Final    Comment: (NOTE) SARS-CoV-2 target nucleic acids are NOT DETECTED.  The SARS-CoV-2 RNA is generally detectable in upper respiratory specimens during the acute phase of infection. The lowest concentration of SARS-CoV-2 viral copies this assay can detect is 138 copies/mL. A negative result does not preclude SARS-Cov-2 infection and should not be used as the sole basis for treatment or other patient management decisions. A negative result may occur with  improper specimen collection/handling, submission of specimen other than nasopharyngeal swab, presence of viral mutation(s) within the areas targeted by this assay, and inadequate number of viral copies(<138 copies/mL). A negative result must be combined with clinical observations, patient history, and epidemiological information. The expected result is Negative.  Fact Sheet for Patients:  EntrepreneurPulse.com.au  Fact Sheet for Healthcare Providers:  IncredibleEmployment.be  This test is no t  yet approved or cleared by the [place] FDA and  has been authorized for detection and/or diagnosis of SARS-CoV-2 by FDA under an Emergency Use Authorization (EUA). This EUA will remain  in effect (meaning this test can be used) for the duration of the COVID-19 declaration under Section 564(b)(1) of the Act, 21 U.S.C.section 360bbb-3(b)(1), unless the authorization is terminated  or revoked sooner.       Influenza A by PCR NEGATIVE NEGATIVE Final   Influenza B by PCR NEGATIVE NEGATIVE Final    Comment: (NOTE) The Xpert Xpress SARS-CoV-2/FLU/RSV plus assay is intended as an aid in the diagnosis of influenza from Nasopharyngeal swab specimens and should not be used as a sole basis for treatment. Nasal washings and aspirates are unacceptable for Xpert Xpress SARS-CoV-2/FLU/RSV testing.  Fact Sheet for Patients: EntrepreneurPulse.com.au  Fact Sheet for Healthcare Providers: IncredibleEmployment.be  This test is not yet approved or cleared by the [place] FDA and has been authorized for detection and/or diagnosis of SARS-CoV-2 by FDA under an Emergency Use Authorization (EUA). This EUA will remain in effect (meaning this test can be used) for the duration of the COVID-19 declaration under Section 564(b)(1) of the Act, 21 U.S.C. section 360bbb-3(b)(1), unless the authorization is terminated or revoked.  Performed at [facility], [address] [place], [place] [address]   MRSA PCR Screening     Status: None   Collection Time: 08/31/20  1:25 PM   Specimen: Nasopharyngeal  Result Value Ref Range Status   MRSA by PCR NEGATIVE NEGATIVE Final    Comment:        The GeneXpert MRSA Assay (FDA approved for NASAL specimens only), is one component of a comprehensive MRSA colonization surveillance program. It is not intended to diagnose MRSA infection nor to guide or monitor treatment for MRSA infections. Performed at [facility], [address] [place], [place] [address]   Urine Culture     Status: Abnormal (Preliminary result)   Collection Time: 09/01/20  3:15 PM   Specimen: Urine, Random  Result Value Ref Range Status   Specimen Description   Final    URINE, RANDOM Performed at [facility], [address],  [place], [place] [address]    Special Requests   Final    NONE Performed at [facility], [address] [place], [place] [address]    Culture (A)  Final    >=100,000 COLONIES/mL Lonell Grandchild NEGATIVE RODS SUSCEPTIBILITIES TO FOLLOW Performed at [facility], [place] [address], [place], [place] [address]    Report Status PENDING  Incomplete  CULTURE, BLOOD (ROUTINE X 2) w Reflex to ID Panel     Status: None (Preliminary result)   Collection Time: 09/02/20  9:10 AM   Specimen: BLOOD  Result Value Ref Range Status   Specimen Description BLOOD BLOOD LEFT ARM  Final   Special Requests   Final    BOTTLES DRAWN AEROBIC AND ANAEROBIC Blood Culture adequate volume   Culture   Final    NO GROWTH < 24 HOURS Performed at [facility], [address], [place], [place] [address]    Report Status PENDING  Incomplete  CULTURE, BLOOD (ROUTINE X 2) w Reflex to ID Panel     Status: None (Preliminary result)   Collection Time: 09/02/20  9:10 AM   Specimen: BLOOD  Result Value Ref Range Status   Specimen Description BLOOD BLOOD LEFT FOREARM  Final   Special Requests   Final    BOTTLES DRAWN AEROBIC AND ANAEROBIC Blood Culture adequate volume   Culture   Final    NO GROWTH < 24 HOURS Performed at [facility], [address], [place],  [address]    Report Status PENDING  Incomplete         Radiology Studies: US RENAL  Result Date: 09/02/2020 CLINICAL DATA:  Acute renal insufficiency EXAM: RENAL / URINARY TRACT ULTRASOUND COMPLETE COMPARISON:  CT abdomen pelvis 10/28/2010 FINDINGS: Right Kidney: Renal measurements: 10.9 x 5.5 x 4.8 cm = volume: 151  mL. Echogenicity within normal limits. No mass or hydronephrosis visualized. Left Kidney: Renal measurements: 10.7 x 6.4 x 5.6 cm = volume: 198 mL. Renal cortical thickness is preserved and cortical echogenicity is normal. There is moderate left hydronephrosis. A 19 mm terminal lower pole caliceal calculus is present. Additionally, a 17 mm obstructing calculus is seen within the right ureteropelvic junction. Bladder: Appears normal for degree of bladder distention. A right ureteral jet is identified. No left ureteral jet could be identified on this exam. Other: None. IMPRESSION: Moderate left hydronephrosis secondary to an obstructing 17 mm calculus within the left ureteropelvic junction. Superimposed 19 mm terminal lower pole nonobstructing calculus. None Electronically Signed   By: Fidela Salisbury MD   On: 09/02/2020 02:48   DG OR UROLOGY CYSTO IMAGE ([facility] ONLY)  Result Date: 09/03/2020 There is no interpretation for this exam.  This order is for images obtained during a surgical procedure.  Please See "Surgeries" Tab for more information regarding the procedure.   CT RENAL STONE STUDY  Result Date: 09/02/2020 CLINICAL DATA:  Hydronephrosis EXAM: CT ABDOMEN AND PELVIS WITHOUT CONTRAST TECHNIQUE: Multidetector CT imaging of the abdomen and pelvis was performed following the standard protocol without IV contrast. 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 Patchy bilateral lower lobe atelectasis. Trace left pleural fluid. Hepatobiliary: 2.2 cm cyst left lobe liver. No other focal liver abnormalities. No biliary dilation. The gallbladder is decompressed. Pancreas: Unremarkable. No pancreatic ductal dilatation or surrounding inflammatory changes. Spleen: Normal in size without focal abnormality. Adrenals/Urinary Tract: There is moderate to severe left-sided hydronephrosis due to an 18 mm obstructing left UPJ calculus. There  is a nonobstructing 14 mm  calculus within the lower pole left kidney. Mild left renal cortical thinning. There is mild right renal cortical atrophy and scarring. No right-sided calculi or obstruction. The adrenals and bladder are unremarkable. Stomach/Bowel: No bowel obstruction or ileus. Normal appendix right lower quadrant. No bowel wall thickening or inflammatory change. Small hiatal hernia unchanged. Vascular/Lymphatic: Aortic atherosclerosis. No enlarged abdominal or pelvic lymph nodes. Reproductive: Uterus and bilateral adnexa are unremarkable. Other: No free fluid or free gas.  No abdominal wall hernia. Musculoskeletal: No acute or destructive bony lesions. Reconstructed images demonstrate no additional findings. IMPRESSION: 1. High-grade left-sided obstructive uropathy due to an 18 mm obstructing left UPJ calculus. 2. Nonobstructing 14 mm left renal calculus. 3. Bilateral renal cortical atrophy, with prominent areas of cortical scarring in the right kidney. 4. Patchy bibasilar atelectasis, with trace left pleural effusion. 5.  Aortic Atherosclerosis (ICD10-I70.0). Electronically Signed   By: Randa Ngo M.D.   On: 09/02/2020 19:01        Scheduled Meds: . acetaminophen  1,000 mg Oral Q8H  . buPROPion  300 mg Oral Daily  . fluticasone  2 spray Each Nare Daily  . guaiFENesin  600 mg Oral BID  . loratadine  10 mg Oral Daily  . metoprolol tartrate  12.5 mg Oral q AM  . mometasone-formoterol  2 puff Inhalation BID  . multivitamin with minerals  1 tablet Oral Daily  . polyethylene glycol  17 g Oral Daily  . senna-docusate  1 tablet Oral BID  . simvastatin  10 mg Oral QHS  . tamsulosin  0.4 mg Oral Daily  . traMADol  50 mg Oral Daily  . vitamin B-12  250 mcg Oral Daily  . zolpidem  2.5 mg Oral QHS   Continuous Infusions: . sodium chloride 75 mL/hr at 09/03/20 0754  . aztreonam 100 mL/hr at 09/03/20 0757  . ciprofloxacin       LOS: 0 days   Time spent: 19mins Greater than 50% of this time was spent in  counseling, explanation of diagnosis, planning of further management, and coordination of care.   Voice Recognition Viviann Spare dictation system was used to create this note, attempts have been made to correct errors. Please contact the author with questions and/or clarifications.   Florencia Reasons, MD PhD FACP [facility]  Available via Epic secure chat 7am-7pm for nonurgent issues Please page for urgent issues To page the attending provider between 7A-7P or the covering provider during after hours 7P-7A, please log into the web site [email or web address] and access using universal [place] password for that web site. If you do not have the password, please call the hospital operator.    09/03/2020, 9:10 AM

## 2020-09-03 NOTE — Progress Notes (Signed)
Patient extubated to room air per MD order. No complications noted and saturation 100% at this time.

## 2020-09-03 NOTE — Progress Notes (Signed)
Patient complained about R hip pain and lower abdominal pain 10/10 at 4am, was tremoring. Encouraged to deep breathe to calm down. VS stable. Bladder scan showed 317 ml, patient unable to void, did in and out per standing order urine >275 ml and 400 ml came out, 0 ml postresidual. Message Dr. Damita Dunnings, ordered morphine 2 mg IV which brought pain down to a 7/10.

## 2020-09-03 NOTE — Progress Notes (Signed)
OT Cancellation Note  Patient Details Name: Jessica Long MRN: [id number] DOB: 04/22/1946   Cancelled Treatment:    Reason Eval/Treat Not Completed: Medical issues which prohibited therapy  Pt transferred to ICU after urology procedure. Will require new OT orders to resume services should they be warranted, post-operatively. Will complete original order at this time and stand by for new orders. Thank you.  Gerrianne Scale, [place], OTR/L ascom [phone] 09/03/20, 5:10 PM

## 2020-09-03 NOTE — TOC Progression Note (Signed)
Transition of Care [facility]) - Progression Note    Patient Details  Name: KAYTELYNN SCRIPTER MRN: [id number] Date of Birth: [birth year]  Transition of Care [facility]) CM/SW [place], LCSW Phone Number: 09/03/2020, 9:32 AM  Clinical Narrative:   Reached out to Tammy with [facility] and asked her to review referral. Attempted to reach [facility] as well, unable to reach anyone.    Expected Discharge Plan: [place] Barriers to Discharge: Continued Medical Work up  Expected Discharge Plan and Services Expected Discharge Plan: [place] arrangements for the past 2 months: [place]                                       Social Determinants of Health (SDOH) Interventions    Readmission Risk Interventions No flowsheet data found.

## 2020-09-04 ENCOUNTER — Encounter: Payer: Self-pay | Admitting: Internal Medicine

## 2020-09-04 ENCOUNTER — Inpatient Hospital Stay: Payer: Medicare Other

## 2020-09-04 DIAGNOSIS — R0602 Shortness of breath: Secondary | ICD-10-CM | POA: Diagnosis not present

## 2020-09-04 LAB — CBC WITH DIFFERENTIAL/PLATELET
Abs Immature Granulocytes: 0.95 10*3/uL — ABNORMAL HIGH (ref 0.00–0.07)
Basophils Absolute: 0.1 10*3/uL (ref 0.0–0.1)
Basophils Relative: 0 %
Eosinophils Absolute: 0.1 10*3/uL (ref 0.0–0.5)
Eosinophils Relative: 0 %
HCT: 29.3 % — ABNORMAL LOW (ref 36.0–46.0)
Hemoglobin: 9.8 g/dL — ABNORMAL LOW (ref 12.0–15.0)
Immature Granulocytes: 4 %
Lymphocytes Relative: 5 %
Lymphs Abs: 1.1 10*3/uL (ref 0.7–4.0)
MCH: 29.3 pg (ref 26.0–34.0)
MCHC: 33.4 g/dL (ref 30.0–36.0)
MCV: 87.7 fL (ref 80.0–100.0)
Monocytes Absolute: 0.5 10*3/uL (ref 0.1–1.0)
Monocytes Relative: 2 %
Neutro Abs: 19 10*3/uL — ABNORMAL HIGH (ref 1.7–7.7)
Neutrophils Relative %: 89 %
Platelets: 229 10*3/uL (ref 150–400)
RBC: 3.34 MIL/uL — ABNORMAL LOW (ref 3.87–5.11)
RDW: 15.3 % (ref 11.5–15.5)
Smear Review: NORMAL
WBC: 21.7 10*3/uL — ABNORMAL HIGH (ref 4.0–10.5)
nRBC: 0 % (ref 0.0–0.2)

## 2020-09-04 LAB — URINE CULTURE: Culture: >=100000 — AB

## 2020-09-04 LAB — BASIC METABOLIC PANEL
Anion gap: 7 (ref 5–15)
BUN: 33 mg/dL — ABNORMAL HIGH (ref 8–23)
CO2: 22 mmol/L (ref 22–32)
Calcium: 8.5 mg/dL — ABNORMAL LOW (ref 8.9–10.3)
Chloride: 102 mmol/L (ref 98–111)
Creatinine, Ser: 1.6 mg/dL — ABNORMAL HIGH (ref 0.44–1.00)
GFR, Estimated: 33 mL/min — ABNORMAL LOW (ref >60)
Glucose, Bld: 131 mg/dL — ABNORMAL HIGH (ref 70–99)
Potassium: 4.6 mmol/L (ref 3.5–5.1)
Sodium: 131 mmol/L — ABNORMAL LOW (ref 135–145)

## 2020-09-04 LAB — PHOSPHORUS: Phosphorus: 3.7 mg/dL (ref 2.5–4.6)

## 2020-09-04 LAB — PROTIME-INR
INR: 2.3 — ABNORMAL HIGH (ref 0.8–1.2)
Prothrombin Time: 25.5 seconds — ABNORMAL HIGH (ref 11.4–15.2)

## 2020-09-04 LAB — MAGNESIUM: Magnesium: 2.3 mg/dL (ref 1.7–2.4)

## 2020-09-04 MED ORDER — WARFARIN - PHARMACIST DOSING INPATIENT: Freq: Every day | Status: DC

## 2020-09-04 MED ORDER — FAMOTIDINE 20 MG PO TABS
10.0000 mg | ORAL_TABLET | Freq: Every day | ORAL | Status: DC
  Administered 2020-09-04 – 2020-09-05 (×2): 10 mg via ORAL
  Filled 2020-09-04 (×2): qty 1

## 2020-09-04 MED ORDER — METOPROLOL TARTRATE 25 MG PO TABS
12.5000 mg | ORAL_TABLET | Freq: Two times a day (BID) | ORAL | Status: DC
  Administered 2020-09-04 – 2020-09-06 (×4): 12.5 mg via ORAL
  Filled 2020-09-04 (×4): qty 1

## 2020-09-04 MED ORDER — SODIUM CHLORIDE 0.9 % IV SOLN
1.0000 g | Freq: Three times a day (TID) | INTRAVENOUS | Status: DC
  Administered 2020-09-04: 1 g via INTRAVENOUS
  Filled 2020-09-04 (×2): qty 1

## 2020-09-04 MED ORDER — AEROCHAMBER PLUS FLO-VU MEDIUM MISC
1.0000 | Freq: Once | Status: AC
  Administered 2020-09-04: 1
  Filled 2020-09-04: qty 1

## 2020-09-04 MED ORDER — CIPROFLOXACIN HCL 500 MG PO TABS
500.0000 mg | ORAL_TABLET | Freq: Two times a day (BID) | ORAL | Status: DC
  Administered 2020-09-04 – 2020-09-06 (×4): 500 mg via ORAL
  Filled 2020-09-04 (×5): qty 1

## 2020-09-04 MED ORDER — FUROSEMIDE 10 MG/ML IJ SOLN
20.0000 mg | Freq: Once | INTRAMUSCULAR | Status: AC
  Administered 2020-09-04: 20 mg via INTRAVENOUS
  Filled 2020-09-04: qty 4

## 2020-09-04 MED ORDER — FUROSEMIDE 10 MG/ML IJ SOLN
20.0000 mg | Freq: Once | INTRAMUSCULAR | Status: AC
  Administered 2020-09-04: 20 mg via INTRAVENOUS
  Filled 2020-09-04: qty 2

## 2020-09-04 MED ORDER — WARFARIN SODIUM 2 MG PO TABS
2.0000 mg | ORAL_TABLET | Freq: Once | ORAL | Status: AC
  Administered 2020-09-04: 2 mg via ORAL
  Filled 2020-09-04: qty 1

## 2020-09-04 NOTE — TOC Progression Note (Signed)
Transition of Care [facility]) - Progression Note    Patient Details  Name: Jessica Long MRN: [id number] Date of Birth: February 27, 1946  Transition of Care [facility]) CM/SW [place], [place] Phone Number: [phone] 09/04/2020, 2:25 PM  Clinical Narrative:     Patient has bed offer at [facility], confirmed by Tammy. Patient moved from ICU to room 141.  CSW updated TOC CSW covering room 141.  Expected Discharge Plan: [place] Barriers to Discharge: Continued Medical Work up  Expected Discharge Plan and Services Expected Discharge Plan: [place] arrangements for the past 2 months: [place]                                       Social Determinants of Health (SDOH) Interventions    Readmission Risk Interventions No flowsheet data found.

## 2020-09-04 NOTE — Progress Notes (Addendum)
PROGRESS NOTE    Jessica Long  ZOX:[id number] DOB: 1945/07/10 DOA: 08/31/2020 PCP: Orvis Brill, [facility]    Chief Complaint  Patient presents with  . Shortness of Breath    Brief Narrative:  Jessica Long is a 75 y.o. female with medical history significant for CVA, history of A. fib on chronic anticoagulation with Coumadin, chronic diastolic dysfunction CHF, dyslipidemia and chronic kidney disease who was sent to the ER for evaluation of shortness of breath. Patient states she has had symptoms for 3 weeks, states that she is short of breath at rest but worse with exertion.  She has had an occasional cough productive of clear phlegm   initial work around respiratory which was unrevealing, she is found to have left hydronephrosis from obstructing stone and developed septic shock , s/p stent placement, improving   Subjective:  She had a stent placed in yesterday , was sent to icu post procedure, she has improved and  transferred out of the ICU today She reports continued sob, reports some swelling in feet and legs , reports  productive cough,  reports flonase helped  nasal congestion She denies ab pain, no back pain, she reports right side  hip pain  Which is chronic  No fever   Assessment & Plan:   Principal Problem:   Shortness of breath Active Problems:   AKI (acute kidney injury) ([place])   Atrial fibrillation ([facility])   Depression   COPD (chronic obstructive pulmonary disease) with emphysema ([place])   Hydronephrosis   Obstructive left UPJ stone with left hydronephrosis/acute urinary retention/ AKI/likely developing sepsis/septic shock -she presents with progressive Sob/weakness for the last three weeks, initial work around respiratory which was unrevealing, renal US done due to [facility] showed left hydronephrosis and obstructive left upj stone, CT renal stone study confirmed finding --She developed septic shock on 4/30 AM with hypotension, hypoxia, tachycardia, she received  fluids resuscitation, ICU consulted -s/p left JJ stent placement on 4/30 am, "a large amount of purulent urine seen draining around and through the stent" culture sent -she is transferred to icu post op, she was successfully extubated around 6 PM on 4/30, she did not require pressors, she is transferred out of ICU on 5/1 -empirically Started on aztreonam on 4/29( h/o ampicillin allergy), urine culture Proteus sensitive to Cipro, case discussed with pharmacy, change antibiotic to Cipro, pharmacy to dose -Appreciate urology and critical care input, will follow recommendation    Hyponatremia -initially appear dry, diuretic held, now appear volume overloaded, prn lasix - repeat BMP in the morning   Paroxysmal A. Fib -Rate controlled on metoprolol, on Coumadin chronically, present with supratherapeutic INR .Coumadin held since admission , resuming on 5/1  Supratherapeutic INR INR 4.8 on presentation Likely due to poor oral intake and sepsis, No sign of bleeding Coumadin held since admission, INR this morning 2.3 Coumadin dosing per pharmacy  Chronic diastolic CHF  initially appear dry, diuretic held, now appear volume overloaded, prn lasix close monitor volume status   H/o CVA, affected balance, no ipsilateral weakness Continue statin, on chronic coumadin  Reports walks with a walker for several years, denies recent falls  Hyperlipidemia Continue Zocor  Moderate size sliding-type hiatal hernia.  New diagnosis Up at 92 degreee with oral intake Started on pepcid   emphysema New findings on CT chest, no prior diagnosis Prior smoker, no wheezing on exam  Chronic knee pain -on tylenol 1000mg  tid and tramadol daily at home, continue  Constipation: Start MiraLAX and Senokot  FTT: from ALF, She used to walk twice a day, recently has not walked due to sob and weakness  PT/OT recommend SNF    The patient's BMI is: Body mass index is 27.79 kg/m.Marland Kitchen       Unresulted Labs  (From admission, onward)          Start     Ordered   09/05/20 0500  CBC with Differential/Platelet  Tomorrow morning,   R       Question:  Specimen collection method  Answer:  Lab=Lab collect   09/04/20 0853   09/05/20 0500  Magnesium  Tomorrow morning,   R       Question:  Specimen collection method  Answer:  Lab=Lab collect   09/04/20 1905   09/05/20 0500  Brain natriuretic peptide  Tomorrow morning,   R       Question:  Specimen collection method  Answer:  Lab=Lab collect   09/04/20 1905   09/04/20 1846  Expectorated Sputum Assessment w Gram Stain, Rflx to Resp Cult  Once,   R        09/04/20 1845   09/04/20 1448  Basic metabolic panel  Daily,   R      09/03/20 0821   09/01/20 0500  Protime-INR  Daily,   STAT (with R occurrences)      08/31/20 1210            DVT prophylaxis:  on chronic Coumadin   Code Status: Full Family Communication: niece at bedside on 4/28, niece over the phone on 4/29 and 4/30 Disposition:   Dispo: The patient is from: ALF              Anticipated d/c is to: SNF              Anticipated d/c date is: 48hrs , need urology clearance                 Consultants:   Urology, Dr. Lovena Neighbours  Critical care  Procedures:   In and out cath for urinary retention   Left JJ stent placement on 4/30   Antimicrobials:   Anti-infectives (From admission, onward)   Start     Dose/Rate Route Frequency Ordered Stop   09/04/20 1400  ciprofloxacin (CIPRO) tablet 500 mg        500 mg Oral 2 times daily 09/04/20 1325     09/04/20 1200  aztreonam (AZACTAM) 1 g in sodium chloride 0.9 % 100 mL IVPB  Status:  Discontinued        1 g 200 mL/hr over 30 Minutes Intravenous Every 8 hours 09/04/20 0819 09/04/20 1325   09/03/20 0930  ciprofloxacin (CIPRO) IVPB 200 mg  Status:  Discontinued       Note to Pharmacy: Give pre-op hour   200 mg 100 mL/hr over 60 Minutes Intravenous Every 12 hours 09/03/20 0830 09/04/20 1023   09/02/20 2000  aztreonam (AZACTAM) 0.5 g in  dextrose 5 % 50 mL IVPB  Status:  Discontinued        0.5 g 100 mL/hr over 30 Minutes Intravenous Every 8 hours 09/02/20 1843 09/04/20 0819          Objective: Vitals:   09/04/20 1100 09/04/20 1200 09/04/20 1304 09/04/20 1547  BP: (!) 117/93 131/84 (!) 110/91 135/85  Pulse: 74 81 76 83  Resp: 15 (!) 22 16 18   Temp:   97.8 F (36.6 C) 98.2 F (36.8 C)  TempSrc:   Oral  SpO2: 96% 94% 97% 95%  Weight:      Height:        Intake/Output Summary (Last 24 hours) at 09/04/2020 1905 Last data filed at 09/04/2020 1826 Gross per 24 hour  Intake 1093.29 ml  Output 1275 ml  Net -181.71 ml   Filed Weights   08/31/20 0655 09/03/20 1245  Weight: 82.9 kg 82.9 kg    Examination:  General exam: frail,   weak, no resting tremor today, aaox3 Respiratory system: Clear to auscultation. No wheezing, no rales, no rhonchi  Cardiovascular system: S1 & S2 heard, RRR. No JVD, no murmur, No pedal edema. Gastrointestinal system: Abdomen is nondistended, soft and nontender. Normal bowel sounds heard. Central nervous system: Alert and oriented. No focal neurological deficits. Extremities: generalized weakness, nonfocal, trace edema? Patient think she is more swollen today Skin: No rashes, lesions or ulcers Psychiatry: Judgement and insight appear normal. Mood & affect appropriate.     Data Reviewed: I have personally reviewed following labs and imaging studies  CBC: Recent Labs  Lab 08/31/20 0703 09/01/20 0519 09/02/20 0527 09/03/20 0835 09/04/20 0436  WBC 16.7* 15.8* 19.7* 19.8* 21.7*  NEUTROABS 13.6*  --  16.2* 17.2* 19.0*  HGB 12.6 12.4 10.7* 10.8* 9.8*  HCT 37.4 36.8 33.1* 31.6* 29.3*  MCV 87.6 88.9 89.5 86.8 87.7  PLT 264 239 227 219 852    Basic Metabolic Panel: Recent Labs  Lab 08/31/20 0703 09/01/20 0519 09/02/20 0527 09/03/20 0835 09/04/20 0436  NA 129* 131* 131* 128* 131*  K 3.9 4.0 4.0 4.1 4.6  CL 91* 95* 97* 99 102  CO2 24 24 25  21* 22  GLUCOSE 107* 86 101*  104* 131*  BUN 34* 35* 31* 28* 33*  CREATININE 2.39* 2.22* 1.99* 2.02* 1.60*  CALCIUM 9.0 8.5* 8.6* 8.0* 8.5*  MG  --   --  2.3  --  2.3  PHOS  --   --   --   --  3.7    GFR: Estimated Creatinine Clearance: 33.2 mL/min (A) (by C-G formula based on SCr of 1.6 mg/dL (H)).  Liver Function Tests: No results for input(s): AST, ALT, ALKPHOS, BILITOT, PROT, ALBUMIN in the last 168 hours.  CBG: No results for input(s): GLUCAP in the last 168 hours.   Recent Results (from the past 240 hour(s))  Resp Panel by RT-PCR (Flu A&B, Covid) Nasopharyngeal Swab     Status: None   Collection Time: 08/31/20 11:27 AM   Specimen: Nasopharyngeal Swab; Nasopharyngeal(NP) swabs in vial transport medium  Result Value Ref Range Status   SARS Coronavirus 2 by RT PCR NEGATIVE NEGATIVE Final    Comment: (NOTE) SARS-CoV-2 target nucleic acids are NOT DETECTED.  The SARS-CoV-2 RNA is generally detectable in upper respiratory specimens during the acute phase of infection. The lowest concentration of SARS-CoV-2 viral copies this assay can detect is 138 copies/mL. A negative result does not preclude SARS-Cov-2 infection and should not be used as the sole basis for treatment or other patient management decisions. A negative result may occur with  improper specimen collection/handling, submission of specimen other than nasopharyngeal swab, presence of viral mutation(s) within the areas targeted by this assay, and inadequate number of viral copies(<138 copies/mL). A negative result must be combined with clinical observations, patient history, and epidemiological information. The expected result is Negative.  Fact Sheet for Patients:  EntrepreneurPulse.com.au  Fact Sheet for Healthcare Providers:  IncredibleEmployment.be  This test is no t yet approved or cleared by the [place]  FDA and  has been authorized for detection and/or diagnosis of SARS-CoV-2 by FDA under an  Emergency Use Authorization (EUA). This EUA will remain  in effect (meaning this test can be used) for the duration of the COVID-19 declaration under Section 564(b)(1) of the Act, 21 U.S.C.section 360bbb-3(b)(1), unless the authorization is terminated  or revoked sooner.       Influenza A by PCR NEGATIVE NEGATIVE Final   Influenza B by PCR NEGATIVE NEGATIVE Final    Comment: (NOTE) The Xpert Xpress SARS-CoV-2/FLU/RSV plus assay is intended as an aid in the diagnosis of influenza from Nasopharyngeal swab specimens and should not be used as a sole basis for treatment. Nasal washings and aspirates are unacceptable for Xpert Xpress SARS-CoV-2/FLU/RSV testing.  Fact Sheet for Patients: EntrepreneurPulse.com.au  Fact Sheet for Healthcare Providers: IncredibleEmployment.be  This test is not yet approved or cleared by the [place] FDA and has been authorized for detection and/or diagnosis of SARS-CoV-2 by FDA under an Emergency Use Authorization (EUA). This EUA will remain in effect (meaning this test can be used) for the duration of the COVID-19 declaration under Section 564(b)(1) of the Act, 21 U.S.C. section 360bbb-3(b)(1), unless the authorization is terminated or revoked.  Performed at [facility], [address] [place], [place] [address]   MRSA PCR Screening     Status: None   Collection Time: 08/31/20  1:25 PM   Specimen: Nasopharyngeal  Result Value Ref Range Status   MRSA by PCR NEGATIVE NEGATIVE Final    Comment:        The GeneXpert MRSA Assay (FDA approved for NASAL specimens only), is one component of a comprehensive MRSA colonization surveillance program. It is not intended to diagnose MRSA infection nor to guide or monitor treatment for MRSA infections. Performed at [facility], [address] [place], [place] [address]   Urine Culture     Status: Abnormal   Collection Time: 09/01/20  3:15 PM    Specimen: Urine, Random  Result Value Ref Range Status   Specimen Description   Final    URINE, RANDOM Performed at [facility], [address] [place], [place] [address]    Special Requests   Final    NONE Performed at [facility], [place], [place] [address]    Culture >=100,000 COLONIES/mL PROTEUS MIRABILIS (A)  Final   Report Status 09/04/2020 FINAL  Final   Organism ID, Bacteria PROTEUS MIRABILIS (A)  Final      Susceptibility   Proteus mirabilis - MIC*    AMPICILLIN <=2 SENSITIVE Sensitive     CEFAZOLIN <=4 SENSITIVE Sensitive     CEFEPIME <=0.12 SENSITIVE Sensitive     CEFTRIAXONE <=0.25 SENSITIVE Sensitive     CIPROFLOXACIN <=0.25 SENSITIVE Sensitive     GENTAMICIN <=1 SENSITIVE Sensitive     IMIPENEM 2 SENSITIVE Sensitive     NITROFURANTOIN 128 RESISTANT Resistant     TRIMETH/SULFA <=20 SENSITIVE Sensitive     AMPICILLIN/SULBACTAM <=2 SENSITIVE Sensitive     PIP/TAZO <=4 SENSITIVE Sensitive     * >=100,000 COLONIES/mL PROTEUS MIRABILIS  CULTURE, BLOOD (ROUTINE X 2) w Reflex to ID Panel     Status: None (Preliminary result)   Collection Time: 09/02/20  9:10 AM   Specimen: BLOOD  Result Value Ref Range Status   Specimen Description BLOOD BLOOD LEFT ARM  Final   Special Requests   Final    BOTTLES DRAWN AEROBIC AND ANAEROBIC Blood Culture adequate volume  Culture   Final    NO GROWTH 2 DAYS Performed at [facility], [address] [place], [place] [address]    Report Status PENDING  Incomplete  CULTURE, BLOOD (ROUTINE X 2) w Reflex to ID Panel     Status: None (Preliminary result)   Collection Time: 09/02/20  9:10 AM   Specimen: BLOOD  Result Value Ref Range Status   Specimen Description BLOOD BLOOD LEFT FOREARM  Final   Special Requests   Final    BOTTLES DRAWN AEROBIC AND ANAEROBIC Blood Culture adequate volume   Culture   Final    NO GROWTH 2 DAYS Performed at [facility], [address],  [place], [place] [address]    Report Status PENDING  Incomplete  Urine Culture     Status: Abnormal (Preliminary result)   Collection Time: 09/03/20 10:28 AM   Specimen: PATH Cytology Urine  Result Value Ref Range Status   Specimen Description   Final    URINE, RANDOM Performed at [facility], [address], [place], [place] [address]    Special Requests   Final    NONE Performed at [facility], [address], [place], [place] [address]    Culture (A)  Final    60,000 COLONIES/mL PROTEUS MIRABILIS SUSCEPTIBILITIES TO FOLLOW Performed at [facility], [place] [address], [place], [place] [address]    Report Status PENDING  Incomplete         Radiology Studies: DG Chest Port 1 View  Result Date: 09/04/2020 CLINICAL DATA:  Shortness of breath EXAM: PORTABLE CHEST 1 VIEW COMPARISON:  CT 08/31/2020, radiograph 06/10/2014 FINDINGS: There are some chronically coarsened interstitial changes in both lungs. Some increasing pulmonary vascular congestion with fissural and septal thickening and small left pleural effusion could reflect developing pulmonary edema. Focal consolidative opacity. Stable cardiomediastinal contours with a calcified, tortuous aorta. No acute osseous or soft tissue abnormality. Degenerative changes are present in the imaged spine and shoulders. Telemetry leads overlie the chest. IMPRESSION: Increasing pulmonary vascular congestion with interstitial changes suggestive of developing pulmonary edema on a background of chronic interstitial change and atelectasis. Small left effusion as well. Aortic Atherosclerosis (ICD10-I70.0). Electronically Signed   By: Lovena Le M.D.   On: 09/04/2020 06:29   DG OR UROLOGY CYSTO IMAGE ([facility] ONLY)  Result Date: 09/03/2020 There is no interpretation for this exam.  This order is for images obtained during a surgical procedure.  Please See "Surgeries" Tab for more information regarding the procedure.         Scheduled Meds: . acetaminophen  1,000 mg Oral Q8H  . buPROPion  300 mg Oral Daily  . Chlorhexidine Gluconate Cloth  6 each Topical Daily  . ciprofloxacin  500 mg Oral BID  . famotidine  10 mg Oral QHS  . fluticasone  2 spray Each Nare Daily  . furosemide  20 mg Intravenous Once  . guaiFENesin  600 mg Oral BID  . loratadine  10 mg Oral Daily  . mouth rinse  15 mL Mouth Rinse BID  . metoprolol tartrate  12.5 mg Oral BID  . mometasone-formoterol  2 puff Inhalation BID  . multivitamin with minerals  1 tablet Oral Daily  . polyethylene glycol  17 g Oral Daily  . senna-docusate  1 tablet Oral BID  . simvastatin  10 mg Oral QHS  . tamsulosin  0.4 mg Oral Daily  . traMADol  50 mg Oral Daily  . vitamin B-12  250 mcg Oral Daily  . Warfarin - Pharmacist Dosing Inpatient   Does not apply q1600  . zolpidem  2.5 mg Oral QHS   Continuous Infusions:    LOS: 1 day   Time spent: 52mins Greater than 50% of this time was spent in counseling, explanation of diagnosis, planning of further management, and coordination of care.   Voice Recognition Viviann Spare dictation system was used to create this note, attempts have been made to correct errors. Please contact the author with questions and/or clarifications.   Florencia Reasons, MD PhD FACP [facility]  Available via Epic secure chat 7am-7pm for nonurgent issues Please page for urgent issues To page the attending provider between 7A-7P or the covering provider during after hours 7P-7A, please log into the web site [email or web address] and access using universal [place] password for that web site. If you do not have the password, please call the hospital operator.    09/04/2020, 7:05 PM

## 2020-09-04 NOTE — Progress Notes (Signed)
NAME:  Jessica Long, MRN:  [id number], DOB:  09/25/1945, LOS: 1 ADMISSION DATE:  08/31/2020, CONSULTATION DATE: 09/03/2020 REFERRING MD: Florencia Reasons MD , CHIEF COMPLAINT: Shortness of breath  History of present illness   75 year old female with significant PMH as below presenting to the ED on 08/31/2020 from [place] assisted living with chief complaints of shortness of breath x1 week. Per ED reports, EMS reports facility obtained labs due to ongoing shortness of breath including a D-dimer which was elevated at 450 so patient was sent to the ED to rule out PE.  ED Course: On arrival to the ED, she was afebrile with blood pressure 113/59 mm Hg and pulse rate 98 beats/min.  Per ED reports, there were no focal neurological deficits; she was alert and oriented x4, and he did not complain of any abdominal pain, palpitation, nausea vomiting, fevers or chills.  Initial pertinent labs revealed Na 129, K 3.9, chloride 91, bicarb 24, glucose 107, BUN 32 creatinine 2.39, BNP 170, white count 16.7 hemoglobin 12.6 hematocrit 37.4, MCV 87.6 RDW 14.9 platelets 264.  Patient was unable to get CT angiogram due to worsening chronic kidney disease but was noted to have a supratherapeutic INR.  A CT scan of the chest without contrast was obtained and showed mild bilateral posterior basilar subsegmental atelectasis.  Moderate sized sliding-type hiatal hernia.  Mild coronary artery calcification.  She was admitted to hospitalist service for further management.  Hospital Course: During the course of her hospitalization, she was noted to have acute urinary retention requiring in and out catheterization and started on Flomax.  Last night she was noted to be hypoxic, hypotensive and tachycardic.  UA showed rare bacteria with small leukocyte.  Renal ultrasound was obtained and was positive for left-sided hydronephrosis with obstructing UPJ stone therefore she was started on antibiotics empirically.  Case was discussed with urology Dr.  Lovena Neighbours by hospitalist who recommended a CT renal stone study, and to keep the patient n.p.o. pending intervention.  Repeat CT stone study confirmed the presence of left-sided hydronephrosis due to an obstructing UPJ calculus.  Patient was taken to the OR today for cystoscopy with left JJ stent placement and left retrograde pyelogram with intraoperative interpretation fluoroscopic imaging.  PCCM consulted for urosepsis and possible requiring pressors upon return from the OR. Patient returned to the ICU intubated and mechanically ventilated now s/p extubation.   Past Medical History  Chronic atrial fibrillation on Coumadin Chronic diastolic CHF CVA Dyslipidemia CKD Osteoarthritis Hyperlipidemia  Significant Hospital Events   4/27: Admitted to hospitalist service 4/29: Became hypoxic, hypotensive and tachycardic with concerns of urosepsis.  Urology consulted for 18 mm left UPJ calculus 4/30: PCCM consulted 4/30: Status post cystoscopy with left JJ stent placement and left retrograde pyelogram with intraoperative interpretation fluoroscopic imaging.   Consults:  Allergy PCCM  Procedures:  4/30: Cystoscopy with left JJ stent placement 4/30: Intubation 4/30: Extubation @ 2:25pm  Significant Diagnostic Tests:  4/30: CT renal> high-grade left sided obstructive uropathy due to an 18 mm obstructing left UPJ calculus, nonobstructing 14 mm left renal calculus.  Bilateral renal cortical atrophy with prominent areas of cortical scarring in the right kidney. 4/29: US renal stone> moderate left hydronephrosis secondary to an obstructing 17 mm calculus with left ureteral pelvic junction.  Superimposed 19 mm terminal lower pole nonobstructing calculus. 4/27: CT Chest > mild bilateral posterior basilar subsegmental atelectasis, moderate size sliding-type hiatal hernia  Micro Data:  4/27: SARS-CoV-2 PCR>> negative 4/27: Influenza PCR>> negative 4/29: Blood  culture x2>>No growth thus far 4/30: Urine  Cx>>no growth 4/27: MRSA PCR>> negative  Antimicrobials:   Aztreonam 4/30>  OBJECTIVE  Blood pressure (!) 141/81, pulse 70, temperature 97.8 F (36.6 C), temperature source Oral, resp. rate (!) 23, height 5\' 8"  (1.727 m), weight 82.9 kg, SpO2 98 %.    Vent Mode: PSV FiO2 (%):  [30 %-40 %] 30 % Set Rate:  [16 bmp] 16 bmp Vt Set:  [450 mL] 450 mL PEEP:  [5 cmH20] 5 cmH20 Pressure Support:  [5 cmH20] 5 cmH20   Intake/Output Summary (Last 24 hours) at 09/04/2020 0948 Last data filed at 09/04/2020 0800 Gross per 24 hour  Intake 3519.57 ml  Output 1165 ml  Net 2354.57 ml   Filed Weights   08/31/20 0655 09/03/20 1245  Weight: 82.9 kg 82.9 kg    Physical Examination  GENERAL:year-old patient lying in the bed with no acute distress post extubation EYES: Pupils equal, round, reactive to light and accommodation. No scleral icterus. Extraocular muscles intact.  HEENT: Head atraumatic, normocephalic. Oropharynx and nasopharynx clear.  NECK:  Supple, no jugular venous distention. No thyroid enlargement, no tenderness.  LUNGS: Normal breath sounds bilaterally, no wheezing, rales,rhonchi or crepitation. No use of accessory muscles of respiration.  CARDIOVASCULAR: S1, S2 normal. No murmurs, rubs, or gallops.  ABDOMEN: Soft, tender, nondistended. Bowel sounds present. No organomegaly or mass.  EXTREMITIES: No pedal edema, cyanosis, or clubbing.  NEUROLOGIC: Cranial nerves II through XII are intact. Except speech is mildly garbled but comprehensible. Muscle strength 5/5 in all extremities. Sensation intact. Gait not checked.  PSYCHIATRIC: The patient is alert and oriented x 3.  SKIN: No obvious rash, lesion, or ulcer.   Labs/imaging that I havepersonally reviewed  (right click and "Reselect all SmartList Selections" daily)      Labs   CBC: Recent Labs  Lab 08/31/20 0703 09/01/20 0519 09/02/20 0527 09/03/20 0835 09/04/20 0436  WBC 16.7* 15.8* 19.7* 19.8* 21.7*  NEUTROABS 13.6*  --   16.2* 17.2* 19.0*  HGB 12.6 12.4 10.7* 10.8* 9.8*  HCT 37.4 36.8 33.1* 31.6* 29.3*  MCV 87.6 88.9 89.5 86.8 87.7  PLT 264 239 227 219 211    Basic Metabolic Panel: Recent Labs  Lab 08/31/20 0703 09/01/20 0519 09/02/20 0527 09/03/20 0835 09/04/20 0436  NA 129* 131* 131* 128* 131*  K 3.9 4.0 4.0 4.1 4.6  CL 91* 95* 97* 99 102  CO2 24 24 25  21* 22  GLUCOSE 107* 86 101* 104* 131*  BUN 34* 35* 31* 28* 33*  CREATININE 2.39* 2.22* 1.99* 2.02* 1.60*  CALCIUM 9.0 8.5* 8.6* 8.0* 8.5*  MG  --   --  2.3  --  2.3  PHOS  --   --   --   --  3.7   GFR: Estimated Creatinine Clearance: 33.2 mL/min (A) (by C-G formula based on SCr of 1.6 mg/dL (H)). Recent Labs  Lab 09/01/20 0519 09/02/20 0527 09/03/20 0835 09/04/20 0436  WBC 15.8* 19.7* 19.8* 21.7*    Liver Function Tests: No results for input(s): AST, ALT, ALKPHOS, BILITOT, PROT, ALBUMIN in the last 168 hours. No results for input(s): LIPASE, AMYLASE in the last 168 hours. No results for input(s): AMMONIA in the last 168 hours.  ABG No results found for: PHART, PCO2ART, PO2ART, HCO3, TCO2, ACIDBASEDEF, O2SAT   Coagulation Profile: Recent Labs  Lab 08/31/20 0703 09/01/20 0519 09/02/20 0527 09/03/20 0501 09/04/20 0436  INR 3.9* 4.8* 3.5* 3.0* 2.3*    Cardiac Enzymes: No  results for input(s): CKTOTAL, CKMB, CKMBINDEX, TROPONINI in the last 168 hours.  HbA1C: Hgb A1c MFr Bld  Date/Time Value Ref Range Status  07/24/2009 03:28 AM  4.6 - 6.1 % Final   5.8 (NOTE) The ADA recommends the following therapeutic goal for glycemic control related to Hgb A1c measurement: Goal of therapy: <6.5 Hgb A1c  Reference: American Diabetes Association: Clinical Practice Recommendations 2010, Diabetes Care, 2010, 33: (Suppl  1).    CBG: No results for input(s): GLUCAP in the last 168 hours.  Review of Systems:   Unable to obtain as patient is currently intubated and mechanically ventilated.  Past Medical History  She,  has a past  medical history of Allergic rhinitis, Atrial fibrillation ([place]), CHF (congestive heart failure) ([place]), Depression, Hyperlipemia, Osteoarthritis, and Renal disorder.   Surgical History    Past Surgical History:  Procedure Laterality Date  . CYSTOSCOPY WITH URETEROSCOPY AND STENT PLACEMENT Left 09/03/2020   Procedure: CYSTOSCOPY  AND LEFT STENT PLACEMENT;  Surgeon: Ceasar Mons, MD;  Location: [facility] ORS;  Service: Urology;  Laterality: Left;  Marland Kitchen MASTECTOMY     right side     Social History   reports that she has quit smoking. She has quit using smokeless tobacco. She reports that she does not drink alcohol and does not use drugs.   Family History   Her family history is not on file.   Allergies Allergies  Allergen Reactions  . Amoxicillin   . Ampicillin      Home Medications  Prior to Admission medications   Medication Sig Start Date End Date Taking? Authorizing Provider  acetaminophen (TYLENOL) 500 MG tablet Take 1,000 mg by mouth every 8 (eight) hours.   Yes [provider]  buPROPion (WELLBUTRIN XL) 300 MG 24 hr tablet Take 300 mg by mouth daily.   Yes [provider]  cetirizine (ZYRTEC) 10 MG tablet Take 10 mg by mouth daily.   Yes [provider]  metoprolol tartrate (LOPRESSOR) 25 MG tablet Take 12.5 mg by mouth in the morning.   Yes [provider]  Multiple Vitamin (MULTIVITAMIN) tablet Take 1 tablet by mouth daily.   Yes [provider]  potassium chloride SA (KLOR-CON) 20 MEQ tablet Take 20 mEq by mouth daily.   Yes [provider]  selenium sulfide (SELSUN) 1 % LOTN Apply 1 application topically daily.   Yes [provider]  simvastatin (ZOCOR) 10 MG tablet Take 10 mg by mouth at bedtime.   Yes [provider]  sodium chloride (OCEAN) 0.65 % SOLN nasal spray Place 2 sprays into both nostrils as needed.   Yes [provider]  torsemide (DEMADEX) 20 MG tablet Take 60 mg by mouth  daily.   Yes [provider]  traMADol (ULTRAM) 50 MG tablet Take 50 mg by mouth daily.   Yes [provider]  vitamin B-12 (CYANOCOBALAMIN) 250 MCG tablet Take 250 mcg by mouth daily.   Yes [provider]  warfarin (COUMADIN) 2 MG tablet Take 2 mg by mouth daily.   Yes [provider]  zolpidem (AMBIEN) 5 MG tablet Take 2.5 mg by mouth at bedtime.   Yes [provider]    Scheduled Meds: . acetaminophen  1,000 mg Oral Q8H  . AeroChamber Plus Flo-Vu Medium  1 each Other Once  . buPROPion  300 mg Oral Daily  . Chlorhexidine Gluconate Cloth  6 each Topical Daily  . fluticasone  2 spray Each Nare Daily  .  guaiFENesin  600 mg Oral BID  . loratadine  10 mg Oral Daily  . mouth rinse  15 mL Mouth Rinse BID  . metoprolol tartrate  12.5 mg Oral q AM  . mometasone-formoterol  2 puff Inhalation BID  . multivitamin with minerals  1 tablet Oral Daily  . polyethylene glycol  17 g Oral Daily  . senna-docusate  1 tablet Oral BID  . simvastatin  10 mg Oral QHS  . tamsulosin  0.4 mg Oral Daily  . traMADol  50 mg Oral Daily  . vitamin B-12  250 mcg Oral Daily  . zolpidem  2.5 mg Oral QHS   Continuous Infusions: . sodium chloride Stopped (09/03/20 1813)  . aztreonam    . ciprofloxacin 200 mg (09/04/20 0835)  . lactated ringers 50 mL/hr at 09/04/20 0800   PRN Meds:.hydrOXYzine, ipratropium-albuterol, morphine injection, ondansetron **OR** ondansetron (ZOFRAN) IV, sodium chloride   Active Hospital Problem list   Urosepsis AKI Hyponatremia Chronic diastolic CHF Chronic atrial fibrillation  Assessment & Plan:   Septic shock       Present on admission - due to proteus UTI and complicated by  Left UPJ Calculus -S/p cystoscopy with left JJ stent - -sensitivity panel with resistance - now on azactam and cipro IV -patient s/p extubation 09/03/20 - optimizing for transfer to Medical floor -PT/OT -Urology and [facility] on case - appreciate collaboration      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 - postrenal due to Obstructing Left UPJ Calculus - Monitor I&O's / urinary output - Follow BMP - Ensure adequate renal perfusion - Avoid nephrotoxic agents as able - Replace electrolytes as indicated     Hypovolemic Hyponatremia  Na 128 - Check cortisol, TSH,  - Follow serum osmolality, sodium osmolality, serial sodium, urine sodium - po -salt tab   Chronic Atrial Fibrillation - Rate controlled - Resume coumadin once INR stable - Follow INR   Chronic HFpEF (last known EF 60 to 65%) - Continuous cardiac monitoring - Maintain MAP greater than 65 - Hold Diuretics in the setting of above - Hold BP meds    Best practice:  Diet:  Oral Pain/Anxiety/Delirium protocol (if indicated): Yes (RASS goal 0) VAP protocol (if indicated): Not indicated DVT prophylaxis: Systemic AC GI prophylaxis: PPI Glucose control:  SSI No Central venous access:  N/A Arterial line:  N/A Foley:  N/A Mobility:  bed rest  PT consulted: N/A Last date of multidisciplinary goals of care discussion [4/30] Code Status:  full code Disposition: ICU  Critical care provider statement:    Critical care time (minutes):  33   Critical care time was exclusive of:  Separately billable procedures and  treating other patients   Critical care was necessary to treat or prevent imminent or  life-threatening deterioration of the following conditions:  AKI, sepsis with UTI, multiple comorbid coniditions   Critical care was time spent personally by me on the following  activities:  Development of treatment plan with patient or surrogate,  discussions with consultants, evaluation of patient's response to  treatment, examination of patient, obtaining history from patient or  surrogate, ordering and performing treatments and interventions, ordering  and review of laboratory studies and re-evaluation of patient's condition   I assumed direction of critical care for this patient from another   provider in my specialty: no            Ottie Glazier, M.D.  [facility] [place]    .

## 2020-09-04 NOTE — Progress Notes (Addendum)
[place] for Warfarin Indication: atrial fibrillation  Patient Measurements: Height: 5\' 8"  (172.7 cm) Weight: 82.9 kg (182 lb 12.2 oz) IBW/kg (Calculated) : 63.9  Labs: Recent Labs    09/02/20 0527 09/03/20 0501 09/03/20 0835 09/04/20 0436  HGB 10.7*  --  10.8* 9.8*  HCT 33.1*  --  31.6* 29.3*  PLT 227  --  219 229  LABPROT 35.3* 31.4*  --  25.5*  INR 3.5* 3.0*  --  2.3*  CREATININE 1.99*  --  2.02* 1.60*    Estimated Creatinine Clearance: 33.2 mL/min (A) (by C-G formula based on SCr of 1.6 mg/dL (H)).   Medical History: Past Medical History:  Diagnosis Date  . Allergic rhinitis   . Atrial fibrillation ([place])   . CHF (congestive heart failure) ([place])   . Depression   . Hyperlipemia   . Osteoarthritis   . Renal disorder     Assessment: 75 y.o.femalewith medical history significant forCVA, history of A. fib on chronic anticoagulation with Coumadin, chronic diastolic dysfunction CHF, dyslipidemia and chronic kidney disease who was sent to the ER for evaluation of shortness of breath. Warfarin was held on admission due to being admitted with a supratherapeutic INR. Her home regimen of warfarin is 2 mg daily. Last patient reported dose was on 4/26 prior to admission.   4/27 INR 3.9 Dose held 4/28 INR 4.8 Dose held 4/29 INR 3.5 Dose held 4/30 INR 3.0 Dose held 5/01 INR 2.3 Warfarin 2 mg  DDI: APAP, ciprofloxacin  Unclear exactly why patient was supratherapeutic on admission. Possibly secondary to poor PO intake. LFTs have not been checked.   Goal of Therapy:  INR 2 - 3 Monitor platelets by anticoagulation protocol: Yes   Plan:   INR therapeutic. Patient has missed 4 days of therapy. Will give warfarin 2 mg x 1 tonight (home dose)  Monitor closely as expect patient is warfarin sensitive given supratherapeutic INR on admission and now on ciprofloxacin. Though may take a couple of days for this to reflect given missed doses.  INR should be monitored closely upon discharge while on antibiotics  Daily INR to guide treatment plan  CBC at least every 3 days per protocol  Benita Gutter 09/04/2020,11:15 AM

## 2020-09-04 NOTE — Anesthesia Postprocedure Evaluation (Signed)
Anesthesia Post Note  Patient: Jessica Long  Procedure(s) Performed: CYSTOSCOPY  AND LEFT STENT PLACEMENT (Left )  Patient location during evaluation: SICU Anesthesia Type: General Level of consciousness: sedated Pain management: pain level controlled Vital Signs Assessment: post-procedure vital signs reviewed and stable Respiratory status: spontaneous breathing Cardiovascular status: stable Postop Assessment: no apparent nausea or vomiting Anesthetic complications: no   No complications documented.   Last Vitals:  Vitals:   09/04/20 0700 09/04/20 0800  BP: (!) 114/95 (!) 141/81  Pulse: 68 70  Resp: 12 (!) 23  Temp:    SpO2: 96% 98%    Last Pain:  Vitals:   09/04/20 0800  TempSrc:   PainSc: 0-No pain                 Milad Bublitz K

## 2020-09-04 NOTE — Progress Notes (Signed)
Attempted to call report x 2, nurse unavailable for report.

## 2020-09-05 DIAGNOSIS — B964 Proteus (mirabilis) (morganii) as the cause of diseases classified elsewhere: Secondary | ICD-10-CM

## 2020-09-05 DIAGNOSIS — R0602 Shortness of breath: Secondary | ICD-10-CM | POA: Diagnosis not present

## 2020-09-05 DIAGNOSIS — N390 Urinary tract infection, site not specified: Secondary | ICD-10-CM

## 2020-09-05 LAB — CBC WITH DIFFERENTIAL/PLATELET
Abs Immature Granulocytes: 1.01 10*3/uL — ABNORMAL HIGH (ref 0.00–0.07)
Basophils Absolute: 0.1 10*3/uL (ref 0.0–0.1)
Basophils Relative: 0 %
Eosinophils Absolute: 0 10*3/uL (ref 0.0–0.5)
Eosinophils Relative: 0 %
HCT: 30.1 % — ABNORMAL LOW (ref 36.0–46.0)
Hemoglobin: 10.2 g/dL — ABNORMAL LOW (ref 12.0–15.0)
Immature Granulocytes: 5 %
Lymphocytes Relative: 9 %
Lymphs Abs: 1.8 10*3/uL (ref 0.7–4.0)
MCH: 29.4 pg (ref 26.0–34.0)
MCHC: 33.9 g/dL (ref 30.0–36.0)
MCV: 86.7 fL (ref 80.0–100.0)
Monocytes Absolute: 1.1 10*3/uL — ABNORMAL HIGH (ref 0.1–1.0)
Monocytes Relative: 6 %
Neutro Abs: 15.6 10*3/uL — ABNORMAL HIGH (ref 1.7–7.7)
Neutrophils Relative %: 80 %
Platelets: 287 10*3/uL (ref 150–400)
RBC: 3.47 MIL/uL — ABNORMAL LOW (ref 3.87–5.11)
RDW: 15.3 % (ref 11.5–15.5)
Smear Review: NORMAL
WBC: 19.6 10*3/uL — ABNORMAL HIGH (ref 4.0–10.5)
nRBC: 0 % (ref 0.0–0.2)

## 2020-09-05 LAB — BASIC METABOLIC PANEL
Anion gap: 6 (ref 5–15)
BUN: 38 mg/dL — ABNORMAL HIGH (ref 8–23)
CO2: 23 mmol/L (ref 22–32)
Calcium: 8.5 mg/dL — ABNORMAL LOW (ref 8.9–10.3)
Chloride: 103 mmol/L (ref 98–111)
Creatinine, Ser: 1.49 mg/dL — ABNORMAL HIGH (ref 0.44–1.00)
GFR, Estimated: 36 mL/min — ABNORMAL LOW (ref >60)
Glucose, Bld: 103 mg/dL — ABNORMAL HIGH (ref 70–99)
Potassium: 4.1 mmol/L (ref 3.5–5.1)
Sodium: 132 mmol/L — ABNORMAL LOW (ref 135–145)

## 2020-09-05 LAB — URINE CULTURE: Culture: 60000 — AB

## 2020-09-05 LAB — PROTIME-INR
INR: 2 — ABNORMAL HIGH (ref 0.8–1.2)
Prothrombin Time: 23 seconds — ABNORMAL HIGH (ref 11.4–15.2)

## 2020-09-05 LAB — MAGNESIUM: Magnesium: 2 mg/dL (ref 1.7–2.4)

## 2020-09-05 MED ORDER — FUROSEMIDE 10 MG/ML IJ SOLN
40.0000 mg | Freq: Once | INTRAMUSCULAR | Status: AC
  Administered 2020-09-05: 40 mg via INTRAVENOUS
  Filled 2020-09-05: qty 4

## 2020-09-05 MED ORDER — WARFARIN SODIUM 1 MG PO TABS
1.0000 mg | ORAL_TABLET | Freq: Once | ORAL | Status: AC
  Administered 2020-09-05: 1 mg via ORAL
  Filled 2020-09-05: qty 1

## 2020-09-05 NOTE — Plan of Care (Signed)
  Problem: Activity: Goal: Risk for activity intolerance will decrease Outcome: Progressing   Problem: Elimination: Goal: Will not experience complications related to bowel motility Outcome: Progressing   Problem: Safety: Goal: Ability to remain free from injury will improve Outcome: Progressing   Problem: Skin Integrity: Goal: Risk for impaired skin integrity will decrease Outcome: Progressing   

## 2020-09-05 NOTE — TOC Progression Note (Signed)
Transition of Care [facility]) - Progression Note    Patient Details  Name: Jessica Long MRN: [id number] Date of Birth: Nov 10, 1945  [facility]) CM/SW Contact  Su Hilt, RN Phone Number: 09/05/2020, 3:14 PM  Clinical Narrative:     Niece June called back and gave the choice of [facility], Insurance approval request submitted  Expected Discharge Plan: [place] Barriers to Discharge: Continued Medical Work up  Expected Discharge Plan and Services Expected Discharge Plan: [place] arrangements for the past 2 months: [place]                                       Social Determinants of Health (SDOH) Interventions    Readmission Risk Interventions No flowsheet data found.

## 2020-09-05 NOTE — Evaluation (Signed)
Occupational Therapy Evaluation Patient Details Name: Jessica Long MRN: [id number] DOB: [birth year] Today's Date: 09/05/2020    History of Present Illness Pt is a 75 y/o F with PMH: CVA, history of A. fib on chronic anticoagulation with Coumadin, chronic diastolic dysfunction CHF, DLD, CKD, and h/o BRCA with R mastectomy. Pt presented from ALF d/t worsening SOB. W/u includes viral resp panel that is negative, CT chest without contrast showed "Mild bilateral posterior basilar subsegmental atelectasis. Moderate size sliding-type hiatal hernia" with orders to be seated up at 90 degrees with all oral intake. Pt also reported dysuria and UA with rare bacteria, with orders to obtain urine culture.   Clinical Impression   Pt seen for re-evaluation this date. Pt recently in ICU following surgery. Pt now on 2L O2, agreeable to therapy, denies pain at rest. SpO2 >93% throughout session, improving to 97% when EOB and with standing. Pt required CGA-MIN A for bed mobility and ADL transfers with RW. Pt declined to get to recliner 2/2 fatigue and stiffness/hip pain. With encouragement, pt took 5 lateral steps EOB with heavy BUE support on RW and CGA to improve positioning once returned to bed requiring MIN A for BLE mgt. Breakfast tray positioned over pt's lap. HOB at 60*, despite education in benefit of 57* for safety. Pt educated in role of OT, benefits of OOB, PLB during exertional activity, ADL transfer training, RW mgt, and falls prevention. Pt verbalized understanding and continues to benefit from skilled OT Services. OT goals reviewed and updated. Continue to recommend SNF for short term rehab to support ultimate safe return to ALF.     Follow Up Recommendations  SNF    Equipment Recommendations  None recommended by OT    Recommendations for Other Services       Precautions / Restrictions Precautions Precautions: Fall Precaution Comments: HOB at 90 degrees for oral intake, pt declines >60* despite  education Restrictions Weight Bearing Restrictions: No      Mobility Bed Mobility   Bed Mobility: Supine to Sit;Sit to Supine     Supine to sit: Min guard;HOB elevated Sit to supine: Min assist   General bed mobility comments: Min A for BLE mgt back to bed    Transfers Overall transfer level: Needs assistance Equipment used: Rolling walker (2 wheeled) Transfers: Sit to/from Stand Sit to Stand: Min assist;From elevated surface         General transfer comment: Cues for hand and foot placement, Min A for power up, able to take lateral steps with CGA to get higher EOB    Balance Overall balance assessment: Needs assistance Sitting-balance support: Feet supported;No upper extremity supported Sitting balance-Leahy Scale: Good     Standing balance support: Bilateral upper extremity supported Standing balance-Leahy Scale: Poor Standing balance comment: heavy reliance on BUE RW                           ADL either performed or assessed with clinical judgement   ADL Overall ADL's : Needs assistance/impaired Eating/Feeding: Set up   Grooming: Sitting;Set up;Supervision/safety                                 General ADL Comments: Pt requires set up and supervision for seated grooming and long sitting in bed for eating, MIN A for UB ADL, MOD A for LB ADL seated EOB 2/2 general weakness, decreased dynamic sitting  balance, and decreased LB ROM and arthritic pain     Vision Patient Visual Report: No change from baseline       Perception     Praxis      Pertinent Vitals/Pain Pain Assessment: Faces Faces Pain Scale: Hurts little more Pain Location: arthritic hip pain & "sore all over" Pain Descriptors / Indicators: Aching;Sore Pain Intervention(s): Limited activity within patient's tolerance;Monitored during session;Repositioned;Utilized relaxation techniques     Hand Dominance Right (reports R hand has been weaker since Mastectomy, but still  uses R hand for eating/writing)   Extremity/Trunk Assessment Upper Extremity Assessment Upper Extremity Assessment: Generalized weakness   Lower Extremity Assessment Lower Extremity Assessment: Generalized weakness       Communication Communication Communication: No difficulties   Cognition Arousal/Alertness: Awake/alert Behavior During Therapy: Anxious;WFL for tasks assessed/performed Overall Cognitive Status: Within Functional Limits for tasks assessed                                 General Comments: alert and oriented, cues to redirect as pt is somewhat anxious, follows commands with cues, encouragement for mobility   General Comments  On 2L O2, SpO2 >93% throughout, improving with upright/EOB and standing, pt endorsed very mild lightheadedness upon initial sitting EOB but resolved quickly    Exercises Other Exercises Other Exercises: Pt educated in role of OT, benefits of OOB, PLB during exertional activity, ADL transfer training, RW mgt, and falls prevention   Shoulder Instructions      Home Living Family/patient expects to be discharged to:: Assisted living                             Home Equipment: Walker - 2 wheels;Shower seat          Prior Functioning/Environment Level of Independence: Needs assistance  Gait / Transfers Assistance Needed: pt able to perform fxl mobility with RW, prefers to take her meals in her room, but does walk around facility some. ADL's / Homemaking Assistance Needed: Pt reports she is able to perform her bathing and dressing with MOD I (much of these tasks done in sitting). Facility assists with laundry and making bed as well as tranport, meals and med mgt.            OT Problem List: Decreased strength;Impaired balance (sitting and/or standing);Decreased range of motion;Decreased activity tolerance;Cardiopulmonary status limiting activity;Pain      OT Treatment/Interventions: Self-care/ADL training;DME  and/or AE instruction;Therapeutic activities;Balance training;Therapeutic exercise;Energy conservation;Patient/family education    OT Goals(Current goals can be found in the care plan section) Acute Rehab OT Goals Patient Stated Goal: to go back to my ALF OT Goal Formulation: With patient Time For Goal Achievement: 09/19/20 Potential to Achieve Goals: Good ADL Goals Pt Will Perform Lower Body Dressing: with min guard assist;sit to/from stand (with LRAD/AE PRN) Pt Will Transfer to Toilet: ambulating;grab bars;with min guard assist (with LRAD/BSC over toilet PRN)  OT Frequency: Min 1X/week   Barriers to D/C:            Co-evaluation              AM-PAC OT "6 Clicks" Daily Activity     Outcome Measure Help from another person eating meals?: A Little Help from another person taking care of personal grooming?: A Little Help from another person toileting, which includes using toliet, bedpan, or urinal?: A Little Help from  another person bathing (including washing, rinsing, drying)?: A Lot Help from another person to put on and taking off regular upper body clothing?: A Little Help from another person to put on and taking off regular lower body clothing?: A Lot 6 Click Score: 16   End of Session Equipment Utilized During Treatment: Rolling walker;Oxygen  Activity Tolerance: Patient tolerated treatment well Patient left: in bed;with bed alarm set;with call bell/phone within reach  OT Visit Diagnosis: Unsteadiness on feet (R26.81);Muscle weakness (generalized) (M62.81)                Time: 2836-6294 OT Time Calculation (min): 20 min Charges:  OT General Charges $OT Visit: 1 Visit OT Evaluation $OT Re-eval: 1 Re-eval OT Treatments $Self Care/Home Management : 8-22 mins  Hanley Hays, MPH, MS, OTR/L ascom [phone] 09/05/20, 9:20 AM

## 2020-09-05 NOTE — TOC Progression Note (Signed)
Transition of Care [facility]) - Progression Note    Patient Details  Name: Jessica Long MRN: [id number] Date of Birth: [birth year]  Transition of Care [facility]) CM/SW [place], RN Phone Number: 09/05/2020, 2:53 PM  Clinical Narrative:   Spoke with the patient at the bedside and reviewed the bed options, the patient requested that I call her Niece Jessica Long, she stated that whatever she chooses will be fine, I called her Niece Jessica Long and reviewed the bed choices, she will call back in a few min with which one    Expected Discharge Plan: [place] Barriers to Discharge: Continued Medical Work up  Expected Discharge Plan and Services Expected Discharge Plan: [place] arrangements for the past 2 months: [place]                                       Social Determinants of Health (SDOH) Interventions    Readmission Risk Interventions No flowsheet data found.

## 2020-09-05 NOTE — Progress Notes (Signed)
Physical Therapy Treatment Patient Details Name: Jessica Long MRN: [id number] DOB: [birth year] Today's Date: 09/05/2020    History of Present Illness Pt is a 75 y/o F with PMH: CVA, history of A. fib on chronic anticoagulation with Coumadin, chronic diastolic dysfunction CHF, DLD, CKD, and h/o BRCA with R mastectomy. Pt presented from ALF d/t worsening SOB. W/u includes viral resp panel that is negative, CT chest without contrast showed "Mild bilateral posterior basilar subsegmental atelectasis. Moderate size sliding-type hiatal hernia" with orders to be seated up at 90 degrees with all oral intake. Pt also reported dysuria and UA with rare bacteria, with orders to obtain urine culture.  Now s/p cystoscopy with L JJ stent placement (09/03/20)    PT Comments    Patient endorses feeling generally weak this date, but agreeable to participation with session as tolerated.  Currently requiring min/mod assist for lift off from edge of bed, ambulatory transfer bed/chair with RW. Mod reliance on RW for external stabilization; hands-on assist at all times due to global weakness and generalized unsteadiness.  Declined additional gait trial due to fatigue, but patient pleased with ability for OOB to chair. Trialed on RA for O2 qualification; maintained sats >90% on RA at rest and with activity.  Left on RA end of session.  RN informed/aware and to continue monitoring.    Follow Up Recommendations  SNF     Equipment Recommendations       Recommendations for Other Services       Precautions / Restrictions Precautions Precautions: Fall Restrictions Weight Bearing Restrictions: No    Mobility  Bed Mobility Overal bed mobility: Needs Assistance Bed Mobility: Supine to Sit     Supine to sit: Min assist     General bed mobility comments: assist for truncal elevation    Transfers Overall transfer level: Needs assistance Equipment used: Rolling walker (2 wheeled) Transfers: Sit to/from  Stand Sit to Stand: Min assist;Mod assist         General transfer comment: min/mod assist for lift off from standard seating surface; requires UE support to complete.  Globally weak and deconditioned  Ambulation/Gait Ambulation/Gait assistance: Min Web designer (Feet): 5 Feet Assistive device: Rolling walker (2 wheeled)       General Gait Details: 3-point, step to gait pattern; mod reliance on RW, requiring UE support for optimal balance, safety throughout.  Fatigues quickly; unable to tolerate additional distance as result   Stairs             Wheelchair Mobility    Modified Rankin (Stroke Patients Only)       Balance Overall balance assessment: Needs assistance Sitting-balance support: Feet supported;No upper extremity supported Sitting balance-Leahy Scale: Good     Standing balance support: Bilateral upper extremity supported Standing balance-Leahy Scale: Fair                              Cognition Arousal/Alertness: Awake/alert Behavior During Therapy: WFL for tasks assessed/performed Overall Cognitive Status: Within Functional Limits for tasks assessed                                        Exercises Other Exercises Other Exercises: Seated LE therex, 1x10, active ROM for muscular strength/endurance with functional activities.  Endorses mild R hip pain with active strengthening, improved with smaller amplitude movement  General Comments        Pertinent Vitals/Pain Pain Assessment: Faces Faces Pain Scale: Hurts a little bit Pain Location: arthritic hip pain & "sore all over" Pain Descriptors / Indicators: Aching;Sore;Grimacing Pain Intervention(s): Monitored during session;Limited activity within patient's tolerance;Repositioned    Home Living                      Prior Function            PT Goals (current goals can now be found in the care plan section) Acute Rehab PT Goals Patient Stated  Goal: to go back to my ALF PT Goal Formulation: With patient Time For Goal Achievement: 09/16/20 Potential to Achieve Goals: Good Progress towards PT goals: Progressing toward goals    Frequency    Min 2X/week      PT Plan Current plan remains appropriate    Co-evaluation              AM-PAC PT "6 Clicks" Mobility   Outcome Measure  Help needed turning from your back to your side while in a flat bed without using bedrails?: A Little Help needed moving from lying on your back to sitting on the side of a flat bed without using bedrails?: A Little Help needed moving to and from a bed to a chair (including a wheelchair)?: A Little Help needed standing up from a chair using your arms (e.g., wheelchair or bedside chair)?: A Little Help needed to walk in hospital room?: A Little Help needed climbing 3-5 steps with a railing? : A Lot 6 Click Score: 17    End of Session Equipment Utilized During Treatment: Gait belt Activity Tolerance: Patient tolerated treatment well Patient left: in chair;with call bell/phone within reach;with chair alarm set Nurse Communication: Mobility status PT Visit Diagnosis: Unsteadiness on feet (R26.81);Muscle weakness (generalized) (M62.81);Difficulty in walking, not elsewhere classified (R26.2)     Time: 7357-8978 PT Time Calculation (min) (ACUTE ONLY): 31 min  Charges:  $Therapeutic Exercise: 8-22 mins $Therapeutic Activity: 8-22 mins                     Delona Clasby H. Owens Shark, PT, DPT, NCS 09/05/20, 4:57 PM [phone]

## 2020-09-05 NOTE — Progress Notes (Signed)
Urology Inpatient Progress Note  Subjective: Patient extubated and transferred to floor status overnight.  She is afebrile, VSS. WBC count down today, 19.6.  Creatinine down today, 1.49.  Blood cultures pending with no growth at 3 days.  Urine culture positive for nitrofurantoin resistant Proteus mirabilis.  On antibiotics as below. Foley catheter in place draining clear, yellow urine. She reports some left flank pain.  Anti-infectives: Anti-infectives (From admission, onward)   Start     Dose/Rate Route Frequency Ordered Stop   09/04/20 1400  ciprofloxacin (CIPRO) tablet 500 mg        500 mg Oral 2 times daily 09/04/20 1325     09/04/20 1200  aztreonam (AZACTAM) 1 g in sodium chloride 0.9 % 100 mL IVPB  Status:  Discontinued        1 g 200 mL/hr over 30 Minutes Intravenous Every 8 hours 09/04/20 0819 09/04/20 1325   09/03/20 0930  ciprofloxacin (CIPRO) IVPB 200 mg  Status:  Discontinued       Note to Pharmacy: Give pre-op hour   200 mg 100 mL/hr over 60 Minutes Intravenous Every 12 hours 09/03/20 0830 09/04/20 1023   09/02/20 2000  aztreonam (AZACTAM) 0.5 g in dextrose 5 % 50 mL IVPB  Status:  Discontinued        0.5 g 100 mL/hr over 30 Minutes Intravenous Every 8 hours 09/02/20 1843 09/04/20 0819      Current Facility-Administered Medications  Medication Dose Route Frequency Provider Last Rate Last Admin  . acetaminophen (TYLENOL) tablet 1,000 mg  1,000 mg Oral Q8H Agbata, Tochukwu, MD   1,000 mg at 09/05/20 0620  . buPROPion (WELLBUTRIN XL) 24 hr tablet 300 mg  300 mg Oral Daily Agbata, Tochukwu, MD   300 mg at 09/04/20 1048  . Chlorhexidine Gluconate Cloth 2 % PADS 6 each  6 each Topical Daily Jessica Glazier, MD   6 each at 09/04/20 0355  . ciprofloxacin (CIPRO) tablet 500 mg  500 mg Oral BID Jessica Reasons, MD   500 mg at 09/04/20 1351  . famotidine (PEPCID) tablet 10 mg  10 mg Oral QHS Jessica Reasons, MD   10 mg at 09/04/20 2126  . fluticasone (FLONASE) 50 MCG/ACT nasal spray 2 spray  2  spray Each Nare Daily Jessica Reasons, MD   2 spray at 09/04/20 1048  . guaiFENesin (MUCINEX) 12 hr tablet 600 mg  600 mg Oral BID Jessica Reasons, MD   600 mg at 09/04/20 2126  . hydrOXYzine (ATARAX/VISTARIL) tablet 10 mg  10 mg Oral TID PRN Jessica Reasons, MD   10 mg at 09/03/20 2102  . ipratropium-albuterol (DUONEB) 0.5-2.5 (3) MG/3ML nebulizer solution 3 mL  3 mL Nebulization Q6H PRN Agbata, Tochukwu, MD   3 mL at 09/01/20 1216  . loratadine (CLARITIN) tablet 10 mg  10 mg Oral Daily Agbata, Tochukwu, MD   10 mg at 09/04/20 1043  . MEDLINE mouth rinse  15 mL Mouth Rinse BID Jessica Glazier, MD   15 mL at 09/04/20 2131  . metoprolol tartrate (LOPRESSOR) tablet 12.5 mg  12.5 mg Oral BID Jessica Reasons, MD   12.5 mg at 09/04/20 2122  . mometasone-formoterol (DULERA) 200-5 MCG/ACT inhaler 2 puff  2 puff Inhalation BID Agbata, Tochukwu, MD   2 puff at 09/04/20 2131  . morphine 2 MG/ML injection 2 mg  2 mg Intravenous Q2H PRN Jessica Masse, MD   2 mg at 09/04/20 0353  . multivitamin with minerals tablet 1 tablet  1  tablet Oral Daily Agbata, Tochukwu, MD   1 tablet at 09/04/20 1043  . ondansetron (ZOFRAN) tablet 4 mg  4 mg Oral Q6H PRN Agbata, Tochukwu, MD       Or  . ondansetron (ZOFRAN) injection 4 mg  4 mg Intravenous Q6H PRN Agbata, Tochukwu, MD   4 mg at 09/03/20 1433  . polyethylene glycol (MIRALAX / GLYCOLAX) packet 17 g  17 g Oral Daily Agbata, Tochukwu, MD   17 g at 09/04/20 1044  . senna-docusate (Senokot-S) tablet 1 tablet  1 tablet Oral BID Jessica Reasons, MD   1 tablet at 09/04/20 2127  . simvastatin (ZOCOR) tablet 10 mg  10 mg Oral QHS Agbata, Tochukwu, MD   10 mg at 09/04/20 2125  . sodium chloride (OCEAN) 0.65 % nasal spray 2 spray  2 spray Each Nare PRN Agbata, Tochukwu, MD      . tamsulosin (FLOMAX) capsule 0.4 mg  0.4 mg Oral Daily Jessica Reasons, MD   0.4 mg at 09/04/20 1043  . traMADol (ULTRAM) tablet 50 mg  50 mg Oral Daily Agbata, Tochukwu, MD   50 mg at 09/04/20 1043  . vitamin B-12 (CYANOCOBALAMIN) tablet 250  mcg  250 mcg Oral Daily Agbata, Tochukwu, MD   250 mcg at 09/04/20 1047  . Warfarin - Pharmacist Dosing Inpatient   Does not apply q1600 Jessica Long, Jessica Long      . zolpidem [facility]) tablet 2.5 mg  2.5 mg Oral QHS Agbata, Tochukwu, MD   2.5 mg at 09/04/20 2127   Objective: Vital signs in last 24 hours: Temp:  [97.5 F (36.4 C)-98.2 F (36.8 C)] 97.5 F (36.4 C) (05/02 0815) Pulse Rate:  [66-83] 66 (05/02 0815) Resp:  [15-24] 18 (05/02 0815) BP: (110-135)/(66-93) 121/76 (05/02 0815) SpO2:  [94 %-97 %] 96 % (05/02 0815)  Intake/Output from previous day: 05/01 0701 - 05/02 0700 In: 160.1 [P.O.:60; I.V.:100.1] Out: 1600 [Urine:1600] Intake/Output this shift: No intake/output data recorded.  Physical Exam Vitals and nursing note reviewed.  Constitutional:      General: She is not in acute distress.    Appearance: She is not ill-appearing, toxic-appearing or diaphoretic.  HENT:     Head: Normocephalic and atraumatic.  Pulmonary:     Effort: Pulmonary effort is normal. No respiratory distress.  Skin:    General: Skin is warm and dry.  Neurological:     Mental Status: She is alert and oriented to person, place, and time.  Psychiatric:        Mood and Affect: Mood normal.        Behavior: Behavior normal.    Lab Results:  Recent Labs    09/04/20 0436 09/05/20 0426  WBC 21.7* 19.6*  HGB 9.8* 10.2*  HCT 29.3* 30.1*  PLT 229 287   BMET Recent Labs    09/04/20 0436 09/05/20 0426  NA 131* 132*  K 4.6 4.1  CL 102 103  CO2 22 23  GLUCOSE 131* 103*  BUN 33* 38*  CREATININE 1.60* 1.49*  CALCIUM 8.5* 8.5*   PT/INR Recent Labs    09/04/20 0436 09/05/20 0426  LABPROT 25.5* 23.0*  INR 2.3* 2.0*   Assessment & Plan: 75 year old female POD 2 from left ureteral stent placement with Jessica Long for management of an 18 mm left UPJ stone with urosepsis, now extubated and clinically improving.  On culture appropriate antibiotics for nitrofurantoin resistant Proteus urinary  infection.  She is having some mild stent discomfort today.  Agree with  daily Flomax for ureteral dilation.  May also consider low dose oxybutynin XL daily for management of stent discomfort.  As she has been afebrile for 24 hours, okay to discontinue Foley catheter today.  I explained that she will require outpatient ureteroscopy with laser lithotripsy and stent exchange in 2 to 3 weeks.  She expressed understanding.  Recommendations: -Discontinue Foley catheter -Continue Flomax, consider oxybutynin XL for management of stent discomfort -Continue antibiotics for total of 14 days of culture appropriate therapy -Outpatient left ureteroscopy with laser lithotripsy and stent exchange in 2 to 3 weeks  Debroah Loop, PA-C 09/05/2020

## 2020-09-05 NOTE — Progress Notes (Signed)
PROGRESS NOTE    Jessica Long  YTK:[id number] DOB: [birth year] DOA: 08/31/2020 PCP: Orvis Brill, [facility]    Chief Complaint  Patient presents with  . Shortness of Breath    Brief Narrative:  Jessica Long is a 75 y.o. female with medical history significant for CVA, history of A. fib on chronic anticoagulation with Coumadin, chronic diastolic dysfunction CHF, dyslipidemia and chronic kidney disease who was sent to the ER for evaluation of shortness of breath. Patient states she has had symptoms for 3 weeks, states that she is short of breath at rest but worse with exertion.  She has had an occasional cough productive of clear phlegm   initial work around respiratory which was unrevealing, she is found to have left hydronephrosis from obstructing stone and developed septic shock , s/p stent placement, improving   Subjective: Pt complained that we tired her out.  Dyspnea improved.  No BM yet.   Assessment & Plan:   Principal Problem:   Shortness of breath Active Problems:   AKI (acute kidney injury) ([place])   Atrial fibrillation (HCC)   Depression   COPD (chronic obstructive pulmonary disease) with emphysema (HCC)   Hydronephrosis   Obstructive left UPJ stone with left hydronephrosis/acute urinary retention/ AKI/likely developing sepsis/septic shock -she presents with progressive Sob/weakness for the last three weeks, initial work around respiratory which was unrevealing, renal US done due to [facility] showed left hydronephrosis and obstructive left upj stone, CT renal stone study confirmed finding --She developed septic shock on 4/30 AM with hypotension, hypoxia, tachycardia, she received fluids resuscitation, ICU consulted -s/p left JJ stent placement on 4/30 am, "a large amount of purulent urine seen draining around and through the stent" culture sent -she is transferred to icu post op, she was successfully extubated around 6 PM on 4/30, she did not require pressors, she is  transferred out of ICU on 5/1 -empirically Started on aztreonam on 4/29( h/o ampicillin allergy), urine culture Proteus sensitive to Cipro, case discussed with pharmacy, change antibiotic to Cipro, pharmacy to dose Plan: --cont cipro --d/c Foley today  Dyspnea 2/2 pulm edema --IV lasix 40 mg today  Hyponatremia, improved -initially appear dry, diuretic held, now appear volume overloaded, prn lasix  Paroxysmal A. Fib on coumadin -Rate controlled  --cont metop --pharm to dose coumadin  Supratherapeutic INR INR 4.8 on presentation Likely due to poor oral intake and sepsis, No sign of bleeding Coumadin held on admission. 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 Up at 70 degreee with oral intake --cont pepcid  emphysema New findings on CT chest, no prior diagnosis Prior smoker, no wheezing on exam  Chronic knee pain -on tylenol 1000mg  tid and tramadol daily at home --cont home regimen  Constipation: Cont MiraLAX and Senokot  Deconditioning from ALF, She used to walk twice a day, recently has not walked due to sob and weakness  PT/OT recommend SNF   The patient's BMI is: Body mass index is 27.79 kg/m.Marland Kitchen     Unresulted Labs (From admission, onward)          Start     Ordered   09/06/20 0500  CBC  Daily,   R  Question:  Specimen collection method  Answer:  Lab=Lab collect   09/05/20 1913   09/06/20 0500  Magnesium  Daily,   R     Question:  Specimen collection method  Answer:  Lab=Lab collect   09/05/20 1913   09/05/20 0500  Expectorated Sputum Assessment w Gram Stain, Rflx to Resp Cult  Once,   R        09/05/20 0500    09/04/20 4742  Basic metabolic panel  Daily,   R      09/03/20 0821   09/01/20 0500  Protime-INR  Daily,   STAT (with R occurrences)      08/31/20 1210            DVT prophylaxis:  on chronic Coumadin   Code Status: Full Family Communication: Disposition:   Dispo: The patient is from: ALF              Anticipated d/c is to: SNF              Anticipated d/c date is: whenever bed available                Consultants:   Urology, Dr. Lovena Neighbours  Critical care  Procedures:   In and out cath for urinary retention   Left JJ stent placement on 4/30   Antimicrobials:   Anti-infectives (From admission, onward)   Start     Dose/Rate Route Frequency Ordered Stop   09/04/20 1400  ciprofloxacin (CIPRO) tablet 500 mg        500 mg Oral 2 times daily 09/04/20 1325     09/04/20 1200  aztreonam (AZACTAM) 1 g in sodium chloride 0.9 % 100 mL IVPB  Status:  Discontinued        1 g 200 mL/hr over 30 Minutes Intravenous Every 8 hours 09/04/20 0819 09/04/20 1325   09/03/20 0930  ciprofloxacin (CIPRO) IVPB 200 mg  Status:  Discontinued       Note to Pharmacy: Give pre-op hour   200 mg 100 mL/hr over 60 Minutes Intravenous Every 12 hours 09/03/20 0830 09/04/20 1023   09/02/20 2000  aztreonam (AZACTAM) 0.5 g in dextrose 5 % 50 mL IVPB  Status:  Discontinued        0.5 g 100 mL/hr over 30 Minutes Intravenous Every 8 hours 09/02/20 1843 09/04/20 0819          Objective: Vitals:   09/05/20 0815 09/05/20 1600 09/05/20 2012 09/06/20 0006  BP: 121/76 106/67 120/77 100/61  Pulse: 66 61 64 60  Resp: 18 18 18 16   Temp: (!) 97.5 F (36.4 C) 98.1 F (36.7 C) 97.7 F (36.5 C) 98.1 F (36.7 C)  TempSrc: Oral Oral Oral Oral  SpO2: 96% 93% 93% 94%  Weight:      Height:        Intake/Output Summary (Last 24 hours) at 09/06/2020 0205 Last data filed at 09/06/2020 0006 Gross per 24 hour  Intake 360 ml  Output 1850 ml  Net -1490 ml   Filed Weights   08/31/20 0655 09/03/20 1245  Weight:  82.9 kg 82.9 kg    Examination:  Constitutional: NAD, AAOx3 HEENT: conjunctivae and lids normal, EOMI CV: No cyanosis.   RESP: normal respiratory effort, on RA Extremities: No effusions, edema in BLE SKIN: warm, dry Neuro: II - XII grossly intact.   Psych: Normal mood and affect.  Appropriate judgement and reason   Data Reviewed: I have personally reviewed following labs and imaging  studies  CBC: Recent Labs  Lab 08/31/20 0703 09/01/20 0519 09/02/20 0527 09/03/20 0835 09/04/20 0436 09/05/20 0426  WBC 16.7* 15.8* 19.7* 19.8* 21.7* 19.6*  NEUTROABS 13.6*  --  16.2* 17.2* 19.0* 15.6*  HGB 12.6 12.4 10.7* 10.8* 9.8* 10.2*  HCT 37.4 36.8 33.1* 31.6* 29.3* 30.1*  MCV 87.6 88.9 89.5 86.8 87.7 86.7  PLT 264 239 227 219 229 109    Basic Metabolic Panel: Recent Labs  Lab 09/01/20 0519 09/02/20 0527 09/03/20 0835 09/04/20 0436 09/05/20 0426  NA 131* 131* 128* 131* 132*  K 4.0 4.0 4.1 4.6 4.1  CL 95* 97* 99 102 103  CO2 24 25 21* 22 23  GLUCOSE 86 101* 104* 131* 103*  BUN 35* 31* 28* 33* 38*  CREATININE 2.22* 1.99* 2.02* 1.60* 1.49*  CALCIUM 8.5* 8.6* 8.0* 8.5* 8.5*  MG  --  2.3  --  2.3 2.0  PHOS  --   --   --  3.7  --     GFR: Estimated Creatinine Clearance: 35.7 mL/min (A) (by C-G formula based on SCr of 1.49 mg/dL (H)).  Liver Function Tests: No results for input(s): AST, ALT, ALKPHOS, BILITOT, PROT, ALBUMIN in the last 168 hours.  CBG: No results for input(s): GLUCAP in the last 168 hours.   Recent Results (from the past 240 hour(s))  Resp Panel by RT-PCR (Flu A&B, Covid) Nasopharyngeal Swab     Status: None   Collection Time: 08/31/20 11:27 AM   Specimen: Nasopharyngeal Swab; Nasopharyngeal(NP) swabs in vial transport medium  Result Value Ref Range Status   SARS Coronavirus 2 by RT PCR NEGATIVE NEGATIVE Final    Comment: (NOTE) SARS-CoV-2 target nucleic acids are NOT DETECTED.  The SARS-CoV-2 RNA is generally detectable in upper  respiratory specimens during the acute phase of infection. The lowest concentration of SARS-CoV-2 viral copies this assay can detect is 138 copies/mL. A negative result does not preclude SARS-Cov-2 infection and should not be used as the sole basis for treatment or other patient management decisions. A negative result may occur with  improper specimen collection/handling, submission of specimen other than nasopharyngeal swab, presence of viral mutation(s) within the areas targeted by this assay, and inadequate number of viral copies(<138 copies/mL). A negative result must be combined with clinical observations, patient history, and epidemiological information. The expected result is Negative.  Fact Sheet for Patients:  EntrepreneurPulse.com.au  Fact Sheet for Healthcare Providers:  IncredibleEmployment.be  This test is no t yet approved or cleared by the [place] FDA and  has been authorized for detection and/or diagnosis of SARS-CoV-2 by FDA under an Emergency Use Authorization (EUA). This EUA will remain  in effect (meaning this test can be used) for the duration of the COVID-19 declaration under Section 564(b)(1) of the Act, 21 U.S.C.section 360bbb-3(b)(1), unless the authorization is terminated  or revoked sooner.       Influenza A by PCR NEGATIVE NEGATIVE Final   Influenza B by PCR NEGATIVE NEGATIVE Final    Comment: (NOTE) The Xpert Xpress SARS-CoV-2/FLU/RSV plus assay is intended as an aid in the diagnosis of influenza from Nasopharyngeal swab specimens and should not be used as a sole basis for treatment. Nasal washings and aspirates are unacceptable for Xpert Xpress SARS-CoV-2/FLU/RSV testing.  Fact Sheet for Patients: EntrepreneurPulse.com.au  Fact Sheet for Healthcare Providers: IncredibleEmployment.be  This test is not yet approved or cleared by the [place] FDA and has been  authorized for detection and/or diagnosis of SARS-CoV-2 by FDA  under an Emergency Use Authorization (EUA). This EUA will remain in effect (meaning this test can be used) for the duration of the COVID-19 declaration under Section 564(b)(1) of the Act, 21 U.S.C. section 360bbb-3(b)(1), unless the authorization is terminated or revoked.  Performed at [facility], [address] [place], [place] [address]   MRSA PCR Screening     Status: None   Collection Time: 08/31/20  1:25 PM   Specimen: Nasopharyngeal  Result Value Ref Range Status   MRSA by PCR NEGATIVE NEGATIVE Final    Comment:        The GeneXpert MRSA Assay (FDA approved for NASAL specimens only), is one component of a comprehensive MRSA colonization surveillance program. It is not intended to diagnose MRSA infection nor to guide or monitor treatment for MRSA infections. Performed at [facility], [address] [place], [place] [address]   Urine Culture     Status: Abnormal   Collection Time: 09/01/20  3:15 PM   Specimen: Urine, Random  Result Value Ref Range Status   Specimen Description   Final    URINE, RANDOM Performed at [facility], [address] [place], [place] [address]    Special Requests   Final    NONE Performed at [facility], [address] [place], [place] [address]    Culture >=100,000 COLONIES/mL PROTEUS MIRABILIS (A)  Final   Report Status 09/04/2020 FINAL  Final   Organism ID, Bacteria PROTEUS MIRABILIS (A)  Final      Susceptibility   Proteus mirabilis - MIC*    AMPICILLIN <=2 SENSITIVE Sensitive     CEFAZOLIN <=4 SENSITIVE Sensitive     CEFEPIME <=0.12 SENSITIVE Sensitive     CEFTRIAXONE <=0.25 SENSITIVE Sensitive     CIPROFLOXACIN <=0.25 SENSITIVE Sensitive     GENTAMICIN <=1 SENSITIVE Sensitive     IMIPENEM 2 SENSITIVE Sensitive     NITROFURANTOIN 128 RESISTANT Resistant     TRIMETH/SULFA <=20 SENSITIVE Sensitive     AMPICILLIN/SULBACTAM  <=2 SENSITIVE Sensitive     PIP/TAZO <=4 SENSITIVE Sensitive     * >=100,000 COLONIES/mL PROTEUS MIRABILIS  CULTURE, BLOOD (ROUTINE X 2) w Reflex to ID Panel     Status: None (Preliminary result)   Collection Time: 09/02/20  9:10 AM   Specimen: BLOOD  Result Value Ref Range Status   Specimen Description BLOOD BLOOD LEFT ARM  Final   Special Requests   Final    BOTTLES DRAWN AEROBIC AND ANAEROBIC Blood Culture adequate volume   Culture   Final    NO GROWTH 3 DAYS Performed at [facility], [address], [place], [place] [address]    Report Status PENDING  Incomplete  CULTURE, BLOOD (ROUTINE X 2) w Reflex to ID Panel     Status: None (Preliminary result)   Collection Time: 09/02/20  9:10 AM   Specimen: BLOOD  Result Value Ref Range Status   Specimen Description BLOOD BLOOD LEFT FOREARM  Final   Special Requests   Final    BOTTLES DRAWN AEROBIC AND ANAEROBIC Blood Culture adequate volume   Culture   Final    NO GROWTH 3 DAYS Performed at [facility], [address], [place], [place] [address]    Report Status PENDING  Incomplete  Urine Culture     Status: Abnormal   Collection Time: 09/03/20 10:28 AM   Specimen: PATH Cytology Urine  Result Value Ref Range Status   Specimen Description   Final  URINE, RANDOM Performed at [facility], [address] [place], [place] [address]    Special Requests   Final    NONE Performed at [facility], [place], [place] [address]    Culture 60,000 COLONIES/mL PROTEUS MIRABILIS (A)  Final   Report Status 09/05/2020 FINAL  Final   Organism ID, Bacteria PROTEUS MIRABILIS (A)  Final      Susceptibility   Proteus mirabilis - MIC*    AMPICILLIN <=2 SENSITIVE Sensitive     CEFAZOLIN <=4 SENSITIVE Sensitive     CEFEPIME <=0.12 SENSITIVE Sensitive     CEFTRIAXONE <=0.25 SENSITIVE Sensitive     CIPROFLOXACIN <=0.25 SENSITIVE Sensitive     GENTAMICIN <=1 SENSITIVE Sensitive     IMIPENEM  2 SENSITIVE Sensitive     NITROFURANTOIN 128 RESISTANT Resistant     TRIMETH/SULFA <=20 SENSITIVE Sensitive     AMPICILLIN/SULBACTAM <=2 SENSITIVE Sensitive     PIP/TAZO <=4 SENSITIVE Sensitive     * 60,000 COLONIES/mL PROTEUS MIRABILIS  SARS CORONAVIRUS 2 (TAT 6-24 HRS) Nasopharyngeal Nasopharyngeal Swab     Status: None   Collection Time: 09/05/20  2:06 PM   Specimen: Nasopharyngeal Swab  Result Value Ref Range Status   SARS Coronavirus 2 NEGATIVE NEGATIVE Final    Comment: (NOTE) SARS-CoV-2 target nucleic acids are NOT DETECTED.  The SARS-CoV-2 RNA is generally detectable in upper and lower respiratory specimens during the acute phase of infection. Negative results do not preclude SARS-CoV-2 infection, do not rule out co-infections with other pathogens, and should not be used as the sole basis for treatment or other patient management decisions. Negative results must be combined with clinical observations, patient history, and epidemiological information. The expected result is Negative.  Fact Sheet for Patients: SugarRoll.be  Fact Sheet for Healthcare Providers: https://www.woods-mathews.com/  This test is not yet approved or cleared by the [place] FDA and  has been authorized for detection and/or diagnosis of SARS-CoV-2 by FDA under an Emergency Use Authorization (EUA). This EUA will remain  in effect (meaning this test can be used) for the duration of the COVID-19 declaration under Se ction 564(b)(1) of the Act, 21 U.S.C. section 360bbb-3(b)(1), unless the authorization is terminated or revoked sooner.  Performed at [facility], [address] [place], [place] [address]          Radiology Studies: DG Chest Port 1 View  Result Date: 09/04/2020 CLINICAL DATA:  Shortness of breath EXAM: PORTABLE CHEST 1 VIEW COMPARISON:  CT 08/31/2020, radiograph 06/10/2014 FINDINGS: There are some chronically coarsened  interstitial changes in both lungs. Some increasing pulmonary vascular congestion with fissural and septal thickening and small left pleural effusion could reflect developing pulmonary edema. Focal consolidative opacity. Stable cardiomediastinal contours with a calcified, tortuous aorta. No acute osseous or soft tissue abnormality. Degenerative changes are present in the imaged spine and shoulders. Telemetry leads overlie the chest. IMPRESSION: Increasing pulmonary vascular congestion with interstitial changes suggestive of developing pulmonary edema on a background of chronic interstitial change and atelectasis. Small left effusion as well. Aortic Atherosclerosis (ICD10-I70.0). Electronically Signed   By: Lovena Le M.D.   On: 09/04/2020 06:29        Scheduled Meds: . acetaminophen  1,000 mg Oral Q8H  . buPROPion  300 mg Oral Daily  . Chlorhexidine Gluconate Cloth  6 each Topical Daily  . ciprofloxacin  500 mg Oral BID  . famotidine  10 mg Oral QHS  . fluticasone  2 spray  Each Nare Daily  . guaiFENesin  600 mg Oral BID  . loratadine  10 mg Oral Daily  . mouth rinse  15 mL Mouth Rinse BID  . metoprolol tartrate  12.5 mg Oral BID  . mometasone-formoterol  2 puff Inhalation BID  . multivitamin with minerals  1 tablet Oral Daily  . polyethylene glycol  17 g Oral Daily  . senna-docusate  1 tablet Oral BID  . simvastatin  10 mg Oral QHS  . tamsulosin  0.4 mg Oral Daily  . traMADol  50 mg Oral Daily  . vitamin B-12  250 mcg Oral Daily  . Warfarin - Pharmacist Dosing Inpatient   Does not apply q1600  . zolpidem  2.5 mg Oral QHS   Continuous Infusions:    LOS: 3 days    Enzo Bi, MD PhD FACP [facility]    09/06/2020, 2:05 AM

## 2020-09-05 NOTE — Progress Notes (Signed)
[place] for Warfarin Indication: atrial fibrillation  Patient Measurements: Height: 5\' 8"  (172.7 cm) Weight: 82.9 kg (182 lb 12.2 oz) IBW/kg (Calculated) : 63.9  Labs: Recent Labs    09/03/20 0501 09/03/20 0835 09/03/20 0835 09/04/20 0436 09/05/20 0426  HGB  --  10.8*   < > 9.8* 10.2*  HCT  --  31.6*  --  29.3* 30.1*  PLT  --  219  --  229 287  LABPROT 31.4*  --   --  25.5* 23.0*  INR 3.0*  --   --  2.3* 2.0*  CREATININE  --  2.02*  --  1.60* 1.49*   < > = values in this interval not displayed.    Estimated Creatinine Clearance: 35.7 mL/min (A) (by C-G formula based on SCr of 1.49 mg/dL (H)).   Medical History: Past Medical History:  Diagnosis Date  . Allergic rhinitis   . Atrial fibrillation ([place])   . CHF (congestive heart failure) ([place])   . Depression   . Hyperlipemia   . Osteoarthritis   . Renal disorder     Assessment: 76 y.o.femalewith medical history significant forCVA, history of A. fib on chronic anticoagulation with Coumadin, chronic diastolic dysfunction CHF, dyslipidemia and chronic kidney disease who was sent to the ER for evaluation of shortness of breath. Warfarin was held on admission due to being admitted with a supratherapeutic INR. Her home regimen of warfarin is 2 mg daily. Last patient reported dose was on 4/26 prior to admission.   4/27 INR 3.9 Dose held 4/28 INR 4.8 Dose held 4/29 INR 3.5 Dose held 4/30 INR 3.0 Dose held 5/01 INR 2.3 Warfarin 2 mg 5/02 INR 2.0  DDI: APAP, ciprofloxacin  Unclear exactly why patient was supratherapeutic on admission. Possibly secondary to poor PO intake. LFTs have not been checked.   Goal of Therapy:  INR 2 - 3 Monitor platelets by anticoagulation protocol: Yes   Plan:   INR therapeutic.Will give warfarin 1 mg x 1 tonight as pt started on Cipro. DDI with Warfarin.  Monitor closely as expect patient is warfarin sensitive given supratherapeutic INR on admission  and now on ciprofloxacin. Though may take a couple of days for this to reflect given missed doses. INR should be monitored closely upon discharge while on antibiotics  Daily INR to guide treatment plan  CBC at least every 3 days per protocol  Cressie Betzler A 09/05/2020,9:36 AM

## 2020-09-06 DIAGNOSIS — R0602 Shortness of breath: Secondary | ICD-10-CM | POA: Diagnosis not present

## 2020-09-06 LAB — CBC
HCT: 31.4 % — ABNORMAL LOW (ref 36.0–46.0)
Hemoglobin: 10.6 g/dL — ABNORMAL LOW (ref 12.0–15.0)
MCH: 29.5 pg (ref 26.0–34.0)
MCHC: 33.8 g/dL (ref 30.0–36.0)
MCV: 87.5 fL (ref 80.0–100.0)
Platelets: 328 10*3/uL (ref 150–400)
RBC: 3.59 MIL/uL — ABNORMAL LOW (ref 3.87–5.11)
RDW: 15.3 % (ref 11.5–15.5)
WBC: 14.6 10*3/uL — ABNORMAL HIGH (ref 4.0–10.5)
nRBC: 0 % (ref 0.0–0.2)

## 2020-09-06 LAB — EXPECTORATED SPUTUM ASSESSMENT W GRAM STAIN, RFLX TO RESP C

## 2020-09-06 LAB — MAGNESIUM: Magnesium: 1.8 mg/dL (ref 1.7–2.4)

## 2020-09-06 LAB — BASIC METABOLIC PANEL
Anion gap: 7 (ref 5–15)
BUN: 31 mg/dL — ABNORMAL HIGH (ref 8–23)
CO2: 26 mmol/L (ref 22–32)
Calcium: 8.5 mg/dL — ABNORMAL LOW (ref 8.9–10.3)
Chloride: 100 mmol/L (ref 98–111)
Creatinine, Ser: 1.39 mg/dL — ABNORMAL HIGH (ref 0.44–1.00)
GFR, Estimated: 39 mL/min — ABNORMAL LOW (ref >60)
Glucose, Bld: 76 mg/dL (ref 70–99)
Potassium: 3.9 mmol/L (ref 3.5–5.1)
Sodium: 133 mmol/L — ABNORMAL LOW (ref 135–145)

## 2020-09-06 LAB — PROTIME-INR
INR: 2 — ABNORMAL HIGH (ref 0.8–1.2)
Prothrombin Time: 22.4 seconds — ABNORMAL HIGH (ref 11.4–15.2)

## 2020-09-06 LAB — SARS CORONAVIRUS 2 (TAT 6-24 HRS): SARS Coronavirus 2: NEGATIVE

## 2020-09-06 MED ORDER — FAMOTIDINE 10 MG PO TABS
10.0000 mg | ORAL_TABLET | Freq: Every day | ORAL | Status: AC
Start: 2020-09-06 — End: ?

## 2020-09-06 MED ORDER — ACETAMINOPHEN 500 MG PO TABS: 1000.0000 mg | ORAL_TABLET | Freq: Three times a day (TID) | ORAL | 0 refills | Status: DC | PRN

## 2020-09-06 MED ORDER — CIPROFLOXACIN HCL 500 MG PO TABS: 500.0000 mg | ORAL_TABLET | Freq: Two times a day (BID) | ORAL | 0 refills | Status: AC

## 2020-09-06 MED ORDER — CIPROFLOXACIN HCL 500 MG PO TABS: 500.0000 mg | ORAL_TABLET | Freq: Two times a day (BID) | ORAL | 0 refills | Status: DC

## 2020-09-06 MED ORDER — WARFARIN SODIUM 1 MG PO TABS
1.0000 mg | ORAL_TABLET | Freq: Once | ORAL | Status: DC
  Filled 2020-09-06: qty 1

## 2020-09-06 MED ORDER — WARFARIN SODIUM 1 MG PO TABS: 1.0000 mg | ORAL_TABLET | Freq: Every day | ORAL | Status: DC

## 2020-09-06 MED ORDER — FUROSEMIDE 10 MG/ML IJ SOLN
40.0000 mg | Freq: Once | INTRAMUSCULAR | Status: AC
  Administered 2020-09-06: 40 mg via INTRAVENOUS
  Filled 2020-09-06: qty 4

## 2020-09-06 MED ORDER — ZOLPIDEM TARTRATE 5 MG PO TABS: 2.5000 mg | ORAL_TABLET | Freq: Every evening | ORAL | 0 refills | Status: DC | PRN

## 2020-09-06 MED ORDER — POLYETHYLENE GLYCOL 3350 17 G PO PACK
34.0000 g | PACK | ORAL | Status: DC
  Administered 2020-09-06 (×4): 34 g via ORAL
  Filled 2020-09-06 (×4): qty 2

## 2020-09-06 MED ORDER — TAMSULOSIN HCL 0.4 MG PO CAPS
0.4000 mg | ORAL_CAPSULE | Freq: Every day | ORAL | Status: AC
Start: 2020-09-06 — End: ?

## 2020-09-06 MED ORDER — METOPROLOL TARTRATE 25 MG PO TABS: 12.5000 mg | ORAL_TABLET | Freq: Two times a day (BID) | ORAL | Status: AC

## 2020-09-06 MED ORDER — POLYETHYLENE GLYCOL 3350 17 G PO PACK: 34.0000 g | PACK | Freq: Every day | ORAL | 0 refills | Status: AC | PRN

## 2020-09-06 NOTE — Plan of Care (Signed)
Patient discharged to [facility] per MD orders at this time.All discharge instructions,education and medications reviewed with Pt at bedside.Pt expressed unders- tanding and will comply with dc instructions.Pt denies any pain or discomfort.patient was transported by [facility] transport.no verbal c/o or any ssx of distress.

## 2020-09-06 NOTE — Discharge Summary (Signed)
Physician Discharge Summary   Jessica Long  female DOB: [birth year]  LZJ:[id number]  PCP: [facility]  Admit date: 08/31/2020 Discharge date: 09/06/2020  Admitted From: ALF Disposition:  SNF CODE STATUS: Full code  Discharge Instructions    Discharge instructions   Complete by: As directed    Need INR check for coumadin adjustment on 06/12/20.  Outpatient with Dr. Bernardo Heater for left ureteroscopy with laser lithotripsy and stent exchange in 2 to 3 weeks. - -   No wound care   Complete by: As directed        Hospital Course:  For full details, please see H&P, progress notes, consult notes and ancillary notes.  Briefly,  Jessica Morell Nelsonis a 75 y.o.femalewith medical history significant forCVA, history of A. fib on chronic anticoagulation with Coumadin, chronic diastolic dysfunction CHF, dyslipidemia and chronic kidney disease who was sent to the ER for evaluation of shortness of breath.  Initial work around respiratory was unrevealing, then she was found to have left hydronephrosis from obstructing stone and developed septic shock.  Stent placed, abx started, pt was improving.  Obstructive left UPJ stone with left hydronephrosis acute urinary retention AKI Septic Shock --She developed hypotension on 4/30 AM with hypoxia, tachycardia, leukocytosis.  she received fluids resuscitation. -Pt received left JJ stent placement on 4/30 am, with "a large amount of purulent urine seen draining around and through the stent"  --she is transferred to icu post op, she was successfully extubated around 6 PM on 4/30, she did not require pressors, she was transferred out of ICU on 5/1 -empirically Started on aztreonam on 4/29 (h/o ampicillin allergy), urine culture Proteus sensitive to Cipro, case discussed with pharmacy, change antibiotic to Cipro.  Pt will continue cipro at discharge to finish a 14-day course, per urology rec.  Pt will follow up with Dr. Bernardo Heater for left  ureteroscopy with laser lithotripsy and stent exchange in 2 to 3 weeks in clinic.  Acute hypoxic respiratory failure 2/2 septic shock Pulm edema -initially appear dry, diuretic held, then pt started on daily IV lasix on 5/1.  Pt was discharged on home torsemide.  Pt was on room air prior to discharge.  Hyponatremia, improved Na 133 on the day of discharge.  Paroxysmal A. Fib on coumadin -Rate controlled.   --cont metop and coumadin  Supratherapeutic INR INR 4.8 on presentation Likely due to poor oral intake and sepsis. No sign of bleeding. Due to possible interaction between coumadin and Cipro, pt was discharged on coumadin 1 mg daily (was on 2 mg daily PTA), and will need INR check on 06/12/20.  Chronic diastolic CHF -initially appear dry, diuretic held, then pt started on daily IV lasix on 5/1.  Pt was discharged on home torsemide.    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  Chronic knee pain Cont tylenol PRN  Constipation Cont MiraLAX PRN  Deconditioning from ALF, She used to walk twice a day, recently has not walked due to sob and weakness PT/OT recommend SNF   The patient's BMI is: Body mass index is 27.79 kg/m.   Discharge Diagnoses:  Principal Problem:   Shortness of breath Active Problems:   AKI (  acute kidney injury) ([place])   Atrial fibrillation (HCC)   Depression   COPD (chronic obstructive pulmonary disease) with emphysema (HCC)   Hydronephrosis   30 Day Unplanned Readmission Risk Score   Flowsheet Row ED to Hosp-Admission (Current) from 08/31/2020 in [place] (1A)   30 Day Unplanned Readmission Risk Score (%) 24.09 Filed at 09/06/2020 0801     This score is the patient's risk of an unplanned readmission within 30 days of being discharged (0 -100%). The score is based on dignosis, age, lab data, medications, orders, and past utilization.   Low:  0-14.9   Medium: 15-21.9   High: 22-29.9   Extreme: 30 and above        Discharge Instructions:  Allergies as of 09/06/2020      Reactions   Amoxicillin Swelling   Oral swelling   Ampicillin Swelling   Oral swelling      Medication List    STOP taking these medications   traMADol 50 MG tablet Commonly known as: ULTRAM     TAKE these medications   acetaminophen 500 MG tablet Commonly known as: TYLENOL Take 2 tablets (1,000 mg total) by mouth every 8 (eight) hours as needed for moderate pain or headache. What changed:   when to take this  reasons to take this   buPROPion 300 MG 24 hr tablet Commonly known as: WELLBUTRIN XL Take 300 mg by mouth daily.   cetirizine 10 MG tablet Commonly known as: ZYRTEC Take 10 mg by mouth daily.   ciprofloxacin 500 MG tablet Commonly known as: CIPRO Take 1 tablet (500 mg total) by mouth 2 (two) times daily for 9 days.   famotidine 10 MG tablet Commonly known as: PEPCID Take 1 tablet (10 mg total) by mouth at bedtime.   metoprolol tartrate 25 MG tablet Commonly known as: LOPRESSOR Take 0.5 tablets (12.5 mg total) by mouth 2 (two) times daily. What changed: when to take this   multivitamin tablet Take 1 tablet by mouth daily.   polyethylene glycol 17 g packet Commonly known as: MIRALAX / GLYCOLAX Take 34 g by mouth daily as needed.   potassium chloride SA 20 MEQ tablet Commonly known as: KLOR-CON Take 20 mEq by mouth daily.   selenium sulfide 1 % Lotn Commonly known as: SELSUN Apply 1 application topically daily.   simvastatin 10 MG tablet Commonly known as: ZOCOR Take 10 mg by mouth at bedtime.   sodium chloride 0.65 % Soln nasal  spray Commonly known as: OCEAN Place 2 sprays into both nostrils as needed.   tamsulosin 0.4 MG Caps capsule Commonly known as: FLOMAX Take 1 capsule (0.4 mg total) by mouth daily.   torsemide 20 MG tablet Commonly known as: DEMADEX Take 60 mg by mouth daily.   vitamin B-12 250 MCG tablet Commonly known as: CYANOCOBALAMIN Take 250 mcg by mouth daily.   warfarin 1 MG tablet Commonly known as: COUMADIN Take 1 tablet (1 mg total) by mouth daily. What changed:   medication strength  how much to take   zolpidem 5 MG tablet Commonly known as: AMBIEN Take 0.5 tablets (2.5 mg total) by mouth at bedtime as needed for sleep. What changed:   when to take this  reasons to take this        Contact information for follow-up providers    Stoioff, Ronda Fairly, MD Follow up in 2 week(s).   Specialty: Urology Why: Outpatient left ureteroscopy with laser lithotripsy and stent exchange in  2 to 3 weeks Contact information: [place] [place] [address] [phone]        [facility] Schedule an appointment as soon as possible for a visit in 1 week(s).   Specialty: Geriatric Medicine Contact information: [address] [place] [address] [phone]            Contact information for after-discharge care    Destination    [facility] Preferred SNF .   Service: Skilled Nursing Contact information: [place] [phone]                  Allergies  Allergen Reactions  . Amoxicillin Swelling    Oral swelling  . Ampicillin Swelling    Oral swelling     The results of significant diagnostics from this hospitalization (including imaging, microbiology, ancillary and laboratory) are listed below for reference.   Consultations:   Procedures/Studies: CT Chest Wo Contrast  Result Date: 08/31/2020 CLINICAL DATA:  Chest pain. EXAM: CT CHEST WITHOUT  CONTRAST TECHNIQUE: Multidetector CT imaging of the chest was performed following the standard protocol without IV contrast. COMPARISON:  November 24, 2010. FINDINGS: Cardiovascular: No evidence of thoracic aortic aneurysm. Normal cardiac size. No pericardial effusion. Mild coronary artery calcifications are noted. Mediastinum/Nodes: Moderate sliding-type hiatal hernia. No adenopathy is noted. Thyroid gland is unremarkable. Lungs/Pleura: No pneumothorax or pleural effusion is noted. Mild bilateral posterior basilar subsegmental atelectasis is noted. Minimal emphysematous disease is noted bilaterally. Upper Abdomen: Mild inflammatory changes are seen around the upper pole of left kidney with probable calyceal dilatation of the upper pole. Musculoskeletal: No chest wall mass or suspicious bone lesions identified. IMPRESSION: Mild bilateral posterior basilar subsegmental atelectasis. Moderate size sliding-type hiatal hernia. Mild coronary artery calcifications are noted. Emphysema (ICD10-J43.9). Electronically Signed   By: Marijo Conception M.D.   On: 08/31/2020 08:22   US RENAL  Result Date: 09/02/2020 CLINICAL DATA:  Acute renal insufficiency EXAM: RENAL / URINARY TRACT ULTRASOUND COMPLETE COMPARISON:  CT abdomen pelvis 10/28/2010 FINDINGS: Right Kidney: Renal measurements: 10.9 x 5.5 x 4.8 cm = volume: 151 mL. Echogenicity within normal limits. No mass or hydronephrosis visualized. Left Kidney: Renal measurements: 10.7 x 6.4 x 5.6 cm = volume: 198 mL. Renal cortical thickness is preserved and cortical echogenicity is normal. There is moderate left hydronephrosis. A 19 mm terminal lower pole caliceal calculus is present. Additionally, a 17 mm obstructing calculus is seen within the right ureteropelvic junction. Bladder: Appears normal for degree of bladder distention. A right ureteral jet is identified. No left ureteral jet could be identified on this exam. Other: None. IMPRESSION: Moderate left hydronephrosis  secondary to an obstructing 17 mm calculus within the left ureteropelvic junction. Superimposed 19 mm terminal lower pole nonobstructing calculus. None Electronically Signed   By: Fidela Salisbury MD   On: 09/02/2020 02:48   DG Chest Port 1 View  Result Date: 09/04/2020 CLINICAL DATA:  Shortness of breath EXAM: PORTABLE CHEST 1 VIEW COMPARISON:  CT 08/31/2020, radiograph 06/10/2014 FINDINGS: There are some chronically coarsened interstitial changes in both lungs. Some increasing pulmonary vascular congestion with fissural and septal thickening and small left pleural effusion could reflect developing pulmonary edema. Focal consolidative opacity. Stable cardiomediastinal contours with a calcified, tortuous aorta. No acute osseous or soft tissue abnormality. Degenerative changes are present in the imaged spine and shoulders. Telemetry leads overlie the chest. IMPRESSION: Increasing pulmonary vascular congestion with interstitial changes  suggestive of developing pulmonary edema on a background of chronic interstitial change and atelectasis. Small left effusion as well. Aortic Atherosclerosis (ICD10-I70.0). Electronically Signed   By: Lovena Le M.D.   On: 09/04/2020 06:29   DG OR UROLOGY CYSTO IMAGE ([facility] ONLY)  Result Date: 09/03/2020 There is no interpretation for this exam.  This order is for images obtained during a surgical procedure.  Please See "Surgeries" Tab for more information regarding the procedure.   ECHOCARDIOGRAM COMPLETE  Result Date: 08/31/2020    ECHOCARDIOGRAM REPORT   Patient Name:   JANNETH KRASNER Date of Exam: 08/31/2020 Medical Rec #:  [id number]     Height:       68.0 in Accession #:    [id number]    Weight:       182.8 lb Date of Birth:  [birth year]     BSA:          1.967 m Patient Age:    36 years      BP:           159/77 mmHg Patient Gender: F             HR:           105 bpm. Exam Location:  [facility] Procedure: 2D Echo, Color Doppler and Cardiac Doppler Indications:     CHF-acute  diastolic U44.03  History:         Patient has no prior history of Echocardiogram examinations.                  CHF, Arrythmias:Atrial Fibrillation; Risk Factors:Dyslipidemia.  Sonographer:     Sherrie Sport RDCS (AE) Referring Phys:  [id number] Royce Macadamia AGBATA Diagnosing Phys: Serafina Royals MD  Sonographer Comments: Technically difficult study due to poor echo windows, no parasternal window, no apical window and suboptimal subcostal window. IMPRESSIONS  1. Left ventricular ejection fraction, by estimation, is 60 to 65%. The left ventricle has normal function. The left ventricle has no regional wall motion abnormalities. Left ventricular diastolic function could not be evaluated.  2. Right ventricular systolic function is normal. The right ventricular size is normal.  3. The mitral valve was not well visualized. Trivial mitral valve regurgitation.  4. The aortic valve was not well visualized. Aortic valve regurgitation is not visualized. FINDINGS  Left Ventricle: Left ventricular ejection fraction, by estimation, is 60 to 65%. The left ventricle has normal function. The left ventricle has no regional wall motion abnormalities. The left ventricular internal cavity size was normal in size. There is  no left ventricular hypertrophy. Left ventricular diastolic function could not be evaluated. Right Ventricle: The right ventricular size is normal. No increase in right ventricular wall thickness. Right ventricular systolic function is normal. Left Atrium: Left atrial size was normal in size. Right Atrium: Right atrial size was normal in size. Pericardium: There is no evidence of pericardial effusion. Mitral Valve: The mitral valve was not well visualized. Trivial mitral valve regurgitation. Tricuspid Valve: The tricuspid valve is not well visualized. Tricuspid valve regurgitation is trivial. Aortic Valve: The aortic valve was not well visualized. Aortic valve regurgitation is not visualized. Pulmonic Valve: The pulmonic valve  was not well visualized. Pulmonic valve regurgitation is not visualized. Aorta: The aortic root and ascending aorta are structurally normal, with no evidence of dilitation. IAS/Shunts: The interatrial septum was not assessed.  LEFT VENTRICLE PLAX 2D LVIDd:         4.04 cm LVIDs:  2.81 cm LV PW:         1.22 cm LV IVS:        1.37 cm  LEFT ATRIUM         Index LA diam:    3.10 cm 1.58 cm/m Serafina Royals MD Electronically signed by Serafina Royals MD Signature Date/Time: 08/31/2020/5:41:09 PM    Final    CT RENAL STONE STUDY  Result Date: 09/02/2020 CLINICAL DATA:  Hydronephrosis EXAM: CT ABDOMEN AND PELVIS WITHOUT CONTRAST TECHNIQUE: Multidetector CT imaging of the abdomen and pelvis was performed following the standard protocol without IV contrast. 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 Patchy bilateral lower lobe atelectasis. Trace left pleural fluid. Hepatobiliary: 2.2 cm cyst left lobe liver. No other focal liver abnormalities. No biliary dilation. The gallbladder is decompressed. Pancreas: Unremarkable. No pancreatic ductal dilatation or surrounding inflammatory changes. Spleen: Normal in size without focal abnormality. Adrenals/Urinary Tract: There is moderate to severe left-sided hydronephrosis due to an 18 mm obstructing left UPJ calculus. There is a nonobstructing 14 mm calculus within the lower pole left kidney. Mild left renal cortical thinning. There is mild right renal cortical atrophy and scarring. No right-sided calculi or obstruction. The adrenals and bladder are unremarkable. Stomach/Bowel: No bowel obstruction or ileus. Normal appendix right lower quadrant. No bowel wall thickening or inflammatory change. Small hiatal hernia unchanged. Vascular/Lymphatic: Aortic atherosclerosis. No enlarged abdominal or pelvic lymph nodes. Reproductive: Uterus and bilateral adnexa are unremarkable. Other: No free fluid or free gas.  No  abdominal wall hernia. Musculoskeletal: No acute or destructive bony lesions. Reconstructed images demonstrate no additional findings. IMPRESSION: 1. High-grade left-sided obstructive uropathy due to an 18 mm obstructing left UPJ calculus. 2. Nonobstructing 14 mm left renal calculus. 3. Bilateral renal cortical atrophy, with prominent areas of cortical scarring in the right kidney. 4. Patchy bibasilar atelectasis, with trace left pleural effusion. 5.  Aortic Atherosclerosis (ICD10-I70.0). Electronically Signed   By: Randa Ngo M.D.   On: 09/02/2020 19:01      Labs: BNP (last 3 results) Recent Labs    08/31/20 0703  BNP 010.9*   Basic Metabolic Panel: Recent Labs  Lab 09/02/20 0527 09/03/20 0835 09/04/20 0436 09/05/20 0426 09/06/20 0632  NA 131* 128* 131* 132* 133*  K 4.0 4.1 4.6 4.1 3.9  CL 97* 99 102 103 100  CO2 25 21* 22 23 26   GLUCOSE 101* 104* 131* 103* 76  BUN 31* 28* 33* 38* 31*  CREATININE 1.99* 2.02* 1.60* 1.49* 1.39*  CALCIUM 8.6* 8.0* 8.5* 8.5* 8.5*  MG 2.3  --  2.3 2.0 1.8  PHOS  --   --  3.7  --   --    Liver Function Tests: No results for input(s): AST, ALT, ALKPHOS, BILITOT, PROT, ALBUMIN in the last 168 hours. No results for input(s): LIPASE, AMYLASE in the last 168 hours. No results for input(s): AMMONIA in the last 168 hours. CBC: Recent Labs  Lab 08/31/20 0703 09/01/20 0519 09/02/20 0527 09/03/20 0835 09/04/20 0436 09/05/20 0426 09/06/20 0632  WBC 16.7*   < > 19.7* 19.8* 21.7* 19.6* 14.6*  NEUTROABS 13.6*  --  16.2* 17.2* 19.0* 15.6*  --   HGB 12.6   < > 10.7* 10.8* 9.8* 10.2* 10.6*  HCT 37.4   < > 33.1* 31.6* 29.3* 30.1* 31.4*  MCV 87.6   < > 89.5 86.8 87.7 86.7 87.5  PLT 264   < > 227 219 229 287 328   < > =  values in this interval not displayed.   Cardiac Enzymes: No results for input(s): CKTOTAL, CKMB, CKMBINDEX, TROPONINI in the last 168 hours. BNP: Invalid input(s): POCBNP CBG: No results for input(s): GLUCAP in the last 168  hours. D-Dimer No results for input(s): DDIMER in the last 72 hours. Hgb A1c No results for input(s): HGBA1C in the last 72 hours. Lipid Profile No results for input(s): CHOL, HDL, LDLCALC, TRIG, CHOLHDL, LDLDIRECT in the last 72 hours. Thyroid function studies No results for input(s): TSH, T4TOTAL, T3FREE, THYROIDAB in the last 72 hours.  Invalid input(s): FREET3 Anemia work up No results for input(s): VITAMINB12, FOLATE, FERRITIN, TIBC, IRON, RETICCTPCT in the last 72 hours. Urinalysis    Component Value Date/Time   COLORURINE YELLOW (A) 08/31/2020 1325   APPEARANCEUR CLEAR (A) 08/31/2020 1325   APPEARANCEUR Cloudy 06/10/2014 1035   LABSPEC 1.008 08/31/2020 1325   LABSPEC 1.010 06/10/2014 1035   PHURINE 8.0 08/31/2020 1325   GLUCOSEU NEGATIVE 08/31/2020 1325   GLUCOSEU Negative 06/10/2014 1035   HGBUR SMALL (A) 08/31/2020 1325   BILIRUBINUR NEGATIVE 08/31/2020 1325   BILIRUBINUR Negative 06/10/2014 1035   KETONESUR NEGATIVE 08/31/2020 1325   PROTEINUR NEGATIVE 08/31/2020 1325   NITRITE NEGATIVE 08/31/2020 1325   LEUKOCYTESUR SMALL (A) 08/31/2020 1325   LEUKOCYTESUR 3+ 06/10/2014 1035   Sepsis Labs Invalid input(s): PROCALCITONIN,  WBC,  LACTICIDVEN Microbiology Recent Results (from the past 240 hour(s))  Resp Panel by RT-PCR (Flu A&B, Covid) Nasopharyngeal Swab     Status: None   Collection Time: 08/31/20 11:27 AM   Specimen: Nasopharyngeal Swab; Nasopharyngeal(NP) swabs in vial transport medium  Result Value Ref Range Status   SARS Coronavirus 2 by RT PCR NEGATIVE NEGATIVE Final    Comment: (NOTE) SARS-CoV-2 target nucleic acids are NOT DETECTED.  The SARS-CoV-2 RNA is generally detectable in upper respiratory specimens during the acute phase of infection. The lowest concentration of SARS-CoV-2 viral copies this assay can detect is 138 copies/mL. A negative result does not preclude SARS-Cov-2 infection and should not be used as the sole basis for treatment  or other patient management decisions. A negative result may occur with  improper specimen collection/handling, submission of specimen other than nasopharyngeal swab, presence of viral mutation(s) within the areas targeted by this assay, and inadequate number of viral copies(<138 copies/mL). A negative result must be combined with clinical observations, patient history, and epidemiological information. The expected result is Negative.  Fact Sheet for Patients:  EntrepreneurPulse.com.au  Fact Sheet for Healthcare Providers:  IncredibleEmployment.be  This test is no t yet approved or cleared by the [place] FDA and  has been authorized for detection and/or diagnosis of SARS-CoV-2 by FDA under an Emergency Use Authorization (EUA). This EUA will remain  in effect (meaning this test can be used) for the duration of the COVID-19 declaration under Section 564(b)(1) of the Act, 21 U.S.C.section 360bbb-3(b)(1), unless the authorization is terminated  or revoked sooner.       Influenza A by PCR NEGATIVE NEGATIVE Final   Influenza B by PCR NEGATIVE NEGATIVE Final    Comment: (NOTE) The Xpert Xpress SARS-CoV-2/FLU/RSV plus assay is intended as an aid in the diagnosis of influenza from Nasopharyngeal swab specimens and should not be used as a sole basis for treatment. Nasal washings and aspirates are unacceptable for Xpert Xpress SARS-CoV-2/FLU/RSV testing.  Fact Sheet for Patients: EntrepreneurPulse.com.au  Fact Sheet for Healthcare Providers: IncredibleEmployment.be  This test is not yet approved or cleared by the [place] and  has been authorized for detection and/or diagnosis of SARS-CoV-2 by FDA under an Emergency Use Authorization (EUA). This EUA will remain in effect (meaning this test can be used) for the duration of the COVID-19 declaration under Section 564(b)(1) of the Act, 21 U.S.C. section  360bbb-3(b)(1), unless the authorization is terminated or revoked.  Performed at [facility], [address] [place], [place] [address]   MRSA PCR Screening     Status: None   Collection Time: 08/31/20  1:25 PM   Specimen: Nasopharyngeal  Result Value Ref Range Status   MRSA by PCR NEGATIVE NEGATIVE Final    Comment:        The GeneXpert MRSA Assay (FDA approved for NASAL specimens only), is one component of a comprehensive MRSA colonization surveillance program. It is not intended to diagnose MRSA infection nor to guide or monitor treatment for MRSA infections. Performed at [facility], [address] [place], [place] [address]   Urine Culture     Status: Abnormal   Collection Time: 09/01/20  3:15 PM   Specimen: Urine, Random  Result Value Ref Range Status   Specimen Description   Final    URINE, RANDOM Performed at [facility], [address] [place], [place] [address]    Special Requests   Final    NONE Performed at [facility], [address] [place], [place] [address]    Culture >=100,000 COLONIES/mL PROTEUS MIRABILIS (A)  Final   Report Status 09/04/2020 FINAL  Final   Organism ID, Bacteria PROTEUS MIRABILIS (A)  Final      Susceptibility   Proteus mirabilis - MIC*    AMPICILLIN <=2 SENSITIVE Sensitive     CEFAZOLIN <=4 SENSITIVE Sensitive     CEFEPIME <=0.12 SENSITIVE Sensitive     CEFTRIAXONE <=0.25 SENSITIVE Sensitive     CIPROFLOXACIN <=0.25 SENSITIVE Sensitive     GENTAMICIN <=1 SENSITIVE Sensitive     IMIPENEM 2 SENSITIVE Sensitive     NITROFURANTOIN 128 RESISTANT Resistant     TRIMETH/SULFA <=20 SENSITIVE Sensitive     AMPICILLIN/SULBACTAM <=2 SENSITIVE Sensitive     PIP/TAZO <=4 SENSITIVE Sensitive     * >=100,000 COLONIES/mL PROTEUS MIRABILIS  CULTURE, BLOOD (ROUTINE X 2) w Reflex to ID Panel     Status: None (Preliminary result)   Collection Time: 09/02/20  9:10 AM   Specimen: BLOOD  Result Value  Ref Range Status   Specimen Description BLOOD BLOOD LEFT ARM  Final   Special Requests   Final    BOTTLES DRAWN AEROBIC AND ANAEROBIC Blood Culture adequate volume   Culture   Final    NO GROWTH 4 DAYS Performed at [facility], [address], [place], [place] [address]    Report Status PENDING  Incomplete  CULTURE, BLOOD (ROUTINE X 2) w Reflex to ID Panel     Status: None (Preliminary result)   Collection Time: 09/02/20  9:10 AM   Specimen: BLOOD  Result Value Ref Range Status   Specimen Description BLOOD BLOOD LEFT FOREARM  Final   Special Requests   Final    BOTTLES DRAWN AEROBIC AND ANAEROBIC Blood Culture adequate volume   Culture   Final    NO GROWTH 4 DAYS Performed at [facility], [address] [place], [place] [address]    Report Status PENDING  Incomplete  Urine Culture     Status: Abnormal   Collection Time: 09/03/20 10:28 AM   Specimen: PATH Cytology Urine  Result Value Ref Range Status   Specimen Description   Final    URINE, RANDOM Performed at [facility], [address] [place], [place] [address]    Special Requests   Final    NONE Performed at [facility], [place], [place] [address]    Culture 60,000 COLONIES/mL PROTEUS MIRABILIS (A)  Final   Report Status 09/05/2020 FINAL  Final   Organism ID, Bacteria PROTEUS MIRABILIS (A)  Final      Susceptibility   Proteus mirabilis - MIC*    AMPICILLIN <=2 SENSITIVE Sensitive     CEFAZOLIN <=4 SENSITIVE Sensitive     CEFEPIME <=0.12 SENSITIVE Sensitive     CEFTRIAXONE <=0.25 SENSITIVE Sensitive     CIPROFLOXACIN <=0.25 SENSITIVE Sensitive     GENTAMICIN <=1 SENSITIVE Sensitive     IMIPENEM 2 SENSITIVE Sensitive     NITROFURANTOIN 128 RESISTANT Resistant     TRIMETH/SULFA <=20 SENSITIVE Sensitive     AMPICILLIN/SULBACTAM <=2 SENSITIVE Sensitive     PIP/TAZO <=4 SENSITIVE Sensitive     * 60,000 COLONIES/mL PROTEUS MIRABILIS  Expectorated Sputum  Assessment w Gram Stain, Rflx to Resp Cult     Status: None   Collection Time: 09/05/20  6:30 AM   Specimen: SPU  Result Value Ref Range Status   Specimen Description SPUTUM  Final   Special Requests NONE  Final   Sputum evaluation   Final    THIS SPECIMEN IS ACCEPTABLE FOR SPUTUM CULTURE Performed at [facility], [address] [place], [place] [address]    Report Status 09/06/2020 FINAL  Final  SARS CORONAVIRUS 2 (TAT 6-24 HRS) Nasopharyngeal Nasopharyngeal Swab     Status: None   Collection Time: 09/05/20  2:06 PM   Specimen: Nasopharyngeal Swab  Result Value Ref Range Status   SARS Coronavirus 2 NEGATIVE NEGATIVE Final    Comment: (NOTE) SARS-CoV-2 target nucleic acids are NOT DETECTED.  The SARS-CoV-2 RNA is generally detectable in upper and lower respiratory specimens during the acute phase of infection. Negative results do not preclude SARS-CoV-2 infection, do not rule out co-infections with other pathogens, and should not be used as the sole basis for treatment or other patient management decisions. Negative results must be combined with clinical observations, patient history, and epidemiological information. The expected result is Negative.  Fact Sheet for Patients: SugarRoll.be  Fact Sheet for Healthcare Providers: https://www.woods-mathews.com/  This test is not yet approved or cleared by the [place] FDA and  has been authorized for detection and/or diagnosis of SARS-CoV-2 by FDA under an Emergency Use Authorization (EUA). This EUA will remain  in effect (meaning this test can be used) for the duration of the COVID-19 declaration under Se ction 564(b)(1) of the Act, 21 U.S.C. section 360bbb-3(b)(1), unless the authorization is terminated or revoked sooner.  Performed at [facility], [place] [address] [place], [place] [address]      Total time spend on discharging this patient, including the last  patient exam, discussing the hospital stay, instructions for ongoing care as it relates to all pertinent caregivers, as well as preparing the medical discharge records, prescriptions, and/or referrals as applicable, is 35 minutes.    Enzo Bi, MD  [facility] 09/06/2020, 9:55 AM

## 2020-09-06 NOTE — TOC Progression Note (Signed)
Transition of Care [facility]) - Progression Note    Patient Details  Name: Jessica Long MRN: [id number] Date of Birth: 1946/04/25  Transition of Care [facility]) CM/SW [place], RN Phone Number: 09/06/2020, 1:50 PM  Clinical Narrative:   [facility] Transport and spoke to Bed, Arranged Door to Door transport to go to [employer], They will call the nurses desk upon arrival and will pick up the patient from the Medical mall    Expected Discharge Plan: [place] Barriers to Discharge: Continued Medical Work up  Expected Discharge Plan and Services Expected Discharge Plan: [place] arrangements for the past 2 months: [place] Expected Discharge Date: 09/06/20                                     Social Determinants of Health (SDOH) Interventions    Readmission Risk Interventions No flowsheet data found.

## 2020-09-06 NOTE — Progress Notes (Signed)
[place] for Warfarin Indication: atrial fibrillation  Patient Measurements: Height: 5\' 8"  (172.7 cm) Weight: 82.9 kg (182 lb 12.2 oz) IBW/kg (Calculated) : 63.9  Labs: Recent Labs    09/04/20 0436 09/05/20 0426 09/06/20 0632  HGB 9.8* 10.2* 10.6*  HCT 29.3* 30.1* 31.4*  PLT 229 287 328  LABPROT 25.5* 23.0* 22.4*  INR 2.3* 2.0* 2.0*  CREATININE 1.60* 1.49* 1.39*    Estimated Creatinine Clearance: 38.3 mL/min (A) (by C-G formula based on SCr of 1.39 mg/dL (H)).   Medical History: Past Medical History:  Diagnosis Date  . Allergic rhinitis   . Atrial fibrillation ([place])   . CHF (congestive heart failure) ([place])   . Depression   . Hyperlipemia   . Osteoarthritis   . Renal disorder     Assessment: 75 y.o.femalewith medical history significant forCVA, history of A. fib on chronic anticoagulation with Coumadin, chronic diastolic dysfunction CHF, dyslipidemia and chronic kidney disease who was sent to the ER for evaluation of shortness of breath. Warfarin was held on admission due to supratherapeutic INR on admission. Her home regimen of warfarin is 2 mg daily. Last patient reported dose was on 4/26 prior to admission.   Date INR Warfarin Dose 4/27 3.9 -- 4/28 4.8 -- 4/29 3.5 -- 4/30 3.0 -- 5/01 2.3 2 mg 5/02 2.0 1 mg 5/03 2.0  DDI: ciprofloxacin   Goal of Therapy:  INR 2 - 3 Monitor platelets by anticoagulation protocol: Yes   Plan:   INR therapeutic. Will continue with warfarin 1 mg given interaction with ciprofloxacin and expected increase in INR.  Daily INR to guide treatment plan  CBC at least every 3 days per protocol  Tawnya Crook, PharmD 09/06/2020,9:06 AM

## 2020-09-07 LAB — CULTURE, BLOOD (ROUTINE X 2)
Culture: NO GROWTH
Culture: NO GROWTH
Special Requests: ADEQUATE
Special Requests: ADEQUATE

## 2020-09-08 LAB — CULTURE, RESPIRATORY W GRAM STAIN: Culture: NORMAL

## 2020-09-09 ENCOUNTER — Other Ambulatory Visit: Payer: Self-pay | Admitting: Urology

## 2020-09-09 DIAGNOSIS — N200 Calculus of kidney: Secondary | ICD-10-CM

## 2020-09-27 ENCOUNTER — Other Ambulatory Visit: Payer: Self-pay

## 2020-09-27 ENCOUNTER — Other Ambulatory Visit
Admission: RE | Admit: 2020-09-27 | Discharge: 2020-09-27 | Disposition: A | Discharge status: Home / Self Care | Payer: Commercial Managed Care - HMO | Source: Ambulatory Visit | Attending: Urology | Admitting: Urology

## 2020-09-27 HISTORY — DX: Malignant (primary) neoplasm, unspecified: C80.1

## 2020-09-27 HISTORY — DX: Personal history of urinary calculi: Z87.442

## 2020-09-27 NOTE — Pre-Procedure Instructions (Addendum)
Resides at [facility]. Preop interview completed with the patient. Call to [facility] to request [facility], which the nurse is to fax to pre-admission testing. Surgery instructions faxed to [facility]. When speaking with the nurse at [facility], was also instructed to talk with Dr. Bernardo Heater at tomorrow's appt. about when to stop taking Coumadin. Usual time is 5 days prior to surgery; but needs to verify that with Dr. Bernardo Heater at tomorrow's (09/28/20) appt.

## 2020-09-27 NOTE — Patient Instructions (Addendum)
Your procedure is scheduled on:  Tuesday, May 31 Report to the Registration Desk on the 1st floor of the [facility]. To find out your arrival time, please call [phone] between 1PM - 3PM on: Friday, May 27  REMEMBER: Instructions that are not followed completely may result in serious medical risk, up to and including death; or upon the discretion of your surgeon and anesthesiologist your surgery may need to be rescheduled.  Do not eat or drink after midnight the night before surgery.  No gum chewing, lozengers or hard candies.  TAKE THESE MEDICATIONS THE MORNING OF SURGERY WITH A SIP OF WATER:  1.  Bupropion (Wellbutrin) 2.  Famotidine (Pepcid) - (take one the night before and one on the morning of surgery - helps to prevent nausea after surgery.) 3.  Metoprolol 4.  Tamsulosin (Flomax)  Follow recommendations from Cardiologist, Pulmonologist or PCP regarding stopping Coumadin. Appointment with Dr. Bernardo Heater scheduled for May 25. Please confirm stop date for coumadin with him at this appointment.  One week prior to surgery: starting May 24 Stop Anti-inflammatories (NSAIDS) such as Advil, Aleve, Ibuprofen, Motrin, Naproxen, Naprosyn and Aspirin based products such as Excedrin, Goodys Powder, BC Powder. Stop ANY OVER THE COUNTER supplements until after surgery. (multiple vitamin) You may continue to take tylenol up to and including the day of surgery.  No Alcohol for 24 hours before or after surgery.  On the morning of surgery brush your teeth with toothpaste and water, you may rinse your mouth with mouthwash if you wish. Do not swallow any toothpaste or mouthwash.  Do not wear jewelry, make-up, hairpins, clips or nail polish.  Do not wear lotions, powders, or perfumes.   Do not shave body from the neck down 48 hours prior to surgery just in case you cut yourself which could leave a site for infection.   Contact lenses, hearing aids and dentures may not be worn into  surgery.  Do not bring valuables to the hospital. [facility] is not responsible for any missing/lost belongings or valuables.   Notify your doctor if there is any change in your medical condition (cold, fever, infection).  Wear comfortable clothing (specific to your surgery type) to the hospital.  Plan for stool softeners for home use; pain medications have a tendency to cause constipation. You can also help prevent constipation by eating foods high in fiber such as fruits and vegetables and drinking plenty of fluids as your diet allows.  After surgery, you can help prevent lung complications by doing breathing exercises.  Take deep breaths and cough every 1-2 hours. Your doctor may order a device called an Incentive Spirometer to help you take deep breaths.  If you are being discharged the day of surgery, you will not be allowed to drive home. You will need a responsible adult (18 years or older) to drive you home and stay with you that night.   If you are taking public transportation, you will need to have a responsible adult (18 years or older) with you. Please confirm with your physician that it is acceptable to use public transportation.   Please call the [place] [facility]. at [phone] if you have any questions about these instructions.  Surgery Visitation Policy:  Patients undergoing a surgery or procedure may have one family member or support person with them as long as that person is not COVID-19 positive or experiencing its symptoms.  That person may remain in the waiting area during the procedure.

## 2020-09-28 ENCOUNTER — Ambulatory Visit (INDEPENDENT_AMBULATORY_CARE_PROVIDER_SITE_OTHER): Payer: Commercial Managed Care - HMO | Admitting: Urology

## 2020-09-28 ENCOUNTER — Encounter: Payer: Self-pay | Admitting: Urology

## 2020-09-28 VITALS — BP 107/70 | HR 86 | Ht 68.0 in | Wt 174.0 lb

## 2020-09-28 DIAGNOSIS — N132 Hydronephrosis with renal and ureteral calculous obstruction: Secondary | ICD-10-CM | POA: Diagnosis not present

## 2020-09-28 DIAGNOSIS — N200 Calculus of kidney: Secondary | ICD-10-CM

## 2020-09-29 ENCOUNTER — Telehealth: Payer: Self-pay

## 2020-09-29 ENCOUNTER — Encounter: Payer: Self-pay | Admitting: Urology

## 2020-09-29 LAB — URINALYSIS, COMPLETE
Bilirubin, UA: NEGATIVE
Glucose, UA: NEGATIVE
Ketones, UA: NEGATIVE
Nitrite, UA: NEGATIVE
Specific Gravity, UA: 1.02 (ref 1.005–1.030)
Urobilinogen, Ur: 0.2 mg/dL (ref 0.2–1.0)
pH, UA: 5 (ref 5.0–7.5)

## 2020-09-29 LAB — MICROSCOPIC EXAMINATION
RBC, Urine: >30 /hpf — AB (ref 0–2)
WBC, UA: >30 /hpf — AB (ref 0–5)

## 2020-09-29 NOTE — Telephone Encounter (Signed)
Incoming call from [place] at [facility] pre service center who states that this pt needs a prior auth prior to her upcoming procedure on Tuesday. The pre service center states that they will be closed on Monday for the holiday.

## 2020-09-29 NOTE — Progress Notes (Signed)
09/28/2020 6:46 AM   Jessica Long 01/18/1946 [id number]  Referring provider: [facility] [address] [place],  [place] [address]  Chief Complaint  Patient presents with  . Nephrolithiasis    HPI: Jessica Long is a 75 y.o. female who presents for preop visit.   Admitted [facility] 08/31/2020 with shortness of breath and AKI  On 4/29 was found to have worsening leukocytosis.  Renal ultrasound showed left hydronephrosis with a UPJ stone  CT was obtained which showed severe left hydronephrosis and an 18 mm left UPJ calculus  Urology consultation was obtained and she subsequently developed hypotension and underwent left ureteral stent placement by Dr. Lovena Neighbours on 09/03/2020.  Urine culture grew Proteus mirabilis  Discharged to rehab on 10/03/2020 and presents for discussion of definitive stone treatment  Currently feeling much better and back to baseline  Mild to moderate stent irritative symptoms   PMH: Past Medical History:  Diagnosis Date  . Allergic rhinitis   . Atrial fibrillation ([place])   . Cancer [facility])    right breast  . CHF (congestive heart failure) ([place])   . Depression   . History of kidney stones   . Hyperlipemia   . Osteoarthritis   . Renal disorder   . Stroke [facility]) 2011    Surgical History: Past Surgical History:  Procedure Laterality Date  . CYSTOSCOPY WITH URETEROSCOPY AND STENT PLACEMENT Left 09/03/2020   Procedure: CYSTOSCOPY  AND LEFT STENT PLACEMENT;  Surgeon: Ceasar Mons, MD;  Location: [facility] ORS;  Service: Urology;  Laterality: Left;  [facility] Kitchen MASTECTOMY Right 2007    Home Medications:  Allergies as of 09/28/2020      Reactions   Amoxicillin Swelling   Oral swelling   Ampicillin Swelling   Oral swelling      Medication List       Accurate as of Sep 28, 2020 11:59 PM. If you have any questions, ask your nurse or doctor.        acetaminophen 500 MG tablet Commonly known as: TYLENOL Take 2 tablets (1,000 mg  total) by mouth every 8 (eight) hours as needed for moderate pain or headache. What changed: when to take this   buPROPion 300 MG 24 hr tablet Commonly known as: WELLBUTRIN XL Take 300 mg by mouth daily.   cetirizine 10 MG tablet Commonly known as: ZYRTEC Take 10 mg by mouth daily.   famotidine 10 MG tablet Commonly known as: PEPCID Take 1 tablet (10 mg total) by mouth at bedtime.   fluticasone 50 MCG/ACT nasal spray Commonly known as: FLONASE Place 2 sprays into both nostrils daily.   metoprolol tartrate 25 MG tablet Commonly known as: LOPRESSOR Take 0.5 tablets (12.5 mg total) by mouth 2 (two) times daily.   Multivitamin Plus Iron Adult Tabs Take 1 tablet by mouth daily.   polyethylene glycol 17 g packet Commonly known as: MIRALAX / GLYCOLAX Take 34 g by mouth daily as needed. What changed: reasons to take this   potassium chloride SA 20 MEQ tablet Commonly known as: KLOR-CON Take 20 mEq by mouth daily.   selenium sulfide 1 % Lotn Commonly known as: SELSUN Apply 1 application topically 2 (two) times a week.   simvastatin 10 MG tablet Commonly known as: ZOCOR Take 10 mg by mouth at bedtime.   sodium chloride 0.65 % Soln nasal spray Commonly known as: OCEAN Place 2 sprays into both nostrils as needed for congestion.   tamsulosin 0.4 MG Caps capsule Commonly  known as: FLOMAX Take 1 capsule (0.4 mg total) by mouth daily.   torsemide 20 MG tablet Commonly known as: DEMADEX Take 20 mg by mouth daily.   vitamin B-12 250 MCG tablet Commonly known as: CYANOCOBALAMIN Take 250 mcg by mouth daily.   warfarin 2.5 MG tablet Commonly known as: COUMADIN Take 2.5 mg by mouth daily.   zolpidem 5 MG tablet Commonly known as: AMBIEN Take 0.5 tablets (2.5 mg total) by mouth at bedtime as needed for sleep. What changed: when to take this       Allergies:  Allergies  Allergen Reactions  . Amoxicillin Swelling    Oral swelling  . Ampicillin Swelling    Oral  swelling    Family History: Family History  Problem Relation Age of Onset  . Heart disease Mother   . Heart attack Father   . Parkinson's disease Sister   . Heart disease Sister   . Heart attack Brother   . COPD Sister     Social History:  reports that she quit smoking about 11 years ago. Her smoking use included cigarettes. She has never used smokeless tobacco. She reports that she does not drink alcohol and does not use drugs.   Physical Exam: BP 107/70   Pulse 86   Ht 5\' 8"  (1.727 m)   Wt 174 lb (78.9 kg)   BMI 26.46 kg/m   Constitutional:  Alert and oriented, No acute distress. HEENT: [place] AT, moist mucus membranes.  Trachea midline, no masses. Cardiovascular: No clubbing, cyanosis, or edema.  RRR Respiratory: Normal respiratory effort, clear. GI: Abdomen is soft, nontender, nondistended, no abdominal masses GU: No CVA tenderness Skin: No rashes, bruises or suspicious lesions. Neurologic: Grossly intact, no focal deficits, moving all 4 extremities. Psychiatric: Normal mood and affect.   Pertinent Imaging: CT images 09/02/2020 were personally reviewed and remarkable for a triangular-shaped 18 mm left UPJ calculus and a 14 mm left lower pole calculus   Assessment & Plan:    1. Nephrolithiasis  Large, obstructing left UPJ stone and nonobstructing left lower pole calculus We discussed various treatment options for urolithiasis including shockwave lithotripsy (SWL), ureteroscopy and laser lithotripsy with stent placement, and percutaneous nephrolithotomy. We discussed that management is based on stone size, location, density, patient co-morbidities, and patient preference.  SWL has a lower stone free rate in a single procedure, but also a lower complication rate compared to ureteroscopy and avoids a stent and associated stent related symptoms. Possible complications include renal hematoma, steinstrasse, and need for additional treatment. Ureteroscopy with laser lithotripsy  and stent placement has a higher stone free rate than SWL in a single procedure, however increased complication rate including possible infection, ureteral injury, bleeding, and stent related morbidity. Common stent related symptoms include dysuria, urgency/frequency, and flank pain. PCNL is the favored treatment for stones >2cm. It involves a small incision in the flank, with complete fragmentation of stones and removal. It has the highest stone free rate, but also the highest complication rate. Possible complications include bleeding, infection/sepsis, injury to surrounding organs including the pleura, and collecting system injury.  After an extensive discussion of the risks and benefits of the above treatment options, the patient would like to schedule ureteroscopic stone removal.  Based on stone burden we discussed the possibility of a staged procedure  The need for temporary postoperative stent placement was also discussed  She is in the process of being scheduled for medical clearance to hold Coumadin perioperatively   Abbie Sons,  MD  [facility] [address] [place], [place] [address] [phone]

## 2020-09-29 NOTE — H&P (View-Only) (Signed)
09/28/2020 6:46 AM   Jessica Long 1945/07/21 [id number]  Referring provider: [facility] [address] [place],  [place] [address]  Chief Complaint  Patient presents with  . Nephrolithiasis    HPI: Jessica Long is a 75 y.o. female who presents for preop visit.   Admitted [facility] 08/31/2020 with shortness of breath and AKI  On 4/29 was found to have worsening leukocytosis.  Renal ultrasound showed left hydronephrosis with a UPJ stone  CT was obtained which showed severe left hydronephrosis and an 18 mm left UPJ calculus  Urology consultation was obtained and she subsequently developed hypotension and underwent left ureteral stent placement by Dr. Lovena Neighbours on 09/03/2020.  Urine culture grew Proteus mirabilis  Discharged to rehab on 10/03/2020 and presents for discussion of definitive stone treatment  Currently feeling much better and back to baseline  Mild to moderate stent irritative symptoms   PMH: Past Medical History:  Diagnosis Date  . Allergic rhinitis   . Atrial fibrillation ([place])   . Cancer [facility])    right breast  . CHF (congestive heart failure) ([place])   . Depression   . History of kidney stones   . Hyperlipemia   . Osteoarthritis   . Renal disorder   . Stroke [facility]) 2011    Surgical History: Past Surgical History:  Procedure Laterality Date  . CYSTOSCOPY WITH URETEROSCOPY AND STENT PLACEMENT Left 09/03/2020   Procedure: CYSTOSCOPY  AND LEFT STENT PLACEMENT;  Surgeon: Ceasar Mons, MD;  Location: [facility] ORS;  Service: Urology;  Laterality: Left;  Marland Kitchen MASTECTOMY Right 2007    Home Medications:  Allergies as of 09/28/2020      Reactions   Amoxicillin Swelling   Oral swelling   Ampicillin Swelling   Oral swelling      Medication List       Accurate as of Sep 28, 2020 11:59 PM. If you have any questions, ask your nurse or doctor.        acetaminophen 500 MG tablet Commonly known as: TYLENOL Take 2 tablets (1,000 mg  total) by mouth every 8 (eight) hours as needed for moderate pain or headache. What changed: when to take this   buPROPion 300 MG 24 hr tablet Commonly known as: WELLBUTRIN XL Take 300 mg by mouth daily.   cetirizine 10 MG tablet Commonly known as: ZYRTEC Take 10 mg by mouth daily.   famotidine 10 MG tablet Commonly known as: PEPCID Take 1 tablet (10 mg total) by mouth at bedtime.   fluticasone 50 MCG/ACT nasal spray Commonly known as: FLONASE Place 2 sprays into both nostrils daily.   metoprolol tartrate 25 MG tablet Commonly known as: LOPRESSOR Take 0.5 tablets (12.5 mg total) by mouth 2 (two) times daily.   Multivitamin Plus Iron Adult Tabs Take 1 tablet by mouth daily.   polyethylene glycol 17 g packet Commonly known as: MIRALAX / GLYCOLAX Take 34 g by mouth daily as needed. What changed: reasons to take this   potassium chloride SA 20 MEQ tablet Commonly known as: KLOR-CON Take 20 mEq by mouth daily.   selenium sulfide 1 % Lotn Commonly known as: SELSUN Apply 1 application topically 2 (two) times a week.   simvastatin 10 MG tablet Commonly known as: ZOCOR Take 10 mg by mouth at bedtime.   sodium chloride 0.65 % Soln nasal spray Commonly known as: OCEAN Place 2 sprays into both nostrils as needed for congestion.   tamsulosin 0.4 MG Caps capsule Commonly  known as: FLOMAX Take 1 capsule (0.4 mg total) by mouth daily.   torsemide 20 MG tablet Commonly known as: DEMADEX Take 20 mg by mouth daily.   vitamin B-12 250 MCG tablet Commonly known as: CYANOCOBALAMIN Take 250 mcg by mouth daily.   warfarin 2.5 MG tablet Commonly known as: COUMADIN Take 2.5 mg by mouth daily.   zolpidem 5 MG tablet Commonly known as: AMBIEN Take 0.5 tablets (2.5 mg total) by mouth at bedtime as needed for sleep. What changed: when to take this       Allergies:  Allergies  Allergen Reactions  . Amoxicillin Swelling    Oral swelling  . Ampicillin Swelling    Oral  swelling    Family History: Family History  Problem Relation Age of Onset  . Heart disease Mother   . Heart attack Father   . Parkinson's disease Sister   . Heart disease Sister   . Heart attack Brother   . COPD Sister     Social History:  reports that she quit smoking about 11 years ago. Her smoking use included cigarettes. She has never used smokeless tobacco. She reports that she does not drink alcohol and does not use drugs.   Physical Exam: BP 107/70   Pulse 86   Ht 5\' 8"  (1.727 m)   Wt 174 lb (78.9 kg)   BMI 26.46 kg/m   Constitutional:  Alert and oriented, No acute distress. HEENT: [place] AT, moist mucus membranes.  Trachea midline, no masses. Cardiovascular: No clubbing, cyanosis, or edema.  RRR Respiratory: Normal respiratory effort, clear. GI: Abdomen is soft, nontender, nondistended, no abdominal masses GU: No CVA tenderness Skin: No rashes, bruises or suspicious lesions. Neurologic: Grossly intact, no focal deficits, moving all 4 extremities. Psychiatric: Normal mood and affect.   Pertinent Imaging: CT images 09/02/2020 were personally reviewed and remarkable for a triangular-shaped 18 mm left UPJ calculus and a 14 mm left lower pole calculus   Assessment & Plan:    1. Nephrolithiasis  Large, obstructing left UPJ stone and nonobstructing left lower pole calculus We discussed various treatment options for urolithiasis including shockwave lithotripsy (SWL), ureteroscopy and laser lithotripsy with stent placement, and percutaneous nephrolithotomy. We discussed that management is based on stone size, location, density, patient co-morbidities, and patient preference.  SWL has a lower stone free rate in a single procedure, but also a lower complication rate compared to ureteroscopy and avoids a stent and associated stent related symptoms. Possible complications include renal hematoma, steinstrasse, and need for additional treatment. Ureteroscopy with laser lithotripsy  and stent placement has a higher stone free rate than SWL in a single procedure, however increased complication rate including possible infection, ureteral injury, bleeding, and stent related morbidity. Common stent related symptoms include dysuria, urgency/frequency, and flank pain. PCNL is the favored treatment for stones >2cm. It involves a small incision in the flank, with complete fragmentation of stones and removal. It has the highest stone free rate, but also the highest complication rate. Possible complications include bleeding, infection/sepsis, injury to surrounding organs including the pleura, and collecting system injury.  After an extensive discussion of the risks and benefits of the above treatment options, the patient would like to schedule ureteroscopic stone removal.  Based on stone burden we discussed the possibility of a staged procedure  The need for temporary postoperative stent placement was also discussed  She is in the process of being scheduled for medical clearance to hold Coumadin perioperatively   Abbie Sons,  MD  [facility] [address] [place], [place] [address] [phone]

## 2020-10-01 LAB — CULTURE, URINE COMPREHENSIVE

## 2020-10-03 ENCOUNTER — Other Ambulatory Visit: Payer: Self-pay | Admitting: Urology

## 2020-10-03 NOTE — H&P (View-Only) (Signed)
error 

## 2020-10-03 NOTE — Progress Notes (Signed)
error 

## 2020-10-07 ENCOUNTER — Other Ambulatory Visit: Payer: Self-pay | Admitting: *Deleted

## 2020-10-07 MED ORDER — NITROFURANTOIN MONOHYD MACRO 100 MG PO CAPS: 100.0000 mg | ORAL_CAPSULE | Freq: Two times a day (BID) | ORAL | 0 refills | Status: AC

## 2020-10-07 NOTE — Telephone Encounter (Signed)
Sent medication into [facility] . Talkjed with them at home to noticed patient to take medication .

## 2020-10-21 ENCOUNTER — Encounter: Payer: Self-pay | Admitting: Urology

## 2020-10-21 NOTE — Progress Notes (Signed)
Perioperative Services  Pre-Admission/Anesthesia Testing Clinical Review  Date: 10/21/20  Patient Demographics:  Name: Jessica Long DOB:   [birth year] MRN:   [id number]  Planned Surgical Procedure(s):    Case: [id number] Date/Time: 10/25/20 0842   Procedure: CYSTOSCOPY/URETEROSCOPY/HOLMIUM LASER/STENT EXCHANGE (Left)   Anesthesia type: Choice   Pre-op diagnosis: left nephrolithiasis   Location: [facility] OR ROOM 10 / [place] [facility]   Surgeons: Abbie Sons, MD     NOTE: Available PAT nursing documentation and vital signs have been reviewed. Clinical nursing staff has updated patient's PMH/PSHx, current medication list, and drug allergies/intolerances to ensure comprehensive history available to assist in medical decision making as it pertains to the aforementioned surgical procedure and anticipated anesthetic course. Extensive review of available clinical information performed. [place] PMH and PSHx updated with any diagnoses/procedures that  may have been inadvertently omitted during her intake with the pre-admission testing department's nursing staff.  Clinical Discussion:  Jessica Long is a 75 y.o. female who is submitted for pre-surgical anesthesia review and clearance prior to her undergoing the above procedure. Patient is a Former Smoker (quit 05/2009). Pertinent PMH includes: CHF, atrial fibrillation, CVA,, aortic atherosclerosis HLD, OA, nephrolithiasis, depression.  Patient originally scheduled for a urological procedure on 10/04/2020 with Dr. John Giovanni.  This procedure was following hospital admission.  Patient was discharged back to a SNF.  Patient on chronic anticoagulation (warfarin) for a diagnosis of paroxysmal atrial fibrillation.  Medical director of the SNF did not feel comfortable stopping medication, as patient has no local cardiologist.  Through collaborative efforts, PAT and urology set patient up with local cardiologist to establish care and for  ongoing management of her chronic cardiac condition.  Patient was seen in consult by cardiology Clayborn Bigness, MD) on 10/12/2020; notes reviewed.  At the time of her clinic visit, patient denied any chest pain, shortness breath, PND, orthopnea, palpitations, significant peripheral edema, vertiginous symptoms, or presyncope/syncope.  Patient with a PMH significant for CVA back in 2011.  Patient with generalized reduced strength and memory issues following her stroke, however otherwise "feels okay".  As previously mentioned, patient with an atrial fibrillation diagnosis. CHA2DS2-VASc Score = 7 (age x 2, sex, CHF, previous CVA x 2, aortic plaque).  As previously mentioned, patient chronically anticoagulated using daily warfarin; compliant with therapy with no evidence of GI bleeding.  Last TTE performed on 08/31/2020 revealed a normal left ventricular systolic function with an EF of 60 to 65% (see full interpretation of cardiovascular testing below).  Blood pressure well controlled at 124/78.  Patient is on a statin for her HLD. Functional capacity, as defined by DASI, is documented as being >/= 4 METS.  Patient was started on torsemide, metoprolol, and losartan for her CHF diagnosis.  Patient to follow-up with outpatient cardiology in 1 month or sooner if needed.  Per cardiology, "this patient is optimized for surgery and may proceed with the planned procedural course with a MILD TO MODERATE risk stratification".  Again, this patient is on daily anticoagulation therapy.  She has been cleared by cardiology to hold her warfarin for 5 days prior to her procedure with plans to restart as soon as postoperative bleeding risk felt to be minimized by primary attending surgeon.  She will not require a preoperative enoxaparin bridge prior to her procedure. The patient and SNF staff aware that patient should take her last dose of warfarin on 10/20/2020.  The patient will continue her daily low-dose ASA throughout the  perioperative period.  Patient denies previous perioperative complications with anesthesia in the past.  There are no records available for review in [facility] regarding patient's past surgical/anesthetic courses.  Vitals with BMI 09/28/2020 09/27/2020 09/06/2020  Height 5\' 8"  5\' 8"  -  Weight 174 lbs 174 lbs -  BMI 40.98 11.91 -  Systolic 478 - 295  Diastolic 70 - 59  Pulse 86 - 67    Providers/Specialists:   NOTE: Primary physician provider listed below. Patient may have been seen by APP or partner within same practice.   PROVIDER ROLE / SPECIALTY LAST Claud Kelp, MD  Urology (Surgeon) 10/03/2020  [facility]  Primary Care Provider 10/04/2020  Katrine Coho, MD  Cardiology 10/12/2020   Allergies:  Amoxicillin and Ampicillin  Current Home Medications:   No current facility-administered medications for this encounter.    acetaminophen (TYLENOL) 500 MG tablet   buPROPion (WELLBUTRIN XL) 300 MG 24 hr tablet   cetirizine (ZYRTEC) 10 MG tablet   famotidine (PEPCID) 10 MG tablet   metoprolol tartrate (LOPRESSOR) 25 MG tablet   Multiple Vitamins-Iron (MULTIVITAMIN PLUS IRON ADULT) TABS   polyethylene glycol (MIRALAX / GLYCOLAX) 17 g packet   potassium chloride SA (KLOR-CON) 20 MEQ tablet   selenium sulfide (SELSUN) 1 % LOTN   simvastatin (ZOCOR) 10 MG tablet   sodium chloride (OCEAN) 0.65 % SOLN nasal spray   tamsulosin (FLOMAX) 0.4 MG CAPS capsule   torsemide (DEMADEX) 20 MG tablet   vitamin B-12 (CYANOCOBALAMIN) 250 MCG tablet   zolpidem (AMBIEN) 5 MG tablet   fluticasone (FLONASE) 50 MCG/ACT nasal spray   warfarin (COUMADIN) 2.5 MG tablet   History:   Past Medical History:  Diagnosis Date   Allergic rhinitis    Aortic atherosclerosis (HCC)    Atrial fibrillation (HCC)    Breast cancer, right (HCC)    CHF (congestive heart failure) (HCC)    CKD (chronic kidney disease)    Current use of long term anticoagulation    Warfarin   Depression     History of kidney stones    Hyperlipemia    Osteoarthritis    Stroke ([place]) 2011   Past Surgical History:  Procedure Laterality Date   CYSTOSCOPY WITH URETEROSCOPY AND STENT PLACEMENT Left 09/03/2020   Procedure: CYSTOSCOPY  AND LEFT STENT PLACEMENT;  Surgeon: Ceasar Mons, MD;  Location: [facility] ORS;  Service: Urology;  Laterality: Left;   MASTECTOMY Right 2007   Family History  Problem Relation Age of Onset   Heart disease Mother    Heart attack Father    Parkinson's disease Sister    Heart disease Sister    Heart attack Brother    COPD Sister    Social History   Tobacco Use   Smoking status: Former    Pack years: 0.00    Types: Cigarettes    Quit date: 2011    Years since quitting: 11.4   Smokeless tobacco: Never  Vaping Use   Vaping Use: Never used  Substance Use Topics   Alcohol use: Never   Drug use: Never    Pertinent Clinical Results:  LABS: Labs reviewed: Acceptable for surgery.  Lab Results  Component Value Date   WBC 14.6 (H) 09/06/2020   HGB 10.6 (L) 09/06/2020   HCT 31.4 (L) 09/06/2020   MCV 87.5 09/06/2020   PLT 328 09/06/2020   Lab Results  Component Value Date   NA 133 (L) 09/06/2020   K 3.9 09/06/2020   CO2 26 09/06/2020  GLUCOSE 76 09/06/2020   BUN 31 (H) 09/06/2020   CREATININE 1.39 (H) 09/06/2020   CALCIUM 8.5 (L) 09/06/2020   GFRNONAA 39 (L) 09/06/2020   GFRAA 44 (L) 06/10/2014    No visits with results within 3 Day(s) from this visit.  Latest known visit with results is:  Office Visit on 09/28/2020  Component Date Value Ref Range Status   Specific Gravity, UA 09/28/2020 1.020  1.005 - 1.030 Final   pH, UA 09/28/2020 5.0  5.0 - 7.5 Final   Color, UA 09/28/2020 Yellow  Yellow Final   Appearance Ur 09/28/2020 Cloudy (A) Clear Final   Leukocytes,UA 09/28/2020 1+ (A) Negative Final   Protein,UA 09/28/2020 2+ (A) Negative/Trace Final   Glucose, UA 09/28/2020 Negative  Negative Final   Ketones, UA 09/28/2020 Negative   Negative Final   RBC, UA 09/28/2020 3+ (A) Negative Final   Bilirubin, UA 09/28/2020 Negative  Negative Final   Urobilinogen, Ur 09/28/2020 0.2  0.2 - 1.0 mg/dL Final   Nitrite, UA 09/28/2020 Negative  Negative Final   Microscopic Examination 09/28/2020 See below:   Final   Urine Culture, Comprehensive 09/28/2020 Final report (A)  Final   Organism ID, Bacteria 09/28/2020 Comment (A)  Final   Comment: Staphylococcus epidermidis Based on resistance to oxacillin this isolate would be resistant to all currently available beta-lactam antimicrobial agents, with the exception of the newer cephalosporins with anti-MRSA activity, such as Ceftaroline Greater than 100,000 colony forming units per mL    ANTIMICROBIAL SUSCEPTIBILITY 09/28/2020 Comment   Final   Comment:       ** S = Susceptible; I = Intermediate; R = Resistant **                    P = Positive; N = Negative             MICS are expressed in micrograms per mL    Antibiotic                 RSLT#1    RSLT#2    RSLT#3    RSLT#4 Ciprofloxacin                  R Gentamicin                     S Levofloxacin                   R Linezolid                      S Moxifloxacin                   R Nitrofurantoin                 S Oxacillin                      R Penicillin                     R Quinupristin/Dalfopristin      S Rifampin                       R Tetracycline                   S Trimethoprim/Sulfa             R Vancomycin  S    WBC, UA 09/28/2020 >30 (A) 0 - 5 /hpf Final   RBC 09/28/2020 >30 (A) 0 - 2 /hpf Final   Epithelial Cells (non renal) 09/28/2020 0-10  0 - 10 /hpf Final   Renal Epithel, UA 09/28/2020 0-10 (A) None seen /hpf Final   Crystals 09/28/2020 Present (A) N/A Final   Crystal Type 09/28/2020 Amorphous Sediment  N/A Final   Bacteria, UA 09/28/2020 Moderate (A) None seen/Few Final    ECG: Date: 08/31/2020 Time ECG obtained: 0710 AM Rate: 89 bpm Rhythm: atrial fibrillation Intervals:  QRS 133 ms. QTc 480 ms. ST segment and T wave changes: No evidence of acute ST segment elevation or depression Comparison: Similar to previous tracing obtained on 06/10/2014.   IMAGING / PROCEDURES: DIAGNOSTIC RADIOGRAPHS CHEST PORTABLE 1 VIEW performed on 09/04/2020 Increasing pulmonary vascular congestion with interstitial changes suggestive of developing pulmonary edema on a background of chronic interstitial change and atelectasis. Small left pleural effusion Aortic atherosclerosis  CT RENAL STONE STUDY performed on 09/02/2020 High-grade left-sided obstructive uropathy due to an 18 mm obstructing left UPJ calculus Nonobstructing 14 mm left renal calculus Bilateral renal cortical atrophy with prominent areas of cortical scarring in the right kidney Patchy bibasilar atelectasis with trace left pleural effusion Aortic atherosclerosis  TRANSTHORACIC ECHOCARDIOGRAM performed on 08/31/2020 LVEF 60-65% Normal left ventricular systolic function No left ventricular regional wall motion abnormalities Left ventricular diastolic function cannot be evaluated Right ventricular systolic function normal Right ventricular size normal Mitral valve not well visualized; trivial regurgitation Aortic valve not well visualized; no regurgitation noted  Impression and Plan:  MAIE KESINGER has been referred for pre-anesthesia review and clearance prior to her undergoing the planned anesthetic and procedural courses. Available labs, pertinent testing, and imaging results were personally reviewed by me. This patient has been appropriately cleared by cardiology with an overall MILD to MODERATE risk of significant perioperative cardiovascular complications.  Based on clinical review performed today (10/21/20), barring any significant acute changes in the patient's overall condition, it is anticipated that she will be able to proceed with the planned surgical intervention. Any acute changes in clinical condition  may necessitate her procedure being postponed and/or cancelled. Patient will meet with anesthesia team (MD and/or CRNA) on the day of her procedure for preoperative evaluation/assessment. Questions regarding anesthetic course will be fielded at that time.   Pre-surgical instructions were reviewed with the patient during her PAT appointment and questions were fielded by PAT clinical staff. Patient was advised that if any questions or concerns arise prior to her procedure then she should return a call to PAT and/or her surgeon's office to discuss.  Honor Loh, MSN, APRN, FNP-C, CEN [facility]  Peri-operative Services Nurse Practitioner Phone: [phone] Fax: [phone] 10/21/20 2:20 PM  NOTE: This note has been prepared using Dragon dictation software. Despite my best ability to proofread, there is always the potential that unintentional transcriptional errors may still occur from this process.

## 2020-10-25 ENCOUNTER — Ambulatory Visit: Payer: Medicare Other | Admitting: Urgent Care

## 2020-10-25 ENCOUNTER — Ambulatory Visit: Payer: Medicare Other

## 2020-10-25 ENCOUNTER — Ambulatory Visit
Admission: RE | Admit: 2020-10-25 | Discharge: 2020-10-25 | Disposition: A | Discharge status: Home / Self Care | Payer: Medicare Other | Attending: Urology | Admitting: Urology

## 2020-10-25 ENCOUNTER — Encounter
Admission: RE | Disposition: A | Discharge status: Home / Self Care | Payer: Self-pay | Source: Home / Self Care | Attending: Urology

## 2020-10-25 ENCOUNTER — Encounter: Payer: Self-pay | Admitting: Urology

## 2020-10-25 DIAGNOSIS — I509 Heart failure, unspecified: Secondary | ICD-10-CM | POA: Insufficient documentation

## 2020-10-25 DIAGNOSIS — F32A Depression, unspecified: Secondary | ICD-10-CM | POA: Insufficient documentation

## 2020-10-25 DIAGNOSIS — E785 Hyperlipidemia, unspecified: Secondary | ICD-10-CM | POA: Insufficient documentation

## 2020-10-25 DIAGNOSIS — Z87891 Personal history of nicotine dependence: Secondary | ICD-10-CM | POA: Insufficient documentation

## 2020-10-25 DIAGNOSIS — Z880 Allergy status to penicillin: Secondary | ICD-10-CM | POA: Diagnosis not present

## 2020-10-25 DIAGNOSIS — Z79899 Other long term (current) drug therapy: Secondary | ICD-10-CM | POA: Insufficient documentation

## 2020-10-25 DIAGNOSIS — M1990 Unspecified osteoarthritis, unspecified site: Secondary | ICD-10-CM | POA: Insufficient documentation

## 2020-10-25 DIAGNOSIS — N200 Calculus of kidney: Secondary | ICD-10-CM | POA: Diagnosis not present

## 2020-10-25 DIAGNOSIS — I480 Paroxysmal atrial fibrillation: Secondary | ICD-10-CM | POA: Diagnosis not present

## 2020-10-25 DIAGNOSIS — I700 Atherosclerosis of aorta: Secondary | ICD-10-CM | POA: Diagnosis not present

## 2020-10-25 DIAGNOSIS — Z7901 Long term (current) use of anticoagulants: Secondary | ICD-10-CM | POA: Insufficient documentation

## 2020-10-25 DIAGNOSIS — Z8673 Personal history of transient ischemic attack (TIA), and cerebral infarction without residual deficits: Secondary | ICD-10-CM | POA: Diagnosis not present

## 2020-10-25 HISTORY — DX: Malignant neoplasm of unspecified site of right female breast: C50.911

## 2020-10-25 HISTORY — DX: Chronic kidney disease, unspecified: N18.9

## 2020-10-25 HISTORY — DX: Long term (current) use of anticoagulants: Z79.01

## 2020-10-25 HISTORY — DX: Atherosclerosis of aorta: I70.0

## 2020-10-25 HISTORY — PX: CYSTOSCOPY/URETEROSCOPY/HOLMIUM LASER/STENT PLACEMENT: SHX6546

## 2020-10-25 LAB — COMPREHENSIVE METABOLIC PANEL
ALT: 16 U/L (ref 0–44)
AST: 13 U/L — ABNORMAL LOW (ref 15–41)
Albumin: 3.2 g/dL — ABNORMAL LOW (ref 3.5–5.0)
Alkaline Phosphatase: 42 U/L (ref 38–126)
Anion gap: 8 (ref 5–15)
BUN: 24 mg/dL — ABNORMAL HIGH (ref 8–23)
CO2: 27 mmol/L (ref 22–32)
Calcium: 8.8 mg/dL — ABNORMAL LOW (ref 8.9–10.3)
Chloride: 98 mmol/L (ref 98–111)
Creatinine, Ser: 1.16 mg/dL — ABNORMAL HIGH (ref 0.44–1.00)
GFR, Estimated: 49 mL/min — ABNORMAL LOW (ref >60)
Glucose, Bld: 140 mg/dL — ABNORMAL HIGH (ref 70–99)
Potassium: 4.6 mmol/L (ref 3.5–5.1)
Sodium: 133 mmol/L — ABNORMAL LOW (ref 135–145)
Total Bilirubin: 1 mg/dL (ref 0.3–1.2)
Total Protein: 6.2 g/dL — ABNORMAL LOW (ref 6.5–8.1)

## 2020-10-25 LAB — CBC WITH DIFFERENTIAL/PLATELET
Abs Immature Granulocytes: 0.1 10*3/uL — ABNORMAL HIGH (ref 0.00–0.07)
Basophils Absolute: 0 10*3/uL (ref 0.0–0.1)
Basophils Relative: 0 %
Eosinophils Absolute: 0 10*3/uL (ref 0.0–0.5)
Eosinophils Relative: 0 %
HCT: 37.8 % (ref 36.0–46.0)
Hemoglobin: 12.9 g/dL (ref 12.0–15.0)
Immature Granulocytes: 1 %
Lymphocytes Relative: 16 %
Lymphs Abs: 1.5 10*3/uL (ref 0.7–4.0)
MCH: 30.4 pg (ref 26.0–34.0)
MCHC: 34.1 g/dL (ref 30.0–36.0)
MCV: 89.2 fL (ref 80.0–100.0)
Monocytes Absolute: 0.3 10*3/uL (ref 0.1–1.0)
Monocytes Relative: 3 %
Neutro Abs: 7.3 10*3/uL (ref 1.7–7.7)
Neutrophils Relative %: 80 %
Platelets: 241 10*3/uL (ref 150–400)
RBC: 4.24 MIL/uL (ref 3.87–5.11)
RDW: 17.3 % — ABNORMAL HIGH (ref 11.5–15.5)
WBC: 9.2 10*3/uL (ref 4.0–10.5)
nRBC: 0 % (ref 0.0–0.2)

## 2020-10-25 LAB — TROPONIN I (HIGH SENSITIVITY)
Troponin I (High Sensitivity): 8 ng/L (ref <18)
Troponin I (High Sensitivity): 16 ng/L (ref <18)

## 2020-10-25 LAB — PROTIME-INR
INR: 1 (ref 0.8–1.2)
Prothrombin Time: 12.8 seconds (ref 11.4–15.2)

## 2020-10-25 SURGERY — CYSTOSCOPY/URETEROSCOPY/HOLMIUM LASER/STENT PLACEMENT
Anesthesia: General | Laterality: Left

## 2020-10-25 MED ORDER — ORAL CARE MOUTH RINSE: 15.0000 mL | Freq: Once | OROMUCOSAL | Status: AC

## 2020-10-25 MED ORDER — PHENYLEPHRINE HCL (PRESSORS) 10 MG/ML IV SOLN
INTRAVENOUS | Status: DC | PRN
  Administered 2020-10-25 (×2): 100 ug via INTRAVENOUS

## 2020-10-25 MED ORDER — EPHEDRINE SULFATE 50 MG/ML IJ SOLN
INTRAMUSCULAR | Status: DC | PRN
  Administered 2020-10-25 (×4): 10 mg via INTRAVENOUS
  Administered 2020-10-25: 5 mg via INTRAVENOUS

## 2020-10-25 MED ORDER — ROCURONIUM BROMIDE 100 MG/10ML IV SOLN
INTRAVENOUS | Status: DC | PRN
  Administered 2020-10-25: 10 mg via INTRAVENOUS
  Administered 2020-10-25: 50 mg via INTRAVENOUS

## 2020-10-25 MED ORDER — CHLORHEXIDINE GLUCONATE 0.12 % MT SOLN
OROMUCOSAL | Status: AC
  Filled 2020-10-25: qty 15

## 2020-10-25 MED ORDER — FENTANYL CITRATE (PF) 100 MCG/2ML IJ SOLN
INTRAMUSCULAR | Status: DC | PRN
  Administered 2020-10-25 (×2): 50 ug via INTRAVENOUS

## 2020-10-25 MED ORDER — ACETAMINOPHEN 10 MG/ML IV SOLN
INTRAVENOUS | Status: DC | PRN
  Administered 2020-10-25: 1000 mg via INTRAVENOUS

## 2020-10-25 MED ORDER — DOXYCYCLINE MONOHYDRATE 100 MG PO TABS: 100.0000 mg | ORAL_TABLET | Freq: Two times a day (BID) | ORAL | 0 refills | Status: AC

## 2020-10-25 MED ORDER — ACETAMINOPHEN 10 MG/ML IV SOLN
INTRAVENOUS | Status: AC
  Filled 2020-10-25: qty 100

## 2020-10-25 MED ORDER — OXYCODONE HCL 5 MG PO TABS
5.0000 mg | ORAL_TABLET | Freq: Once | ORAL | Status: AC | PRN
  Administered 2020-10-25: 5 mg via ORAL

## 2020-10-25 MED ORDER — FENTANYL CITRATE (PF) 100 MCG/2ML IJ SOLN
INTRAMUSCULAR | Status: AC
  Administered 2020-10-25: 25 ug via INTRAVENOUS
  Filled 2020-10-25: qty 2

## 2020-10-25 MED ORDER — MEPERIDINE HCL 25 MG/ML IJ SOLN: 6.2500 mg | INTRAMUSCULAR | Status: DC | PRN

## 2020-10-25 MED ORDER — DEXAMETHASONE SODIUM PHOSPHATE 10 MG/ML IJ SOLN
INTRAMUSCULAR | Status: DC | PRN
  Administered 2020-10-25: 10 mg via INTRAVENOUS

## 2020-10-25 MED ORDER — PROPOFOL 10 MG/ML IV BOLUS
INTRAVENOUS | Status: DC | PRN
  Administered 2020-10-25: 120 mg via INTRAVENOUS
  Administered 2020-10-25: 50 mg via INTRAVENOUS

## 2020-10-25 MED ORDER — FENTANYL CITRATE (PF) 100 MCG/2ML IJ SOLN
25.0000 ug | INTRAMUSCULAR | Status: DC | PRN
  Administered 2020-10-25: 50 ug via INTRAVENOUS
  Administered 2020-10-25 (×3): 25 ug via INTRAVENOUS

## 2020-10-25 MED ORDER — ONDANSETRON HCL 4 MG/2ML IJ SOLN
INTRAMUSCULAR | Status: DC | PRN
  Administered 2020-10-25: 4 mg via INTRAVENOUS

## 2020-10-25 MED ORDER — FENTANYL CITRATE (PF) 100 MCG/2ML IJ SOLN
INTRAMUSCULAR | Status: AC
  Filled 2020-10-25: qty 2

## 2020-10-25 MED ORDER — PROMETHAZINE HCL 25 MG/ML IJ SOLN: 6.2500 mg | INTRAMUSCULAR | Status: DC | PRN

## 2020-10-25 MED ORDER — GENTAMICIN SULFATE 40 MG/ML IJ SOLN
5.0000 mg/kg | INTRAVENOUS | Status: AC
  Administered 2020-10-25: 390 mg via INTRAVENOUS
  Filled 2020-10-25: qty 9.75

## 2020-10-25 MED ORDER — IOPAMIDOL (ISOVUE-200) INJECTION 41%
INTRAVENOUS | Status: DC | PRN
  Administered 2020-10-25: 5 mL via INTRAVENOUS

## 2020-10-25 MED ORDER — LACTATED RINGERS IV SOLN: INTRAVENOUS | Status: DC

## 2020-10-25 MED ORDER — LIDOCAINE HCL (CARDIAC) PF 100 MG/5ML IV SOSY
PREFILLED_SYRINGE | INTRAVENOUS | Status: DC | PRN
  Administered 2020-10-25: 80 mg via INTRAVENOUS

## 2020-10-25 MED ORDER — OXYCODONE HCL 5 MG PO TABS
ORAL_TABLET | ORAL | Status: AC
  Filled 2020-10-25: qty 1

## 2020-10-25 MED ORDER — GLYCOPYRROLATE 0.2 MG/ML IJ SOLN
INTRAMUSCULAR | Status: DC | PRN
  Administered 2020-10-25: .2 mg via INTRAVENOUS

## 2020-10-25 MED ORDER — OXYCODONE HCL 5 MG/5ML PO SOLN: 5.0000 mg | Freq: Once | ORAL | Status: AC | PRN

## 2020-10-25 MED ORDER — CHLORHEXIDINE GLUCONATE 0.12 % MT SOLN
15.0000 mL | Freq: Once | OROMUCOSAL | Status: AC
  Administered 2020-10-25: 15 mL via OROMUCOSAL

## 2020-10-25 SURGICAL SUPPLY — 28 items
BAG DRAIN CYSTO-URO LG1000N (MISCELLANEOUS) ×2 IMPLANT
BASKET LASER NITINOL 1.9FR (BASKET) ×2 IMPLANT
BASKET ZERO TIP 1.9FR (BASKET) ×2 IMPLANT
BRUSH SCRUB EZ 1% IODOPHOR (MISCELLANEOUS) ×2 IMPLANT
CATH URET FLEX-TIP 2 LUMEN 10F (CATHETERS) ×2 IMPLANT
CATH URETL 5X70 OPEN END (CATHETERS) IMPLANT
CNTNR SPEC 2.5X3XGRAD LEK (MISCELLANEOUS) ×1
CONT SPEC 4OZ STER OR WHT (MISCELLANEOUS) ×1
CONTAINER SPEC 2.5X3XGRAD LEK (MISCELLANEOUS) ×1 IMPLANT
DRAPE UTILITY 15X26 TOWEL STRL (DRAPES) ×2 IMPLANT
GLOVE SURG UNDER POLY LF SZ7.5 (GLOVE) ×2 IMPLANT
GOWN STRL REUS W/ TWL LRG LVL3 (GOWN DISPOSABLE) ×1 IMPLANT
GOWN STRL REUS W/ TWL XL LVL3 (GOWN DISPOSABLE) ×1 IMPLANT
GOWN STRL REUS W/TWL LRG LVL3 (GOWN DISPOSABLE) ×1
GOWN STRL REUS W/TWL XL LVL3 (GOWN DISPOSABLE) ×1
GUIDEWIRE STR DUAL SENSOR (WIRE) ×2 IMPLANT
INFUSOR MANOMETER BAG 3000ML (MISCELLANEOUS) ×2 IMPLANT
IV NS IRRIG 3000ML ARTHROMATIC (IV SOLUTION) ×2 IMPLANT
KIT TURNOVER CYSTO (KITS) ×2 IMPLANT
PACK CYSTO AR (MISCELLANEOUS) ×2 IMPLANT
SET CYSTO W/LG BORE CLAMP LF (SET/KITS/TRAYS/PACK) ×2 IMPLANT
SHEATH URETERAL 12FRX35CM (MISCELLANEOUS) ×2 IMPLANT
STENT URET 6FRX24 CONTOUR (STENTS) ×2 IMPLANT
STENT URET 6FRX26 CONTOUR (STENTS) IMPLANT
SURGILUBE 2OZ TUBE FLIPTOP (MISCELLANEOUS) ×2 IMPLANT
TRACTIP FLEXIVA PULSE ID 200 (Laser) ×2 IMPLANT
VALVE UROSEAL ADJ ENDO (VALVE) ×2 IMPLANT
WATER STERILE IRR 1000ML POUR (IV SOLUTION) ×2 IMPLANT

## 2020-10-25 NOTE — Discharge Instructions (Addendum)
DISCHARGE INSTRUCTIONS FOR KIDNEY STONE/URETERAL STENT   MEDICATIONS:  1. Resume all your other meds from home.  2.  AZO (over-the-counter) can help with the burning/stinging when you urinate.   ACTIVITY:  1. May resume regular activities in 24 hours. 2. No driving while on narcotic pain medications  3. Drink plenty of water  4. Continue to walk at home - you can still get blood clots when you are at home, so keep active, but don't over do it.  5. May return to work/school tomorrow or when you feel ready    SIGNS/SYMPTOMS TO CALL:  Common postoperative symptoms include urinary frequency, urgency, bladder spasm and blood in the urine  Please call us if you have a fever greater than 101.5, uncontrolled nausea/vomiting, uncontrolled pain, dizziness, unable to urinate, excessively bloody urine, chest pain, shortness of breath, leg swelling, leg pain, or any other concerns or questions.   You can reach [place] at [phone].   FOLLOW-UP:  1. You we will be contacted for an appointment next week for stent removal   AMBULATORY SURGERY  DISCHARGE INSTRUCTIONS   The drugs that you were given will stay in your system until tomorrow so for the next 24 hours you should not:  Drive an automobile Make any legal decisions Drink any alcoholic beverage   You may resume regular meals tomorrow.  Today it is better to start with liquids and gradually work up to solid foods.  You may eat anything you prefer, but it is better to start with liquids, then soup and crackers, and gradually work up to solid foods.   Please notify your doctor immediately if you have any unusual bleeding, trouble breathing, redness and pain at the surgery site, drainage, fever, or pain not relieved by medication.    Your post-operative visit with Dr.                                       is: Date:                        Time:    Please call to schedule your post-operative visit.  Additional Instructions:

## 2020-10-25 NOTE — Progress Notes (Addendum)
Patient c/o heaviness/pain to left arm and SHOB.  C/o feeling dizzy and "not feeling right".  Does not appear to feel well. VSS.  Dr Bertell Maria called and EKG started (done at 1208).  EKG reviewed by dr Bertell Maria.  Will continue to monitor. Equal upper and lower extremity strength. No drift. Asymmetry to face but baseline from previous CVA per pt. Sensation same bilateral. Denies numbness.

## 2020-10-25 NOTE — Op Note (Signed)
Preoperative diagnosis: Left nephrolithiasis   Postoperative diagnosis: Left nephrolithiasis  Procedure:  Cystoscopy Left ureteroscopy and stone removal Ureteroscopic laser lithotripsy Left ureteral stent exchange (6FR/24 cm)  Left retrograde pyelography with interpretation  Surgeon: Nicki Reaper C. Teejay Meader, M.D.  Anesthesia: General  Complications: None  Intraoperative findings:  Cystoscopy-bladder mucosa without erythema, solid or papillary lesions.  Inflammatory changes left hemitrigone secondary to indwelling stent Left ureteropyeloscopy-distal, mid and proximal ureter normal in appearance.  Left 18 mm renal pelvic calculus easily identified as well as the left lower pole calculus Left retrograde pyelography demonstrated a filling defect within the renal pelvis consistent with the patient's known calculus without other abnormalities.  Repeat retrograde pyelography post procedure showed no extravasation or filling defects   EBL: Minimal  Specimens: Calculus fragments for analysis   Indication: Jessica Long is a 75 y.o. female status post left ureteral stent placement for an obstructing 18 mm UPJ calculus with sepsis on 09/03/2020.  She presents for definitive stone treatment and also has a nonobstructing 14 mm left lower pole calculus. After reviewing the management options for treatment, the patient elected to proceed with the above surgical procedure(s). We have discussed the potential benefits and risks of the procedure, side effects of the proposed treatment, the likelihood of the patient achieving the goals of the procedure, and any potential problems that might occur during the procedure or recuperation. Informed consent has been obtained.  Description of procedure:  The patient was taken to the operating room and general anesthesia was induced.  The patient was placed in the dorsal lithotomy position, prepped and draped in the usual sterile fashion, and preoperative antibiotics  were administered. A preoperative time-out was performed.   A 22 French cystoscope was lubricated and passed per urethra.  Panendoscopy was performed with findings as described above.  The stent was grasped with endoscopic forceps and brought out to the urethral meatus.  A 0.038 Sensor wire was then advanced through the stent to the renal pelvis under fluoroscopic guidance.  The ureteral stent was removed and a dual-lumen ureteral catheter was placed over the guidewire. Omnipaque contrast was injected through the ureteral catheter and a retrograde pyelogram was performed with findings as dictated above.  A 12/14 French ureteral access sheath was placed over the working wire under fluoroscopic guidance without difficulty.  A dual channel digital flexible ureteroscope was placed through the access sheath and advanced into the renal pelvis without difficulty.  The calculi were as dictated under findings.  A 242 micron holmium laser fiber was placed through the ureteroscope and the renal pelvic calculus was dusted at a setting of 0.2 J/40 Hz.   Some larger fragments broke off the main calculus and settled in a midpole calyx and these were treated with noncontact laser lithotripsy at 0.6 J / 40 Hz  Fragments the larger than 1 mm were removed with a 1.9 [place] nitinol basket and sent for analysis.  Attention was then directed to the lower calyceal calculus.  Attempts were made to relocate the calculus within the mid pole calyx however when an Escape basket was placed portions of the stone broke off which were removed.  The ureteroscope was able to be maneuvered into the lower calyx and the calculus was dusted at 0.2 J / 40 Hz with noncontact laser lithotripsy at 0.6 J / 40 Hz for fragments >1 mm.  Retrograde pyelogram was performed and each calyx was sequentially examined under fluoroscopic guidance and no significant size fragments were identified.  The  ureteral access sheath and ureteroscope were  removed in tandem and the ureter showed no evidence of injury or perforation.  A stone fragment was identified in the proximal ureter which migrated to the distal ureter with removal of the sheath.  The flexible ureteroscope was placed back into the ureter and this fragment was removed with a 1.9 [place] nitinol basket  A 6 FR/24 CM Contour ureteral stent was placed under fluoroscopic guidance.  The wire was then removed with an adequate stent curl noted in the renal pelvis as well as in the bladder.  The bladder was then emptied and the procedure ended.  The patient appeared to tolerate the procedure well and without complications.  After anesthetic reversal the patient was transported to the PACU in stable condition.   Plan: Office follow-up approximately 1 week for cystoscopy with stent removal   John Giovanni, MD

## 2020-10-25 NOTE — Transfer of Care (Signed)
Immediate Anesthesia Transfer of Care Note  Patient: Jessica Long  Procedure(s) Performed: CYSTOSCOPY/URETEROSCOPY/HOLMIUM LASER/STENT EXCHANGE (Left)  Patient Location: PACU  Anesthesia Type:General  Level of Consciousness: awake, oriented and drowsy  Airway & Oxygen Therapy: Patient Spontanous Breathing and Patient connected to face mask oxygen  Post-op Assessment: Report given to RN and Post -op Vital signs reviewed and stable  Post vital signs: Reviewed and stable  Last Vitals:  Vitals Value Taken Time  BP 141/69 10/25/20 1052  Temp 36.3 C 10/25/20 1052  Pulse 65 10/25/20 1054  Resp 15 10/25/20 1054  SpO2 94 % 10/25/20 1054  Vitals shown include unvalidated device data.  Last Pain:  Vitals:   10/25/20 1052  TempSrc:   PainSc: 0-No pain         Complications: No notable events documented.

## 2020-10-25 NOTE — Interval H&P Note (Signed)
History and Physical Interval Note:  10/25/2020 8:45 AM  Jessica Long  has presented today for surgery, with the diagnosis of left nephrolithiasis.  The various methods of treatment have been discussed with the patient and family. After consideration of risks, benefits and other options for treatment, the patient has consented to  Procedure(s): CYSTOSCOPY/URETEROSCOPY/HOLMIUM LASER/STENT EXCHANGE (Left) as a surgical intervention.  The patient's history has been reviewed, patient examined, no change in status, stable for surgery.  I have reviewed the patient's chart and labs.  Questions were answered to the patient's satisfaction.     [place]

## 2020-10-25 NOTE — Progress Notes (Signed)
Called to PACU for patient's complaints of dyspnea and chest pain.  Patient is stating her left shoulder, chest, and arm feel heavy, and she "doesn't feel good". She says she feels a little dizzy and it feels hard to catch her breath. She does not appear to visibly be in distress.  Lungs CTAB, Heart sounds normal, minimal pedal edema. vital signs stable. EKG obtained and was similar to historical one, with no clear ST segment changes. Patient did not take her diuretics today. No hx of MI.  Given patient's hemodynamic stability, normal saturations, and no clear evidence of volume overload, will obtain updated CBC, BMP, and troponin to assess for any electrolyte or  cardiac damage.

## 2020-10-25 NOTE — Anesthesia Procedure Notes (Signed)
Procedure Name: Intubation Date/Time: 10/25/2020 9:05 AM Performed by: Kelton Pillar, CRNA Pre-anesthesia Checklist: Patient identified, Emergency Drugs available, Suction available and Patient being monitored Patient Re-evaluated:Patient Re-evaluated prior to induction Oxygen Delivery Method: Circle system utilized Preoxygenation: Pre-oxygenation with 100% oxygen Induction Type: IV induction Ventilation: Mask ventilation without difficulty Laryngoscope Size: McGraph and 3 Grade View: Grade I Tube type: Oral Tube size: 6.5 mm Number of attempts: 1 Airway Equipment and Method: Stylet and Oral airway Placement Confirmation: ETT inserted through vocal cords under direct vision, positive ETCO2, breath sounds checked- equal and bilateral and CO2 detector Secured at: 21 cm Tube secured with: Tape Dental Injury: Teeth and Oropharynx as per pre-operative assessment

## 2020-10-25 NOTE — Anesthesia Preprocedure Evaluation (Signed)
Anesthesia Evaluation  Patient identified by MRN, date of birth, ID band Patient awake    Reviewed: Allergy & Precautions, NPO status , Patient's Chart, lab work & pertinent test results  History of Anesthesia Complications Negative for: history of anesthetic complications  Airway Mallampati: II  TM Distance: >3 FB Neck ROM: Full    Dental  (+) Edentulous Upper, Missing, Poor Dentition   Pulmonary neg sleep apnea, COPD, former smoker,    breath sounds clear to auscultation- rhonchi (-) wheezing      Cardiovascular (-) hypertension+CHF  (-) CAD, (-) Past MI, (-) Cardiac Stents and (-) CABG + dysrhythmias Atrial Fibrillation  Rhythm:Regular Rate:Normal - Systolic murmurs and - Diastolic murmurs    Neuro/Psych neg Seizures PSYCHIATRIC DISORDERS Depression CVA, Residual Symptoms    GI/Hepatic negative GI ROS, Neg liver ROS,   Endo/Other  negative endocrine ROSneg diabetes  Renal/GU Renal disease (nephrolithiasis)     Musculoskeletal  (+) Arthritis ,   Abdominal (+) - obese,   Peds  Hematology negative hematology ROS (+)   Anesthesia Other Findings Past Medical History: No date: Allergic rhinitis No date: Aortic atherosclerosis (HCC) No date: Atrial fibrillation (HCC) No date: Breast cancer, right (HCC) No date: CHF (congestive heart failure) (HCC) No date: CKD (chronic kidney disease) No date: Current use of long term anticoagulation     Comment:  Warfarin No date: Depression No date: History of kidney stones No date: Hyperlipemia No date: Osteoarthritis 2011: Stroke [facility])   Reproductive/Obstetrics                             Anesthesia Physical Anesthesia Plan  ASA: 3  Anesthesia Plan: General   Post-op Pain Management:    Induction: Intravenous  PONV Risk Score and Plan: 2 and Ondansetron and Dexamethasone  Airway Management Planned: Oral ETT  Additional Equipment:    Intra-op Plan:   Post-operative Plan: Extubation in OR  Informed Consent: I have reviewed the patients History and Physical, chart, labs and discussed the procedure including the risks, benefits and alternatives for the proposed anesthesia with the patient or authorized representative who has indicated his/her understanding and acceptance.     Dental advisory given  Plan Discussed with: CRNA and Anesthesiologist  Anesthesia Plan Comments:         Anesthesia Quick Evaluation

## 2020-10-25 NOTE — Anesthesia Postprocedure Evaluation (Signed)
Anesthesia Post Note  Patient: Jessica Long  Procedure(s) Performed: CYSTOSCOPY/URETEROSCOPY/HOLMIUM LASER/STENT EXCHANGE (Left)  Patient location during evaluation: PACU Anesthesia Type: General Level of consciousness: awake and alert, awake and oriented Pain management: pain level controlled Vital Signs Assessment: post-procedure vital signs reviewed and stable Respiratory status: spontaneous breathing, nonlabored ventilation and respiratory function stable Cardiovascular status: blood pressure returned to baseline and stable Postop Assessment: no apparent nausea or vomiting Anesthetic complications: no Comments: On arrival to PACU pt had generalized complaints of "not feeling well", mild chest pain.  Complete workup performed, ECG, labs, troponin, cxr all entirely normal.  Observed for several more hours, pt feeling much better, eating, drinking without complaints.  Pt comfortable to return to assisted living.    No notable events documented.   Last Vitals:  Vitals:   10/25/20 1345 10/25/20 1400  BP: 107/64 117/81  Pulse: 61 67  Resp: (!) 23 (!) 23  Temp:    SpO2: 96% 93%    Last Pain:  Vitals:   10/25/20 1345  TempSrc:   PainSc: 3                  Phill Mutter

## 2020-10-25 NOTE — Interval H&P Note (Signed)
History and Physical Interval Note:  The procedure was again discussed and we discussed the possibility of a staged procedure secondary to stone burden.  She indicated all questions were answered and desires to proceed.  10/25/2020 8:45 AM  Jessica Long  has presented today for surgery, with the diagnosis of left nephrolithiasis.  The various methods of treatment have been discussed with the patient and family. After consideration of risks, benefits and other options for treatment, the patient has consented to  Procedure(s): CYSTOSCOPY/URETEROSCOPY/HOLMIUM LASER/STENT EXCHANGE (Left) as a surgical intervention.  The patient's history has been reviewed, patient examined, no change in status, stable for surgery.  I have reviewed the patient's chart and labs.  Questions were answered to the patient's satisfaction.     [place]

## 2020-10-26 ENCOUNTER — Encounter: Payer: Self-pay | Admitting: Urology

## 2020-10-26 ENCOUNTER — Other Ambulatory Visit: Payer: Self-pay | Admitting: *Deleted

## 2020-10-26 DIAGNOSIS — N200 Calculus of kidney: Secondary | ICD-10-CM

## 2020-11-03 ENCOUNTER — Ambulatory Visit
Admission: RE | Admit: 2020-11-03 | Discharge: 2020-11-03 | Disposition: A | Discharge status: Home / Self Care | Payer: Commercial Managed Care - HMO | Source: Ambulatory Visit | Attending: Urology | Admitting: Urology

## 2020-11-03 ENCOUNTER — Ambulatory Visit (INDEPENDENT_AMBULATORY_CARE_PROVIDER_SITE_OTHER): Payer: Commercial Managed Care - HMO | Admitting: Urology

## 2020-11-03 ENCOUNTER — Other Ambulatory Visit: Payer: Self-pay

## 2020-11-03 ENCOUNTER — Encounter: Payer: Self-pay | Admitting: Urology

## 2020-11-03 VITALS — BP 108/72 | HR 76 | Ht 68.0 in | Wt 175.0 lb

## 2020-11-03 DIAGNOSIS — N200 Calculus of kidney: Secondary | ICD-10-CM

## 2020-11-04 LAB — URINALYSIS, COMPLETE
Bilirubin, UA: NEGATIVE
Glucose, UA: NEGATIVE
Ketones, UA: NEGATIVE
Nitrite, UA: NEGATIVE
Protein,UA: NEGATIVE
Specific Gravity, UA: 1.01 (ref 1.005–1.030)
Urobilinogen, Ur: 0.2 mg/dL (ref 0.2–1.0)
pH, UA: 7 (ref 5.0–7.5)

## 2020-11-04 LAB — MICROSCOPIC EXAMINATION: Epithelial Cells (non renal): >10 /hpf — AB (ref 0–10)

## 2020-11-07 NOTE — Progress Notes (Signed)
Patient was scheduled for stent removal today.  I was called to the OR and was detained and she was rescheduled.  KUB was reviewed and no stone fragments were identified

## 2020-11-11 ENCOUNTER — Ambulatory Visit (INDEPENDENT_AMBULATORY_CARE_PROVIDER_SITE_OTHER): Payer: Commercial Managed Care - HMO | Admitting: Urology

## 2020-11-11 ENCOUNTER — Other Ambulatory Visit: Payer: Self-pay

## 2020-11-11 ENCOUNTER — Encounter: Payer: Self-pay | Admitting: Urology

## 2020-11-11 VITALS — BP 143/85 | HR 125 | Ht 62.0 in | Wt 175.0 lb

## 2020-11-11 DIAGNOSIS — Z466 Encounter for fitting and adjustment of urinary device: Secondary | ICD-10-CM

## 2020-11-11 DIAGNOSIS — N200 Calculus of kidney: Secondary | ICD-10-CM

## 2020-11-11 MED ORDER — LEVOFLOXACIN 500 MG PO TABS
500.0000 mg | ORAL_TABLET | Freq: Once | ORAL | Status: AC
  Administered 2020-11-11: 500 mg via ORAL

## 2020-11-11 MED ORDER — LIDOCAINE HCL URETHRAL/MUCOSAL 2 % EX GEL
1.0000 | Freq: Once | CUTANEOUS | Status: AC
  Administered 2020-11-11: 1 via URETHRAL

## 2020-11-11 NOTE — Progress Notes (Signed)
Indications:  75 y.o. female s/p ureteroscopic removal of 18 and 14 mm left renal calculi on 10/25/2020.  The patient is presenting today for stent removal.  She had no postoperative problems and has no complaints today.  KUB performed last week showed no residual stone fragments  Procedure:  Flexible Cystoscopy with stent removal (04599)  Timeout was performed and the correct patient, procedure and participants were identified.    Description:  The patient was prepped and draped in the usual sterile fashion. Flexible cystosopy was performed.  The stent was visualized, grasped, and removed intact without difficulty. The patient tolerated the procedure well.  A single dose of oral antibiotics was given.  Complications:  None  Plan:  Instructed to call for fever or flank pain post stent removal Stone analysis was pending and follow-up visit 1 month for recheck, review of stone analysis and discussion of metabolic evaluation

## 2020-11-12 LAB — URINALYSIS, COMPLETE
Bilirubin, UA: NEGATIVE
Glucose, UA: NEGATIVE
Ketones, UA: NEGATIVE
Nitrite, UA: NEGATIVE
Protein,UA: NEGATIVE
Specific Gravity, UA: 1.01 (ref 1.005–1.030)
Urobilinogen, Ur: 0.2 mg/dL (ref 0.2–1.0)
pH, UA: 7 (ref 5.0–7.5)

## 2020-11-12 LAB — MICROSCOPIC EXAMINATION

## 2020-11-14 LAB — CALCULI, WITH PHOTOGRAPH (CLINICAL LAB)
Hydroxyapatite: 100 %
Weight Calculi: 61 mg

## 2020-12-12 ENCOUNTER — Ambulatory Visit (INDEPENDENT_AMBULATORY_CARE_PROVIDER_SITE_OTHER): Payer: Medicare Other | Admitting: Urology

## 2020-12-12 ENCOUNTER — Encounter: Payer: Self-pay | Admitting: Urology

## 2020-12-12 ENCOUNTER — Other Ambulatory Visit: Payer: Self-pay

## 2020-12-12 VITALS — BP 115/71 | HR 109 | Ht 68.0 in | Wt 177.8 lb

## 2020-12-12 DIAGNOSIS — N200 Calculus of kidney: Secondary | ICD-10-CM | POA: Diagnosis not present

## 2020-12-12 NOTE — Patient Instructions (Signed)

## 2020-12-12 NOTE — Progress Notes (Signed)
12/12/2020 3:07 PM   [name] [id number] [id number]  Referring provider: [facility] [place],  [place] [id number]  Chief Complaint  Patient presents with   Nephrolithiasis    HPI: 75 y.o. female presents for postop follow-up.  Status post ureteroscopic removal of 14 and 18 mm left renal calculi 10/25/2020 Stent was removed 11/11/2020 No problems post stent removal including flank/abdominal pain Stone analysis 100% hydroxyapatite   PMH: Past Medical History:  Diagnosis Date   Allergic rhinitis    Aortic atherosclerosis (HCC)    Atrial fibrillation (HCC)    Breast cancer, right (HCC)    CHF (congestive heart failure) (HCC)    CKD (chronic kidney disease)    Current use of long term anticoagulation    Warfarin   Depression    History of kidney stones    Hyperlipemia    Osteoarthritis    Stroke [facility]) 2011    Surgical History: Past Surgical History:  Procedure Laterality Date   CYSTOSCOPY WITH URETEROSCOPY AND STENT PLACEMENT Left 09/03/2020   Procedure: CYSTOSCOPY  AND LEFT STENT PLACEMENT;  Surgeon: Ceasar Mons, MD;  Location: [facility] ORS;  Service: Urology;  Laterality: Left;   CYSTOSCOPY/URETEROSCOPY/HOLMIUM LASER/STENT PLACEMENT Left 10/25/2020   Procedure: CYSTOSCOPY/URETEROSCOPY/HOLMIUM LASER/STENT EXCHANGE;  Surgeon: Abbie Sons, MD;  Location: [facility] ORS;  Service: Urology;  Laterality: Left;   MASTECTOMY Right 2007    Home Medications:  Allergies as of 12/12/2020       Reactions   Amoxicillin Swelling   Oral swelling   Ampicillin Swelling   Oral swelling        Medication List        Accurate as of December 12, 2020  3:07 PM. If you have any questions, ask your nurse or doctor.          acetaminophen 500 MG tablet Commonly known as: TYLENOL Take 2 tablets (1,000 mg total) by mouth every 8 (eight) hours as needed for moderate pain or headache. What changed: when to take this    buPROPion 300 MG 24 hr tablet Commonly known as: WELLBUTRIN XL Take 300 mg by mouth daily.   cetirizine 10 MG tablet Commonly known as: ZYRTEC Take 10 mg by mouth daily.   famotidine 10 MG tablet Commonly known as: PEPCID Take 1 tablet (10 mg total) by mouth at bedtime.   fluticasone 50 MCG/ACT nasal spray Commonly known as: FLONASE Place 2 sprays into both nostrils daily.   metoprolol tartrate 25 MG tablet Commonly known as: LOPRESSOR Take 0.5 tablets (12.5 mg total) by mouth 2 (two) times daily.   Multivitamin Plus Iron Adult Tabs Take 1 tablet by mouth daily.   polyethylene glycol 17 g packet Commonly known as: MIRALAX / GLYCOLAX Take 34 g by mouth daily as needed. What changed: reasons to take this   potassium chloride SA 20 MEQ tablet Commonly known as: KLOR-CON Take 20 mEq by mouth daily.   selenium sulfide 1 % Lotn Commonly known as: SELSUN Apply 1 application topically 2 (two) times a week.   simvastatin 10 MG tablet Commonly known as: ZOCOR Take 10 mg by mouth at bedtime.   sodium chloride 0.65 % Soln nasal spray Commonly known as: OCEAN Place 2 sprays into both nostrils as needed for congestion.   tamsulosin 0.4 MG Caps capsule Commonly known as: FLOMAX Take 1 capsule (0.4 mg total) by mouth daily.   torsemide 20 MG tablet Commonly known as: DEMADEX Take 20  mg by mouth daily.   vitamin B-12 250 MCG tablet Commonly known as: CYANOCOBALAMIN Take 250 mcg by mouth daily.   warfarin 2.5 MG tablet Commonly known as: COUMADIN Take 2.5 mg by mouth daily.   zolpidem 5 MG tablet Commonly known as: AMBIEN Take 0.5 tablets (2.5 mg total) by mouth at bedtime as needed for sleep. What changed: when to take this        Allergies:  Allergies  Allergen Reactions   Amoxicillin Swelling    Oral swelling   Ampicillin Swelling    Oral swelling    Family History: Family History  Problem Relation Age of Onset   Heart disease Mother    Heart  attack Father    Parkinson's disease Sister    Heart disease Sister    Heart attack Brother    COPD Sister     Social History:  reports that she quit smoking about 11 years ago. Her smoking use included cigarettes. She has never used smokeless tobacco. She reports that she does not drink alcohol and does not use drugs.   Physical Exam: BP 115/71 (BP Location: Left Arm, Patient Position: Sitting, Cuff Size: Large)   Pulse (!) 109   Ht 5\' 8"  (1.727 m)   Wt 177 lb 12.8 oz (80.6 kg)   BMI 27.03 kg/m   Constitutional:  Alert and oriented, No acute distress. HEENT: [place] AT, moist mucus membranes.  Trachea midline, no masses. Cardiovascular: No clubbing, cyanosis, or edema. Respiratory: Normal respiratory effort, no increased work of breathing.   Assessment & Plan:    1.  Recurrent nephrolithiasis Recommend metabolic evaluation to include 24-hour urine study and blood work Will order through [employer] and 24-hour collection reviewed Will call with results   Abbie Sons, [place] [address], [place],  [address] [phone]

## 2020-12-13 ENCOUNTER — Encounter: Payer: Self-pay | Admitting: Urology

## 2020-12-13 DIAGNOSIS — N200 Calculus of kidney: Secondary | ICD-10-CM | POA: Insufficient documentation

## 2021-01-26 ENCOUNTER — Other Ambulatory Visit: Payer: Medicare Other

## 2021-01-26 ENCOUNTER — Other Ambulatory Visit: Payer: Self-pay

## 2021-02-09 ENCOUNTER — Other Ambulatory Visit: Payer: Self-pay | Admitting: Urology

## 2021-02-12 ENCOUNTER — Other Ambulatory Visit: Payer: Self-pay | Admitting: Urology

## 2021-02-12 ENCOUNTER — Telehealth: Payer: Self-pay | Admitting: Urology

## 2021-02-12 DIAGNOSIS — N2589 Other disorders resulting from impaired renal tubular function: Secondary | ICD-10-CM

## 2021-02-12 NOTE — Telephone Encounter (Signed)
Patient's metabolic stone evaluation showed excellent urine volume at 2.82 L.  She did have low citrate levels and elevated urine pH which can lead to stone formation due to a condition called renal tubular acidosis.  Recommend a nephrology appointment for further evaluation.  I have placed a consult and they should be calling her to schedule an appointment.

## 2021-02-13 NOTE — Telephone Encounter (Signed)
Notified patient niece as instructed, patient pleased. Discussed follow-up appointments, patient agrees

## 2021-07-22 ENCOUNTER — Other Ambulatory Visit: Payer: Self-pay

## 2021-07-22 ENCOUNTER — Encounter: Payer: Self-pay | Admitting: *Deleted

## 2021-07-22 ENCOUNTER — Observation Stay
Admission: EM | Admit: 2021-07-22 | Discharge: 2021-07-24 | Disposition: A | Discharge status: Home / Self Care | Payer: Medicare Other | Attending: Internal Medicine | Admitting: Internal Medicine

## 2021-07-22 DIAGNOSIS — Z79899 Other long term (current) drug therapy: Secondary | ICD-10-CM | POA: Diagnosis not present

## 2021-07-22 DIAGNOSIS — I5030 Unspecified diastolic (congestive) heart failure: Secondary | ICD-10-CM | POA: Diagnosis not present

## 2021-07-22 DIAGNOSIS — N200 Calculus of kidney: Secondary | ICD-10-CM | POA: Insufficient documentation

## 2021-07-22 DIAGNOSIS — N1831 Chronic kidney disease, stage 3a: Secondary | ICD-10-CM | POA: Diagnosis not present

## 2021-07-22 DIAGNOSIS — I4891 Unspecified atrial fibrillation: Secondary | ICD-10-CM | POA: Diagnosis present

## 2021-07-22 DIAGNOSIS — E871 Hypo-osmolality and hyponatremia: Secondary | ICD-10-CM

## 2021-07-22 DIAGNOSIS — Z20822 Contact with and (suspected) exposure to covid-19: Secondary | ICD-10-CM | POA: Diagnosis not present

## 2021-07-22 DIAGNOSIS — Z8673 Personal history of transient ischemic attack (TIA), and cerebral infarction without residual deficits: Secondary | ICD-10-CM

## 2021-07-22 DIAGNOSIS — Z853 Personal history of malignant neoplasm of breast: Secondary | ICD-10-CM | POA: Diagnosis not present

## 2021-07-22 DIAGNOSIS — I480 Paroxysmal atrial fibrillation: Secondary | ICD-10-CM | POA: Insufficient documentation

## 2021-07-22 DIAGNOSIS — I482 Chronic atrial fibrillation, unspecified: Secondary | ICD-10-CM | POA: Diagnosis present

## 2021-07-22 DIAGNOSIS — R197 Diarrhea, unspecified: Secondary | ICD-10-CM | POA: Diagnosis not present

## 2021-07-22 DIAGNOSIS — Z87891 Personal history of nicotine dependence: Secondary | ICD-10-CM | POA: Diagnosis not present

## 2021-07-22 DIAGNOSIS — Z7901 Long term (current) use of anticoagulants: Secondary | ICD-10-CM

## 2021-07-22 DIAGNOSIS — R112 Nausea with vomiting, unspecified: Principal | ICD-10-CM

## 2021-07-22 DIAGNOSIS — J440 Chronic obstructive pulmonary disease with acute lower respiratory infection: Secondary | ICD-10-CM | POA: Insufficient documentation

## 2021-07-22 DIAGNOSIS — J439 Emphysema, unspecified: Secondary | ICD-10-CM | POA: Diagnosis present

## 2021-07-22 DIAGNOSIS — Z9889 Other specified postprocedural states: Secondary | ICD-10-CM

## 2021-07-22 DIAGNOSIS — R1110 Vomiting, unspecified: Secondary | ICD-10-CM | POA: Diagnosis present

## 2021-07-22 LAB — CBC WITH DIFFERENTIAL/PLATELET
Abs Immature Granulocytes: 0.18 10*3/uL — ABNORMAL HIGH (ref 0.00–0.07)
Basophils Absolute: 0 10*3/uL (ref 0.0–0.1)
Basophils Relative: 0 %
Eosinophils Absolute: 0.1 10*3/uL (ref 0.0–0.5)
Eosinophils Relative: 0 %
HCT: 49.2 % — ABNORMAL HIGH (ref 36.0–46.0)
Hemoglobin: 15.9 g/dL — ABNORMAL HIGH (ref 12.0–15.0)
Immature Granulocytes: 1 %
Lymphocytes Relative: 5 %
Lymphs Abs: 0.8 10*3/uL (ref 0.7–4.0)
MCH: 29.9 pg (ref 26.0–34.0)
MCHC: 32.3 g/dL (ref 30.0–36.0)
MCV: 92.5 fL (ref 80.0–100.0)
Monocytes Absolute: 0.9 10*3/uL (ref 0.1–1.0)
Monocytes Relative: 6 %
Neutro Abs: 14.9 10*3/uL — ABNORMAL HIGH (ref 1.7–7.7)
Neutrophils Relative %: 88 %
Platelets: 237 10*3/uL (ref 150–400)
RBC: 5.32 MIL/uL — ABNORMAL HIGH (ref 3.87–5.11)
RDW: 16.3 % — ABNORMAL HIGH (ref 11.5–15.5)
WBC: 16.9 10*3/uL — ABNORMAL HIGH (ref 4.0–10.5)
nRBC: 0 % (ref 0.0–0.2)

## 2021-07-22 LAB — COMPREHENSIVE METABOLIC PANEL
ALT: 22 U/L (ref 0–44)
AST: 28 U/L (ref 15–41)
Albumin: 3.9 g/dL (ref 3.5–5.0)
Alkaline Phosphatase: 56 U/L (ref 38–126)
Anion gap: 13 (ref 5–15)
BUN: 18 mg/dL (ref 8–23)
CO2: 22 mmol/L (ref 22–32)
Calcium: 8.7 mg/dL — ABNORMAL LOW (ref 8.9–10.3)
Chloride: 94 mmol/L — ABNORMAL LOW (ref 98–111)
Creatinine, Ser: 1.09 mg/dL — ABNORMAL HIGH (ref 0.44–1.00)
GFR, Estimated: 52 mL/min — ABNORMAL LOW (ref >60)
Glucose, Bld: 129 mg/dL — ABNORMAL HIGH (ref 70–99)
Potassium: 3.9 mmol/L (ref 3.5–5.1)
Sodium: 129 mmol/L — ABNORMAL LOW (ref 135–145)
Total Bilirubin: 1.3 mg/dL — ABNORMAL HIGH (ref 0.3–1.2)
Total Protein: 7.2 g/dL (ref 6.5–8.1)

## 2021-07-22 LAB — RESP PANEL BY RT-PCR (FLU A&B, COVID) ARPGX2
Influenza A by PCR: NEGATIVE
Influenza B by PCR: NEGATIVE
SARS Coronavirus 2 by RT PCR: NEGATIVE

## 2021-07-22 LAB — LIPASE, BLOOD: Lipase: 39 U/L (ref 11–51)

## 2021-07-22 MED ORDER — ONDANSETRON HCL 4 MG/2ML IJ SOLN
4.0000 mg | Freq: Once | INTRAMUSCULAR | Status: AC
  Administered 2021-07-22: 4 mg via INTRAVENOUS
  Filled 2021-07-22: qty 2

## 2021-07-22 MED ORDER — SODIUM CHLORIDE 0.9 % IV BOLUS
500.0000 mL | Freq: Once | INTRAVENOUS | Status: AC
  Administered 2021-07-22: 500 mL via INTRAVENOUS

## 2021-07-22 NOTE — Assessment & Plan Note (Signed)
History of septic shock secondary to obstructing ureteral calculi with stent placement ?Follow-up CT abdomen and pelvis ?Follow-up urinalysis ?

## 2021-07-22 NOTE — Assessment & Plan Note (Signed)
Not acutely exacerbated DuoNebs as needed 

## 2021-07-22 NOTE — Assessment & Plan Note (Signed)
Renal function at baseline 

## 2021-07-22 NOTE — ED Triage Notes (Signed)
Pt bib ems for c/o n/v/d that started today around noon; pt was given zofran at 2pm with relief but now pt is experiencing sob; pt c/o left hip pain ?

## 2021-07-22 NOTE — Assessment & Plan Note (Addendum)
Etiology uncertain.  Possible acute gastroenteritis ? CT abdomen and pelvis with staghorn calculus and moderate hiatal hernia.  Urinalysis still pending ?N.p.o. except for ice chips ?IV hydration ?GI panel if diarrhea ?

## 2021-07-22 NOTE — Assessment & Plan Note (Addendum)
No acute disease suspected ?Continue statin ?Ambulates with walker at baseline ?

## 2021-07-22 NOTE — ED Provider Notes (Signed)
? ?[facility] ?Provider Note ? ? ? Event Date/Time  ? First MD Initiated Contact with Patient 07/22/21 2009   ?  (approximate) ? ? ?History  ? ?Emesis ? ? ?HPI ? ?Jessica Long is a 76 y.o. female with a history of A-fib on Coumadin, CHF, chronic kidney disease, and admission last year for septic shock due to ureteral stone who presents with nausea and vomiting, acute onset this afternoon after eating lunch, persistent course since then, and now associated with diarrhea and some lower abdominal cramping.  She denies fever, difficulty breathing, or other acute symptoms.  She has no sick contacts. ? ? ? ?Physical Exam  ? ?Triage Vital Signs: ?ED Triage Vitals [07/22/21 2009]  ?Enc Vitals Group  ?   BP (!) 148/113  ?   Pulse Rate 87  ?   Resp 14  ?   Temp 97.7 ?F (36.5 ?C)  ?   Temp Source Oral  ?   SpO2 100 %  ?   Weight 179 lb (81.2 kg)  ?   Height '5\' 8"'$  (1.727 m)  ?   Head Circumference   ?   Peak Flow   ?   Pain Score 7  ?   Pain Loc   ?   Pain Edu?   ?   Excl. in [place]?   ? ? ?Most recent vital signs: ?Vitals:  ? 07/22/21 2200 07/22/21 2230  ?BP: 122/75 115/62  ?Pulse: 84 87  ?Resp: 17 16  ?Temp:    ?SpO2: 96% 94%  ? ? ? ?General: Alert and oriented, somewhat weak appearing but in no acute distress. ?CV:  Good peripheral perfusion.  ?Resp:  Normal effort.  ?Abd:  Soft, nontender.  No distention.  ?Other:  Dry mucous membranes.  No scleral icterus. ? ? ?ED Results / Procedures / Treatments  ? ?Labs ?(all labs ordered are listed, but only abnormal results are displayed) ?Labs Reviewed  ?COMPREHENSIVE METABOLIC PANEL - Abnormal; Notable for the following components:  ?    Result Value  ? Sodium 129 (*)   ? Chloride 94 (*)   ? Glucose, Bld 129 (*)   ? Creatinine, Ser 1.09 (*)   ? Calcium 8.7 (*)   ? Total Bilirubin 1.3 (*)   ? GFR, Estimated 52 (*)   ? All other components within normal limits  ?CBC WITH DIFFERENTIAL/PLATELET - Abnormal; Notable for the following components:  ? WBC 16.9 (*)   ? RBC  5.32 (*)   ? Hemoglobin 15.9 (*)   ? HCT 49.2 (*)   ? RDW 16.3 (*)   ? Neutro Abs 14.9 (*)   ? Abs Immature Granulocytes 0.18 (*)   ? All other components within normal limits  ?RESP PANEL BY RT-PCR (FLU A&B, COVID) ARPGX2  ?LIPASE, BLOOD  ?URINALYSIS, ROUTINE W REFLEX MICROSCOPIC  ? ? ? ?EKG ? ?ED ECG REPORT ?IArta Silence, the attending physician, personally viewed and interpreted this ECG. ? ?Date: 07/22/2021 ?EKG Time: 2012 ?Rate: 90 ?Rhythm: normal sinus rhythm ?QRS Axis: normal ?Intervals: RBBB ?ST/T Wave abnormalities: Nonspecific T wave abnormalities ?Narrative Interpretation: Nonspecific T wave abnormality with no evidence of acute ischemia; no significant change when compared to EKG of 08/31/2020 ? ? ? ?RADIOLOGY ? ?CT abdomen/pelvis: Pending ? ?PROCEDURES: ? ?Critical Care performed: No ? ?Procedures ? ? ?MEDICATIONS ORDERED IN ED: ?Medications  ?iohexol (OMNIPAQUE) 300 MG/ML solution 100 mL (has no administration in time range)  ?ondansetron (ZOFRAN) injection 4 mg (  4 mg Intravenous Given 07/22/21 2125)  ?sodium chloride 0.9 % bolus 500 mL (0 mLs Intravenous Stopped 07/22/21 2236)  ? ? ? ?IMPRESSION / MDM / ASSESSMENT AND PLAN / ED COURSE  ?I reviewed the triage vital signs and the nursing notes. ? ?76 year old female with PMH as noted above presents with nausea and vomiting after eating lunch today, followed by diarrhea.  She has no active abdominal pain but does have some intermittent cramping. ? ?I reviewed the past medical records.  The patient was most recently admitted in May of last year and per the hospitalist discharge summary on 09/06/2020 she was treated for septic shock related to an obstructive UPJ stone. ? ?On exam the patient is somewhat weak appearing but in no acute distress.  Her vital signs are normal.  The abdomen is soft and nontender.  Mucous membranes are dry. ? ?EKG shows nonspecific T wave abnormalities, unchanged from prior. ? ?Differential diagnosis includes, but is not  limited to, gastroenteritis, foodborne illness, gastritis, gastroparesis, or less likely diverticulitis, colitis.  I have a low suspicion for bowel obstruction or other surgical emergency given the lack of abdominal distention or tenderness or any significant pain. ? ?We will obtain lab work-up, give fluids, antiemetics, and reassess. ? ?The patient is on the cardiac monitor to evaluate for evidence of arrhythmia and/or significant heart rate changes. ? ?----------------------------------------- ?12:18 AM on 07/23/2021 ?----------------------------------------- ? ?The patient reports persistent nausea although she is not actively vomiting.  Lab work-up is significant for leukocytosis and hyponatremia.  Respiratory panel is negative.  Given the leukocytosis and the persistent nausea I have ordered a CT for further evaluation.  I consulted Dr. Damita Dunnings from the hospitalist service and discussed the case with her.  We will contact her again once the CT and urinalysis are obtained for likely admission. ? ? ?FINAL CLINICAL IMPRESSION(S) / ED DIAGNOSES  ? ?Final diagnoses:  ?Nausea vomiting and diarrhea  ? ? ? ?Rx / DC Orders  ? ?ED Discharge Orders   ? ? None  ? ?  ? ? ? ?Note:  This document was prepared using Dragon voice recognition software and may include unintentional dictation errors.  ?  Arta Silence, MD ?07/23/21 0019 ? ?

## 2021-07-22 NOTE — Assessment & Plan Note (Signed)
Rate controlled ?Continue metoprolol and Coumadin with small sips ?

## 2021-07-22 NOTE — H&P (Signed)
?History and Physical  ? ? ?Patient: Jessica Long BTD:[id number] DOB: 1945-08-28 ?DOA: 07/22/2021 ?DOS: the patient was seen and examined on 07/22/2021 ?PCP: [facility]  ?Patient coming from: Home ? ?Chief Complaint:  ?Chief Complaint  ?Patient presents with  ? Emesis  ? ? ?HPI: Jessica Long is a 76 y.o. 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  She was treated with an IV fluid bolus and Zofran but continued to have vomiting.  A CT scan was ordered and hospitalist consulted for admission. ?ED course: On arrival BP 148/113 with otherwise normal vitals.  WBC 17,000.  Lactic acid not done.  Sodium 129, baseline 129-131.  Creatinine 1.09 which is around her baseline, total bili 1.3.  Lipase and LFTs WNL.  Urinalysis not done.  EKG, personally viewed and interpreted: NSR at 90 with RBBB and no acute ST-T wave changes.  COVID and flu negative.  ? ?CT report: ?IMPRESSION: ?No evidence of bowel obstruction.  Normal appendix. ?  ?Moderate hiatal hernia. ?  ?16 mm left lower pole partial staghorn calculus.  No hydronephrosis. ?  ?Additional ancillary findings as above. ? ?Review of Systems: As mentioned in the history of present illness. All other systems reviewed and are negative. ?Past Medical History:  ?Diagnosis Date  ? Allergic rhinitis   ? Aortic atherosclerosis ([place])   ? Atrial fibrillation ([place])   ? Breast cancer, right ([place])   ? CHF (congestive heart failure) ([place])   ? CKD (chronic kidney disease)   ? Current use of long term anticoagulation   ? Warfarin  ? Depression   ? History of kidney stones   ? Hyperlipemia    ? Osteoarthritis   ? Stroke [facility]) 2011  ? ?Past Surgical History:  ?Procedure Laterality Date  ? CYSTOSCOPY WITH URETEROSCOPY AND STENT PLACEMENT Left 09/03/2020  ? Procedure: CYSTOSCOPY  AND LEFT STENT PLACEMENT;  Surgeon: Ceasar Mons, MD;  Location: [facility] ORS;  Service: Urology;  Laterality: Left;  ? CYSTOSCOPY/URETEROSCOPY/HOLMIUM LASER/STENT PLACEMENT Left 10/25/2020  ? Procedure: CYSTOSCOPY/URETEROSCOPY/HOLMIUM LASER/STENT EXCHANGE;  Surgeon: Abbie Sons, MD;  Location: [facility] ORS;  Service: Urology;  Laterality: Left;  ? MASTECTOMY Right 2007  ? ?Social History:  reports that she quit smoking about 12 years ago. Her smoking use included cigarettes. She has never used smokeless tobacco. She reports that she does not drink alcohol and does not use drugs. ? ?Allergies  ?Allergen Reactions  ? Amoxicillin Swelling  ?  Oral swelling  ? Ampicillin Swelling  ?  Oral swelling  ? ? ?Family History  ?Problem Relation Age of Onset  ? Heart disease Mother   ? Heart attack Father   ? Parkinson's disease Sister   ? Heart disease Sister   ? Heart attack Brother   ? COPD Sister   ? ? ?Prior to Admission medications   ?Medication Sig Start Date End Date Taking? Authorizing Provider  ?acetaminophen (TYLENOL) 500 MG tablet Take 2 tablets (1,000 mg total) by mouth every 8 (eight) hours as needed for moderate pain or headache. ?Patient taking differently: Take 1,000 mg by mouth in the morning, at noon, and at bedtime. 09/06/20   Billie Ruddy,  Otila Kluver, MD  ?buPROPion (WELLBUTRIN XL) 300 MG 24 hr tablet Take 300 mg by mouth daily.    [provider]  ?cetirizine (ZYRTEC) 10 MG tablet Take 10 mg by mouth daily.    [provider]  ?famotidine (PEPCID) 10 MG tablet Take 1 tablet (10 mg total) by mouth at bedtime. 09/06/20   Enzo Bi, MD  ?fluticasone Asencion Islam) 50 MCG/ACT nasal spray Place 2 sprays into both nostrils daily.    [provider]  ?metoprolol tartrate (LOPRESSOR) 25 MG tablet Take 0.5 tablets  (12.5 mg total) by mouth 2 (two) times daily. 09/06/20   Enzo Bi, MD  ?Multiple Vitamins-Iron (MULTIVITAMIN PLUS IRON ADULT) TABS Take 1 tablet by mouth daily.    [provider]  ?polyethylene glycol (MIRALAX / GLYCOLAX) 17 g packet Take 34 g by mouth daily as needed. ?Patient taking differently: Take 34 g by mouth daily as needed for moderate constipation. 09/06/20   Enzo Bi, MD  ?potassium chloride SA (KLOR-CON) 20 MEQ tablet Take 20 mEq by mouth daily.    [provider]  ?ROBITUSSIN 12 HOUR COUGH 30 MG/5ML liquid SMARTSIG:5 Milliliter(s) By Mouth Every 12 Hours PRN 12/03/20   [provider]  ?selenium sulfide (SELSUN) 1 % LOTN Apply 1 application topically 2 (two) times a week.    [provider]  ?simvastatin (ZOCOR) 10 MG tablet Take 10 mg by mouth at bedtime.    [provider]  ?sodium chloride (OCEAN) 0.65 % SOLN nasal spray Place 2 sprays into both nostrils as needed for congestion.    [provider]  ?tamsulosin (FLOMAX) 0.4 MG CAPS capsule Take 1 capsule (0.4 mg total) by mouth daily. 09/06/20   Enzo Bi, MD  ?Torsemide 40 MG TABS Take by mouth. 10/12/20   [provider]  ?vitamin B-12 (CYANOCOBALAMIN) 250 MCG tablet Take 250 mcg by mouth daily.    [provider]  ?warfarin (COUMADIN) 2.5 MG tablet Take 2.5 mg by mouth daily.    [provider]  ?zolpidem (AMBIEN) 5 MG tablet Take 0.5 tablets (2.5 mg total) by mouth at bedtime as needed for sleep. ?Patient taking differently: Take 2.5 mg by mouth at bedtime. 09/06/20   Enzo Bi, MD  ? ? ?Physical Exam: ?Vitals:  ? 07/22/21 2100 07/22/21 2130 07/22/21 2200 07/22/21 2230  ?BP: 117/65 119/68 122/75 115/62  ?Pulse: 88 91 84 87  ?Resp: '16 16 17 16  '$ ?Temp:      ?TempSrc:      ?SpO2: 97% 99% 96% 94%  ?Weight:      ?Height:      ? ?Physical Exam ?Vitals and nursing note reviewed.  ?Constitutional:   ?   General: She is not in acute distress. ?   Appearance: Normal appearance.   ?HENT:  ?   Head: Normocephalic and atraumatic.  ?Cardiovascular:  ?   Rate and Rhythm: Normal rate and regular rhythm.  ?   Pulses: Normal pulses.  ?   Heart sounds: Normal heart sounds. No murmur heard. ?Pulmonary:  ?   Effort: Pulmonary effort is normal.  ?   Breath sounds: Normal breath sounds. No wheezing or rhonchi.  ?Abdominal:  ?   General: Bowel sounds are normal.  ?   Palpations: Abdomen is soft.  ?   Tenderness: There is no abdominal tenderness.  ?Musculoskeletal:     ?   General: No swelling or tenderness. Normal range of motion.  ?   Cervical back: Normal range of motion  and neck supple.  ?Skin: ?   General: Skin is warm and dry.  ?Neurological:  ?   Mental Status: She is alert. Mental status is at baseline.  ? ? ? ?Data Reviewed: ?Relevant notes from primary care and specialist visits, past discharge summaries as available in EHR, including Care Everywhere. ?Prior diagnostic testing as pertinent to current admission diagnoses ?Updated medications and problem lists for reconciliation ?ED course, including vitals, labs, imaging, treatment and response to treatment ?Triage notes, nursing and pharmacy notes and ED provider's notes ?Notable results as noted in HPI ? ? ?Assessment and Plan: ?* Intractable nausea and vomiting ?Etiology uncertain.  Possible acute gastroenteritis ? CT abdomen and pelvis with staghorn calculus and moderate hiatal hernia.  Urinalysis still pending ?N.p.o. except for ice chips ?IV hydration ?GI panel if diarrhea ? ?Chronic hyponatremia ?Suspect hypovolemic.  Mild worsening from chronic hyponatremia ?IV hydration with normal saline and monitor sodium ? ?History of lithotripsy ?History of septic shock secondary to obstructing ureteral calculi with stent placement ?Follow-up CT abdomen and pelvis ?Follow-up urinalysis ? ?Current use of long term anticoagulation ?Pharmacy consult for management of Coumadin ? ?Atrial fibrillation ([place]) ?Rate controlled ?Continue metoprolol and  Coumadin with small sips ? ?History of CVA (cerebrovascular accident) ?No acute disease suspected ?Continue statin ?Ambulates with walker at baseline ? ?Stage 3a chronic kidney disease ([place]) ?Renal function at baseline

## 2021-07-22 NOTE — Assessment & Plan Note (Signed)
Suspect hypovolemic.  Mild worsening from chronic hyponatremia ?IV hydration with normal saline and monitor sodium ?

## 2021-07-22 NOTE — Assessment & Plan Note (Signed)
Pharmacy consult for management of Coumadin ?

## 2021-07-23 ENCOUNTER — Emergency Department: Payer: Medicare Other

## 2021-07-23 ENCOUNTER — Observation Stay: Payer: Medicare Other

## 2021-07-23 DIAGNOSIS — Z9889 Other specified postprocedural states: Secondary | ICD-10-CM

## 2021-07-23 DIAGNOSIS — R197 Diarrhea, unspecified: Secondary | ICD-10-CM

## 2021-07-23 DIAGNOSIS — R1110 Vomiting, unspecified: Secondary | ICD-10-CM | POA: Diagnosis present

## 2021-07-23 DIAGNOSIS — E871 Hypo-osmolality and hyponatremia: Secondary | ICD-10-CM

## 2021-07-23 LAB — BASIC METABOLIC PANEL
Anion gap: 6 (ref 5–15)
BUN: 15 mg/dL (ref 8–23)
CO2: 26 mmol/L (ref 22–32)
Calcium: 8.3 mg/dL — ABNORMAL LOW (ref 8.9–10.3)
Chloride: 101 mmol/L (ref 98–111)
Creatinine, Ser: 1 mg/dL (ref 0.44–1.00)
GFR, Estimated: 58 mL/min — ABNORMAL LOW (ref >60)
Glucose, Bld: 102 mg/dL — ABNORMAL HIGH (ref 70–99)
Potassium: 4.3 mmol/L (ref 3.5–5.1)
Sodium: 133 mmol/L — ABNORMAL LOW (ref 135–145)

## 2021-07-23 LAB — URINALYSIS, ROUTINE W REFLEX MICROSCOPIC
Bilirubin Urine: NEGATIVE
Glucose, UA: NEGATIVE mg/dL
Hgb urine dipstick: NEGATIVE
Ketones, ur: NEGATIVE mg/dL
Leukocytes,Ua: NEGATIVE
Nitrite: NEGATIVE
Protein, ur: NEGATIVE mg/dL
Specific Gravity, Urine: 1.032 — ABNORMAL HIGH (ref 1.005–1.030)
pH: 6 (ref 5.0–8.0)

## 2021-07-23 LAB — CBC
HCT: 40.5 % (ref 36.0–46.0)
Hemoglobin: 13.5 g/dL (ref 12.0–15.0)
MCH: 30.2 pg (ref 26.0–34.0)
MCHC: 33.3 g/dL (ref 30.0–36.0)
MCV: 90.6 fL (ref 80.0–100.0)
Platelets: 248 10*3/uL (ref 150–400)
RBC: 4.47 MIL/uL (ref 3.87–5.11)
RDW: 16.4 % — ABNORMAL HIGH (ref 11.5–15.5)
WBC: 9.9 10*3/uL (ref 4.0–10.5)
nRBC: 0 % (ref 0.0–0.2)

## 2021-07-23 LAB — PROTIME-INR
INR: 1.4 — ABNORMAL HIGH (ref 0.8–1.2)
Prothrombin Time: 16.9 seconds — ABNORMAL HIGH (ref 11.4–15.2)

## 2021-07-23 MED ORDER — SODIUM CHLORIDE 0.9 % IV SOLN: INTRAVENOUS | Status: AC

## 2021-07-23 MED ORDER — WARFARIN SODIUM 4 MG PO TABS
4.0000 mg | ORAL_TABLET | Freq: Once | ORAL | Status: AC
  Administered 2021-07-23: 4 mg via ORAL
  Filled 2021-07-23: qty 1

## 2021-07-23 MED ORDER — ACETAMINOPHEN 325 MG PO TABS
650.0000 mg | ORAL_TABLET | Freq: Four times a day (QID) | ORAL | Status: DC | PRN
  Administered 2021-07-23 – 2021-07-24 (×2): 650 mg via ORAL
  Filled 2021-07-23 (×2): qty 2

## 2021-07-23 MED ORDER — ACETAMINOPHEN 650 MG RE SUPP: 650.0000 mg | Freq: Four times a day (QID) | RECTAL | Status: DC | PRN

## 2021-07-23 MED ORDER — ONDANSETRON HCL 4 MG/2ML IJ SOLN: 4.0000 mg | Freq: Four times a day (QID) | INTRAMUSCULAR | Status: DC | PRN

## 2021-07-23 MED ORDER — PANTOPRAZOLE SODIUM 40 MG IV SOLR
40.0000 mg | INTRAVENOUS | Status: DC
  Administered 2021-07-23 – 2021-07-24 (×2): 40 mg via INTRAVENOUS
  Filled 2021-07-23 (×2): qty 10

## 2021-07-23 MED ORDER — PROCHLORPERAZINE EDISYLATE 10 MG/2ML IJ SOLN: 10.0000 mg | Freq: Four times a day (QID) | INTRAMUSCULAR | Status: DC | PRN

## 2021-07-23 MED ORDER — WARFARIN SODIUM 2.5 MG PO TABS
2.5000 mg | ORAL_TABLET | Freq: Every day | ORAL | Status: DC
  Administered 2021-07-23: 2.5 mg via ORAL
  Filled 2021-07-23: qty 1

## 2021-07-23 MED ORDER — IOHEXOL 300 MG/ML  SOLN
100.0000 mL | Freq: Once | INTRAMUSCULAR | Status: AC | PRN
  Administered 2021-07-23: 100 mL via INTRAVENOUS

## 2021-07-23 MED ORDER — ONDANSETRON HCL 4 MG PO TABS: 4.0000 mg | ORAL_TABLET | Freq: Four times a day (QID) | ORAL | Status: DC | PRN

## 2021-07-23 MED ORDER — WARFARIN - PHARMACIST DOSING INPATIENT
Freq: Every day | Status: DC
  Filled 2021-07-23: qty 1

## 2021-07-23 NOTE — ED Notes (Signed)
Urine specimen obtained and sent to lab

## 2021-07-23 NOTE — [place] Notes (Signed)
ANTICOAGULATION CONSULT NOTE - Initial Consult ? ?Pharmacy Consult for Warfarin  ?Indication: atrial fibrillation ? ?Allergies  ?Allergen Reactions  ? Amoxicillin Swelling  ?  Oral swelling  ? Ampicillin Swelling  ?  Oral swelling  ? ? ?Patient Measurements: ?Height: '5\' 8"'$  (172.7 cm) ?Weight: 81.2 kg (179 lb) ?IBW/kg (Calculated) : 63.9 ?Heparin Dosing Weight:  ? ?Vital Signs: ?Temp: 97.7 ?F (36.5 ?C) (03/18 2009) ?Temp Source: Oral (03/18 2009) ?BP: 122/66 (03/19 0300) ?Pulse Rate: 86 (03/19 0300) ? ?Labs: ?Recent Labs  ?  07/22/21 ?2023 07/23/21 ?0229  ?HGB 15.9*  --   ?HCT 49.2*  --   ?PLT 237  --   ?LABPROT  --  16.9*  ?INR  --  1.4*  ?CREATININE 1.09*  --   ? ? ?Estimated Creatinine Clearance: 48.3 mL/min (A) (by C-G formula based on SCr of 1.09 mg/dL (H)). ? ? ?Medical History: ?Past Medical History:  ?Diagnosis Date  ? Allergic rhinitis   ? Aortic atherosclerosis ([place])   ? Atrial fibrillation ([place])   ? Breast cancer, right ([place])   ? CHF (congestive heart failure) ([place])   ? CKD (chronic kidney disease)   ? Current use of long term anticoagulation   ? Warfarin  ? Depression   ? History of kidney stones   ? Hyperlipemia   ? Osteoarthritis   ? Stroke [facility]) 2011  ? ? ?Medications:  ?(Not in a hospital admission)  ? ?Assessment: ?Pharmacy consulted to dose warfarin for Afib in this 76 year old female admitted with intractable N/V.  Pt was on warfarin 2.5 mg PO daily. ?Last dose on 3/18 AM. ? ?3/19: INR @ 0229 = 1.4, subtherapeutic  ? ?Goal of Therapy:  ?Heparin level 0.3-0.7 units/ml ?Monitor platelets by anticoagulation protocol: Yes ?  ?Plan:  ?Will order warfarin 4 mg PO X 1 for 3/19 @ 0400 due to subtherapeutic INR. ? ?Will reorder home warfarin dose of 2.5 mg PO daily. ? ?Will recheck INR on 3/20 with AM labs.  ? ?Tia Hieronymus D ?07/23/2021,4:00 AM ? ? ?

## 2021-07-23 NOTE — Final [place] Note (Signed)
Same day rounding progress note ? ?Patient seen and examined while in the ED.  Waiting for floor bed.  Please see Dr. Josefine Class dictated history and physical for further details.  I agree with her assessment and plan. ? ?Intractable nausea and vomiting ?Has advance her diet to soft diet as she is not having any further vomiting although she is very nauseous ?Performed chest x-ray as she was at which shows no acute abnormality ? ?Time spent: 20 minutes ?

## 2021-07-23 NOTE — ED Notes (Addendum)
Pt concerned about purwick- checked placement and surrounding linens, no leaking noted, and urine noted in canister- reassured pt all was well with purwick ?

## 2021-07-23 NOTE — ED Notes (Signed)
Pt complaining of productive cough with yellow sputum- Dr Manuella Ghazi notified via secure chat ?

## 2021-07-23 NOTE — [place] Notes (Signed)
Contacted niece to let her know patient was admitted to hospital.  ? ?Fuller Mandril, RN ? ?

## 2021-07-24 DIAGNOSIS — I482 Chronic atrial fibrillation, unspecified: Secondary | ICD-10-CM

## 2021-07-24 DIAGNOSIS — E871 Hypo-osmolality and hyponatremia: Secondary | ICD-10-CM | POA: Diagnosis not present

## 2021-07-24 DIAGNOSIS — Z9889 Other specified postprocedural states: Secondary | ICD-10-CM | POA: Diagnosis not present

## 2021-07-24 DIAGNOSIS — R112 Nausea with vomiting, unspecified: Secondary | ICD-10-CM | POA: Diagnosis not present

## 2021-07-24 LAB — PROTIME-INR
INR: 1.9 — ABNORMAL HIGH (ref 0.8–1.2)
Prothrombin Time: 22.1 seconds — ABNORMAL HIGH (ref 11.4–15.2)

## 2021-07-24 LAB — CBC
HCT: 35.2 % — ABNORMAL LOW (ref 36.0–46.0)
Hemoglobin: 11.6 g/dL — ABNORMAL LOW (ref 12.0–15.0)
MCH: 30.4 pg (ref 26.0–34.0)
MCHC: 33 g/dL (ref 30.0–36.0)
MCV: 92.1 fL (ref 80.0–100.0)
Platelets: 193 10*3/uL (ref 150–400)
RBC: 3.82 MIL/uL — ABNORMAL LOW (ref 3.87–5.11)
RDW: 16.8 % — ABNORMAL HIGH (ref 11.5–15.5)
WBC: 8.7 10*3/uL (ref 4.0–10.5)
nRBC: 0 % (ref 0.0–0.2)

## 2021-07-24 MED ORDER — ALBUTEROL SULFATE (2.5 MG/3ML) 0.083% IN NEBU
2.5000 mg | INHALATION_SOLUTION | Freq: Once | RESPIRATORY_TRACT | Status: AC
  Administered 2021-07-24: 2.5 mg via RESPIRATORY_TRACT

## 2021-07-24 MED ORDER — DOXYCYCLINE HYCLATE 100 MG PO TBEC
100.0000 mg | DELAYED_RELEASE_TABLET | Freq: Two times a day (BID) | ORAL | 0 refills | Status: AC
Start: 2021-07-24 — End: 2021-07-27

## 2021-07-24 NOTE — [place] Notes (Signed)
ANTICOAGULATION CONSULT NOTE - Initial Consult ? ?Pharmacy Consult for Warfarin  ?Indication: atrial fibrillation ? ?Allergies  ?Allergen Reactions  ? Amoxicillin Swelling  ?  Oral swelling  ? Ampicillin Swelling  ?  Oral swelling  ? ? ?Patient Measurements: ?Height: '5\' 8"'$  (172.7 cm) ?Weight: 81.2 kg (179 lb) ?IBW/kg (Calculated) : 63.9 ?Heparin Dosing Weight:  ? ?Vital Signs: ?Temp: 98.2 ?F (36.8 ?C) (03/20 0217) ?BP: 115/48 (03/20 0217) ?Pulse Rate: 81 (03/20 0217) ? ?Labs: ?Recent Labs  ?  07/22/21 ?2023 07/23/21 ?0229 07/23/21 ?0500 07/24/21 ?0413  ?HGB 15.9*  --  13.5 11.6*  ?HCT 49.2*  --  40.5 35.2*  ?PLT 237  --  248 193  ?LABPROT  --  16.9*  --  22.1*  ?INR  --  1.4*  --  1.9*  ?CREATININE 1.09*  --  1.00  --   ? ? ? ?Estimated Creatinine Clearance: 52.7 mL/min (by C-G formula based on SCr of 1 mg/dL). ? ? ?Medical History: ?Past Medical History:  ?Diagnosis Date  ? Allergic rhinitis   ? Aortic atherosclerosis ([place])   ? Atrial fibrillation ([place])   ? Breast cancer, right ([place])   ? CHF (congestive heart failure) ([place])   ? CKD (chronic kidney disease)   ? Current use of long term anticoagulation   ? Warfarin  ? Depression   ? History of kidney stones   ? Hyperlipemia   ? Osteoarthritis   ? Stroke [facility]) 2011  ? ? ?Medications:  ?Medications Prior to Admission  ?Medication Sig Dispense Refill Last Dose  ? acetaminophen (TYLENOL) 500 MG tablet Take 2 tablets (1,000 mg total) by mouth every 8 (eight) hours as needed for moderate pain or headache. 30 tablet 0 prn at prn  ? acetaminophen (TYLENOL) 500 MG tablet Take 1,000 mg by mouth 2 (two) times daily.   07/22/2021 at 0900  ? buPROPion (WELLBUTRIN XL) 300 MG 24 hr tablet Take 300 mg by mouth daily.   07/22/2021 at 0900  ? cetirizine (ZYRTEC) 10 MG tablet Take 10 mg by mouth daily.   07/22/2021 at 0900  ? Cholecalciferol 125 MCG (5000 UT) capsule Take 5,000 Units by mouth daily.   07/22/2021 at 0900  ? famotidine (PEPCID) 10 MG tablet Take 1 tablet (10 mg total) by  mouth at bedtime.   07/21/2021 at 2000  ? fluticasone (FLONASE) 50 MCG/ACT nasal spray Place 1 spray into both nostrils daily.   07/22/2021 at 0900  ? metoprolol tartrate (LOPRESSOR) 25 MG tablet Take 0.5 tablets (12.5 mg total) by mouth 2 (two) times daily.   07/22/2021 at 0900  ? midodrine (PROAMATINE) 5 MG tablet Take 5 mg by mouth 3 (three) times daily.   07/22/2021 at 1400  ? MULTIPLE VITAMIN PO Take 1 tablet by mouth daily.   07/22/2021 at 0900  ? omeprazole (PRILOSEC) 20 MG capsule Take 20 mg by mouth daily.   07/22/2021 at 0600  ? polyethylene glycol (MIRALAX / GLYCOLAX) 17 g packet Take 34 g by mouth daily as needed.  0 prn at prn  ? potassium citrate (UROCIT-K) 10 MEQ (1080 MG) SR tablet Take 10 mEq by mouth 2 (two) times daily.   07/22/2021 at 0800  ? PROAIR HFA 108 (90 Base) MCG/ACT inhaler Inhale 2 puffs into the lungs every 4 (four) hours as needed.   prn at prn  ? selenium sulfide (SELSUN) 1 % LOTN Apply 1 application. topically as directed. Every Monday and Friday for dry scalp.  at 0700  ? simvastatin (ZOCOR) 20 MG tablet Take 20 mg by mouth at bedtime.   07/21/2021 at 2000  ? sodium chloride (OCEAN) 0.65 % SOLN nasal spray Place 2 sprays into both nostrils as needed for congestion.   prn at prn  ? tamsulosin (FLOMAX) 0.4 MG CAPS capsule Take 1 capsule (0.4 mg total) by mouth daily. 30 capsule  07/22/2021 at 0900  ? torsemide (DEMADEX) 20 MG tablet Take 20 mg by mouth daily.   07/22/2021 at 0900  ? vitamin B-12 (CYANOCOBALAMIN) 250 MCG tablet Take 250 mcg by mouth daily.   07/22/2021 at 0900  ? warfarin (COUMADIN) 2.5 MG tablet Take 2.5 mg by mouth daily.   07/21/2021 at 1800  ? zolpidem (AMBIEN) 5 MG tablet Take 0.5 tablets (2.5 mg total) by mouth at bedtime as needed for sleep. 30 tablet 0 07/22/2021 at 0000  ? ? ?Assessment: ?Pharmacy consulted to dose warfarin for Afib in this 76 year old female admitted with intractable N/V.  Pt was on warfarin 2.5 mg PO daily. ?Last dose on 3/18 AM. ? ?3/17: PTA Last  dose of warfarin 2.'5mg'$  (3/17 '@1800'$ ) ?3/18: --- ('0mg'$ ) ?3/19: INR @ 0230 = 1.4, subtherapeutic ('4mg'$  x1 3/19 0400) (2.'5mg'$  x1 3/19 1530) ?3/19: INR '@0415'$  = 1.9,  ? ?Goal of Therapy:  ?Heparin level 0.3-0.7 units/ml ?Monitor platelets by anticoagulation protocol: Yes ?  ?Plan:  ?No warfarin received on 3/18, 3/19 received a loading dose of '4mg'$  x1, followed by home maintenance dose of 2.'5mg'$  x1 in evening. INR rose 1.4>1.9 (delta 0.5), rapid rise for single day, but given no warfarin on 3/18 anticipate early INR plateau. ?Will continue with warfarin 2.'5mg'$  PO for 3/20 (PTA was 2.'5mg'$  QD) ?Will recheck INR daily with AM labs.  ? ?Lorna Dibble, PharmD, BCCP ?Clinical Pharmacist ?07/24/2021 9:01 AM ? ? ? ?

## 2021-07-24 NOTE — Plan of Care (Signed)
?  Problem: Safety: ?Goal: Ability to remain free from injury will improve ?Outcome: Not Progressing ?  ?Problem: Elimination: ?Goal: Will not experience complications related to bowel motility ?Outcome: Not Progressing ?  ?Problem: Elimination: ?Goal: Will not experience complications related to urinary retention ?Outcome: Not Progressing ?  ?Problem: Skin Integrity: ?Goal: Risk for impaired skin integrity will decrease ?Outcome: Not Progressing ?  ?

## 2021-07-24 NOTE — TOC Initial Note (Signed)
Transition of Care (TOC) - Initial/Assessment Note  ? ? ?Patient Details  ?Name: Jessica Long ?MRN: [id number] ?Date of Birth: [birth year] ? ?Transition of Care (TOC) CM/SW Contact:    ?Beverly Sessions, RN ?Phone Number: ?07/24/2021, 10:19 AM ? ?Clinical Narrative:                 ? ?Admitted for:n/v/d ?Admitted from: [facility] ?PCP: [facility] ?Current home health/prior home health/DME:[facility] ? ?Patient to discharge today.  Niece June notified ?- notified Tiffany at [facility], [employer] summary and fl2 faxed ?- [facility] to provide transport at approximately 10 am ? ?Expected Discharge Plan: Assisted Living ?Barriers to Discharge: Barriers Resolved ? ? ?Patient Goals and CMS Choice ?Patient states their goals for this hospitalization and ongoing recovery are:: to return to assisted living ?  ?  ? ?Expected Discharge Plan and Services ?Expected Discharge Plan: Assisted Living ?  ?Discharge Planning Services: CM Consult ?  ?Living arrangements for the past 2 months: [place] ?Expected Discharge Date: 07/24/21               ?  ?  ?  ?  ?  ?  ?  ?  ?  ?  ? ?Prior Living Arrangements/Services ?Living arrangements for the past 2 months: [place] ?Lives with:: Facility Resident ?  ?Do you feel safe going back to the place where you live?: Yes      ?Need for Family Participation in Patient Care: Yes (Comment) ?Care giver support system in place?: Yes (comment) ?Current home services: DME ?Criminal Activity/Legal Involvement Pertinent to Current Situation/Hospitalization: No - Comment as needed ? ?Activities of Daily Living ?Home Assistive Devices/Equipment: Shower chair with back ?ADL Screening (condition at time of admission) ?Patient's cognitive ability adequate to safely complete daily activities?: Yes ?Is the patient deaf or have difficulty hearing?: No ?Does the patient have difficulty seeing, even when wearing glasses/contacts?: No ?Does the patient have difficulty  concentrating, remembering, or making decisions?: No ?Patient able to express need for assistance with ADLs?: Yes ?Does the patient have difficulty dressing or bathing?: No ?Independently performs ADLs?: Yes (appropriate for developmental age) ?Does the patient have difficulty walking or climbing stairs?: No ?Weakness of Legs: Both ?Weakness of Arms/Hands: Both ? ?Permission Sought/Granted ?  ?  ?   ?   ?   ?   ? ?Emotional Assessment ?Appearance:: Appears stated age ?  ?  ?Orientation: : Oriented to Self, Oriented to Place, Oriented to  Time, Oriented to Situation ?Alcohol / Substance Use: Not Applicable ?Psych Involvement: No (comment) ? ?Admission diagnosis:  Intractable vomiting [R11.10] ?Nausea vomiting and diarrhea [R11.2, R19.7] ?Patient Active Problem List  ? Diagnosis Date Noted  ? Intractable vomiting 07/23/2021  ? Stage 3a chronic kidney disease ([place]) 07/22/2021  ? Current use of long term anticoagulation   ? History of CVA (cerebrovascular accident)   ? History of lithotripsy   ? Chronic hyponatremia   ? Nausea vomiting and diarrhea   ? 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 Hydronephrosis 09/03/2020  ? AKI (acute kidney injury) ([place]) 08/31/2020  ? COPD (chronic obstructive pulmonary disease) with emphysema ([place]) 08/31/2020  ? Shortness of breath 08/31/2020  ? Atrial fibrillation ([place])   ? Depression   ? ?PCP:  [facility] ?Pharmacy:   ?[facility] [address] ?[place] [address] ?[address] ?[place] [address] ?Phone: [phone] Fax: [phone] ? ? ? ? ?Social Determinants of  Health (SDOH) Interventions ?  ? ?Readmission Risk Interventions ?No flowsheet data found. ? ? ?

## 2021-07-24 NOTE — NC FL2 (Signed)
?[place] MEDICAID FL2 LEVEL OF CARE SCREENING TOOL  ?  ? ?IDENTIFICATION  ?Patient Name: ?Jessica Long Birthdate: 06/16/1945 Sex: female Admission Date (Current Location): ?07/22/2021  ?[place] and [place] Number: ? [place] ?  Facility and Address:  ?  ?     Provider Number: ?[id number]  ?Attending Physician Name and Address:  ?Max Sane, MD ? Relative Name and Phone Number:  ?  ?   ?Current Level of Care: ?Hospital Recommended Level of Care: ?Assisted Living Facility Prior Approval Number: ?  ? ?Date Approved/Denied: ?  PASRR Number: ?  ? ?Discharge Plan: ?Other (Comment) (ALF) ?  ? ?Current Diagnoses: ?Patient Active Problem List  ? Diagnosis Date Noted  ? Intractable vomiting 07/23/2021  ? Stage 3a chronic kidney disease ([place]) 07/22/2021  ? Current use of long term anticoagulation   ? History of CVA (cerebrovascular accident)   ? History of lithotripsy   ? Chronic hyponatremia   ? Nausea vomiting and diarrhea   ? 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 Hydronephrosis 09/03/2020  ? AKI (acute kidney injury) ([place]) 08/31/2020  ? COPD (chronic obstructive pulmonary disease) with emphysema ([place]) 08/31/2020  ? Shortness of breath 08/31/2020  ? Atrial fibrillation ([place])   ? Depression   ? ? ?Orientation RESPIRATION BLADDER Height & Weight   ?  ?Self, Time, Situation, Place ? Normal Continent Weight: 81.2 kg ?Height:  '5\' 8"'$  (172.7 cm)  ?BEHAVIORAL SYMPTOMS/MOOD NEUROLOGICAL BOWEL NUTRITION STATUS  ?    Continent Diet (Soft diet)  ?AMBULATORY STATUS COMMUNICATION OF NEEDS Skin   ?Limited Assist Verbally Bruising ?  ?  ?  ?    ?     ?     ? ? ?Personal Care Assistance Level of Assistance  ?    ?  ?  ?   ? ?Functional Limitations Info  ?    ?  ?   ? ? ?SPECIAL CARE FACTORS FREQUENCY  ?    ?  ?  ?  ?  ?  ?  ?   ? ? ?Contractures Contractures Info: Not present  ? ? ?Additional Factors Info  ?Code Status, Allergies Code Status Info: full code ?Allergies Info: Amoxicillin, Ampicillin ?  ?  ?  ?   ? ?Medication List  ?    ?  ?TAKE these medications   ?  ?acetaminophen 500 MG tablet ?Commonly known as: TYLENOL ?Take 1,000 mg by mouth 2 (two) times daily. ?   ?acetaminophen 500 MG tablet ?Commonly known as: TYLENOL ?Take 2 tablets (1,000 mg total) by mouth every 8 (eight) hours as needed for moderate pain or headache. ?   ?buPROPion 300 MG 24 hr tablet ?Commonly known as: WELLBUTRIN XL ?Take 300 mg by mouth daily. ?   ?cetirizine 10 MG tablet ?Commonly known as: ZYRTEC ?Take 10 mg by mouth daily. ?   ?Cholecalciferol 125 MCG (5000 UT) capsule ?Take 5,000 Units by mouth daily. ?   ?doxycycline 100 MG EC tablet ?Commonly known as: DORYX ?Take 1 tablet (100 mg total) by mouth 2 (two) times daily for 3 days. ?   ?famotidine 10 MG tablet ?Commonly known as: PEPCID ?Take 1 tablet (10 mg total) by mouth at bedtime. ?   ?fluticasone 50 MCG/ACT nasal spray ?Commonly known as: FLONASE ?Place 1 spray into both nostrils daily. ?   ?metoprolol tartrate 25 MG tablet ?Commonly known as: LOPRESSOR ?Take 0.5 tablets (12.5 mg total) by mouth 2 (two) times daily. ?   ?midodrine 5 MG tablet ?  Commonly known as: PROAMATINE ?Take 5 mg by mouth 3 (three) times daily. ?   ?MULTIPLE VITAMIN PO ?Take 1 tablet by mouth daily. ?   ?omeprazole 20 MG capsule ?Commonly known as: PRILOSEC ?Take 20 mg by mouth daily. ?   ?polyethylene glycol 17 g packet ?Commonly known as: MIRALAX / GLYCOLAX ?Take 34 g by mouth daily as needed. ?   ?potassium citrate 10 MEQ (1080 MG) SR tablet ?Commonly known as: UROCIT-K ?Take 10 mEq by mouth 2 (two) times daily. ?   ?ProAir HFA 108 (90 Base) MCG/ACT inhaler ?Generic drug: albuterol ?Inhale 2 puffs into the lungs every 4 (four) hours as needed. ?   ?selenium sulfide 1 % Lotn ?Commonly known as: SELSUN ?Apply 1 application. topically as directed. Every Monday and Friday for dry scalp. ?   ?simvastatin 20 MG tablet ?Commonly known as: ZOCOR ?Take 20 mg by mouth at bedtime. ?   ?sodium chloride 0.65 % Soln nasal spray ?Commonly known  as: OCEAN ?Place 2 sprays into both nostrils as needed for congestion. ?   ?tamsulosin 0.4 MG Caps capsule ?Commonly known as: FLOMAX ?Take 1 capsule (0.4 mg total) by mouth daily. ?   ?torsemide 20 MG tablet ?Commonly known as: DEMADEX ?Take 20 mg by mouth daily. ?   ?vitamin B-12 250 MCG tablet ?Commonly known as: CYANOCOBALAMIN ?Take 250 mcg by mouth daily. ?   ?warfarin 2.5 MG tablet ?Commonly known as: COUMADIN ?Take 2.5 mg by mouth daily. ?   ?zolpidem 5 MG tablet ?Commonly known as: AMBIEN ?Take 0.5 tablets (2.5 mg total) by mouth at bedtime as needed for sleep. ?   ?  ?Relevant Imaging Results: ? ?Relevant Lab Results: ? ? ?Additional Information ?SS #: [phone] ? ?Beverly Sessions, RN ? ? ? ? ?

## 2021-07-24 NOTE — Discharge Summary (Signed)
?Physician Discharge Summary ?  ?Patient: Jessica Long MRN: [id number] DOB: 08/08/1945  ?Admit date:     07/22/2021  ?Discharge date: 07/24/21  ?Discharge Physician: Max Sane  ? ?PCP: [facility]  ? ?Recommendations at discharge:  ? ? F/up with outpt providers as requested ? ?Discharge Diagnoses: ?Principal Problem: ?  Nausea vomiting and diarrhea ?Active Problems: ?  History of lithotripsy ?  Chronic hyponatremia ?  Atrial fibrillation ([place]) ?  Current use of long term anticoagulation ?  COPD (chronic obstructive pulmonary disease) with emphysema ([place]) ?  Stage 3a chronic kidney disease ([place]) ?  History of CVA (cerebrovascular accident) ?  Intractable vomiting ? ? ?Assessment and Plan: ?* Nausea vomiting and diarrhea ?likely acute viral gastroenteritis ?CT abdomen and pelvis with staghorn calculus and moderate hiatal hernia. No further N/V/Diarrhea. She is tolerating diet. ? ?Acute on Chronic hyponatremia ?Suspect hypovolemia due to N/V/Diarrhea.  ?Na 133 after hydration. ? ?History of lithotripsy ?History of septic shock secondary to obstructing ureteral calculi with stent placement ? ?Current use of long term anticoagulation ?INR 1.9. Recommend recheck on 3/22 and 3/24 with results to PCP for warfarin dose adjustment ? ?Atrial fibrillation ([place]) ?Rate controlled with metoprolol and Coumadin for anticoagulation ? ?History of CVA (cerebrovascular accident) ?No acute disease suspected ?Continue statin ?Ambulates with walker at baseline ? ?Stage 3a chronic kidney disease ([place]) ?Renal function at baseline ? ?COPD (chronic obstructive pulmonary disease) with emphysema ([place]) ?Not acutely exacerbated ? ? ?  ? ? ?Disposition:  [place] ?Diet recommendation:  ?Discharge Diet Orders (From admission, onward)  ? ?  Start     Ordered  ? 07/24/21 0000  Diet - low sodium heart healthy       ? 07/24/21 3825  ? ?  ?  ? ?  ? ?Carb modified diet ?DISCHARGE MEDICATION: ?Allergies as of 07/24/2021   ? ?   Reactions  ?  Amoxicillin Swelling  ? Oral swelling  ? Ampicillin Swelling  ? Oral swelling  ? ?  ? ?  ?Medication List  ?  ? ?TAKE these medications   ? ?acetaminophen 500 MG tablet ?Commonly known as: TYLENOL ?Take 1,000 mg by mouth 2 (two) times daily. ?  ?acetaminophen 500 MG tablet ?Commonly known as: TYLENOL ?Take 2 tablets (1,000 mg total) by mouth every 8 (eight) hours as needed for moderate pain or headache. ?  ?buPROPion 300 MG 24 hr tablet ?Commonly known as: WELLBUTRIN XL ?Take 300 mg by mouth daily. ?  ?cetirizine 10 MG tablet ?Commonly known as: ZYRTEC ?Take 10 mg by mouth daily. ?  ?Cholecalciferol 125 MCG (5000 UT) capsule ?Take 5,000 Units by mouth daily. ?  ?doxycycline 100 MG EC tablet ?Commonly known as: DORYX ?Take 1 tablet (100 mg total) by mouth 2 (two) times daily for 3 days. ?  ?famotidine 10 MG tablet ?Commonly known as: PEPCID ?Take 1 tablet (10 mg total) by mouth at bedtime. ?  ?fluticasone 50 MCG/ACT nasal spray ?Commonly known as: FLONASE ?Place 1 spray into both nostrils daily. ?  ?metoprolol tartrate 25 MG tablet ?Commonly known as: LOPRESSOR ?Take 0.5 tablets (12.5 mg total) by mouth 2 (two) times daily. ?  ?midodrine 5 MG tablet ?Commonly known as: PROAMATINE ?Take 5 mg by mouth 3 (three) times daily. ?  ?MULTIPLE VITAMIN PO ?Take 1 tablet by mouth daily. ?  ?omeprazole 20 MG capsule ?Commonly known as: PRILOSEC ?Take 20 mg by mouth daily. ?  ?polyethylene glycol 17 g packet ?Commonly known as:  MIRALAX / GLYCOLAX ?Take 34 g by mouth daily as needed. ?  ?potassium citrate 10 MEQ (1080 MG) SR tablet ?Commonly known as: UROCIT-K ?Take 10 mEq by mouth 2 (two) times daily. ?  ?ProAir HFA 108 (90 Base) MCG/ACT inhaler ?Generic drug: albuterol ?Inhale 2 puffs into the lungs every 4 (four) hours as needed. ?  ?selenium sulfide 1 % Lotn ?Commonly known as: SELSUN ?Apply 1 application. topically as directed. Every Monday and Friday for dry scalp. ?  ?simvastatin 20 MG tablet ?Commonly known as: ZOCOR ?Take  20 mg by mouth at bedtime. ?  ?sodium chloride 0.65 % Soln nasal spray ?Commonly known as: OCEAN ?Place 2 sprays into both nostrils as needed for congestion. ?  ?tamsulosin 0.4 MG Caps capsule ?Commonly known as: FLOMAX ?Take 1 capsule (0.4 mg total) by mouth daily. ?  ?torsemide 20 MG tablet ?Commonly known as: DEMADEX ?Take 20 mg by mouth daily. ?  ?vitamin B-12 250 MCG tablet ?Commonly known as: CYANOCOBALAMIN ?Take 250 mcg by mouth daily. ?  ?warfarin 2.5 MG tablet ?Commonly known as: COUMADIN ?Take 2.5 mg by mouth daily. ?  ?zolpidem 5 MG tablet ?Commonly known as: AMBIEN ?Take 0.5 tablets (2.5 mg total) by mouth at bedtime as needed for sleep. ?  ? ?  ? ? Follow-up Information   ? ? [facility]. Schedule an appointment as soon as possible for a visit in 2 day(s).   ?Specialty: Geriatric Medicine ?Why: [facility] Discharge F/UP ?Contact information: ?[address] ?[address] ?[place] [address] ?[phone] ? ? ?  ?  ? ?  ?  ? ?  ? ?Discharge Exam: ?Danley Danker Weights  ? 07/22/21 2009  ?Weight: 81.2 kg  ? ?Constitutional:   ?   General: She is not in acute distress. ?   Appearance: Normal appearance.  ?HENT:  ?   Head: Normocephalic and atraumatic.  ?Cardiovascular:  ?   Rate and Rhythm: Normal rate and regular rhythm.  ?   Pulses: Normal pulses.  ?   Heart sounds: Normal heart sounds. No murmur heard. ?Pulmonary:  ?   Effort: Pulmonary effort is normal.  ?   Breath sounds: Normal breath sounds. No wheezing or rhonchi.  ?Abdominal:  ?   General: Bowel sounds are normal.  ?   Palpations: Abdomen is soft.  ?   Tenderness: There is no abdominal tenderness.  ?Musculoskeletal:     ?   General: No swelling or tenderness. Normal range of motion.  ?   Cervical back: Normal range of motion and neck supple.  ?Skin: ?   General: Skin is warm and dry.  ?Neurological:  ?   Mental Status: She is alert. Mental status is at baseline.  ? ?Condition at discharge: fair ? ?The results of significant diagnostics  from this hospitalization (including imaging, microbiology, ancillary and laboratory) are listed below for reference.  ? ?Imaging Studies: ?CT ABDOMEN PELVIS W CONTRAST ? ?Result Date: 07/23/2021 ?CLINICAL DATA:  Nausea/vomiting EXAM: CT ABDOMEN AND PELVIS WITH CONTRAST TECHNIQUE: Multidetector CT imaging of the abdomen and pelvis was performed using the standard protocol following bolus administration of intravenous contrast. RADIATION DOSE REDUCTION: This exam was performed according to the departmental dose-optimization program which includes automated exposure control, adjustment of the mA and/or kV according to patient size and/or use of iterative reconstruction technique. CONTRAST:  139m OMNIPAQUE IOHEXOL 300 MG/ML  SOLN COMPARISON:  09/02/2020 FINDINGS: Lower chest: Subpleural reticulation in the bilateral lung bases, favoring post infectious/inflammatory scarring.  Hepatobiliary: 2.0 cm cyst in the lateral segment left hepatic lobe (series 2/image 21). Gallbladder is unremarkable. No intrahepatic or extrahepatic ductal dilatation. Pancreas: Within normal limits. Spleen: Within normal limits. Adrenals/Urinary Tract: Adrenal glands are within normal limits. Left lower pole partial staghorn calculus measuring at least 16 mm (coronal image 45). Left renal parenchymal atrophy, new/progressive from the prior. Right kidney is within normal limits. No hydronephrosis. Bladder is within normal limits. Stomach/Bowel: Stomach is notable for a moderate hiatal hernia. No evidence of bowel obstruction. Normal appendix (series 2/image 59). No colonic wall thickening or inflammatory changes. Vascular/Lymphatic: No evidence of abdominal aortic aneurysm. Atherosclerotic calcifications of the abdominal aorta and branch vessels. No suspicious abdominopelvic lymphadenopathy. Reproductive: Uterus is notable for a dominant 3.5 cm intramural fundal fibroid (series 2/image 69). No adnexal masses. Other: No abdominopelvic ascites.  Musculoskeletal: Degenerative changes of the lumbar spine. IMPRESSION: No evidence of bowel obstruction.  Normal appendix. Moderate hiatal hernia. 16 mm left lower pole partial staghorn calculus.  No hy

## 2021-07-24 NOTE — Care Management Obs Status (Signed)
MEDICARE OBSERVATION STATUS NOTIFICATION ? ? ?Patient Details  ?Name: Jessica Long ?MRN: [id number] ?Date of Birth: 1945/07/16 ? ? ?Medicare Observation Status Notification Given:  Yes ? ? ? ?Beverly Sessions, RN ?07/24/2021, 10:16 AM ?

## 2021-07-29 ENCOUNTER — Other Ambulatory Visit: Payer: Self-pay

## 2021-07-29 ENCOUNTER — Inpatient Hospital Stay
Admission: EM | Admit: 2021-07-29 | Discharge: 2021-08-01 | DRG: 522 | Disposition: A | Discharge status: Skilled Nursing Facility | Payer: Medicare Other | Source: Skilled Nursing Facility | Attending: Internal Medicine | Admitting: Internal Medicine

## 2021-07-29 ENCOUNTER — Emergency Department: Payer: Medicare Other

## 2021-07-29 DIAGNOSIS — I700 Atherosclerosis of aorta: Secondary | ICD-10-CM | POA: Diagnosis present

## 2021-07-29 DIAGNOSIS — Z8673 Personal history of transient ischemic attack (TIA), and cerebral infarction without residual deficits: Secondary | ICD-10-CM | POA: Diagnosis not present

## 2021-07-29 DIAGNOSIS — I482 Chronic atrial fibrillation, unspecified: Secondary | ICD-10-CM | POA: Diagnosis present

## 2021-07-29 DIAGNOSIS — M25552 Pain in left hip: Secondary | ICD-10-CM

## 2021-07-29 DIAGNOSIS — J439 Emphysema, unspecified: Secondary | ICD-10-CM | POA: Diagnosis present

## 2021-07-29 DIAGNOSIS — Z880 Allergy status to penicillin: Secondary | ICD-10-CM

## 2021-07-29 DIAGNOSIS — E871 Hypo-osmolality and hyponatremia: Secondary | ICD-10-CM

## 2021-07-29 DIAGNOSIS — Z825 Family history of asthma and other chronic lower respiratory diseases: Secondary | ICD-10-CM | POA: Diagnosis not present

## 2021-07-29 DIAGNOSIS — Z20822 Contact with and (suspected) exposure to covid-19: Secondary | ICD-10-CM | POA: Diagnosis present

## 2021-07-29 DIAGNOSIS — Z87891 Personal history of nicotine dependence: Secondary | ICD-10-CM | POA: Diagnosis not present

## 2021-07-29 DIAGNOSIS — Z8249 Family history of ischemic heart disease and other diseases of the circulatory system: Secondary | ICD-10-CM | POA: Diagnosis not present

## 2021-07-29 DIAGNOSIS — Z853 Personal history of malignant neoplasm of breast: Secondary | ICD-10-CM

## 2021-07-29 DIAGNOSIS — W010XXA Fall on same level from slipping, tripping and stumbling without subsequent striking against object, initial encounter: Secondary | ICD-10-CM | POA: Diagnosis present

## 2021-07-29 DIAGNOSIS — I5032 Chronic diastolic (congestive) heart failure: Secondary | ICD-10-CM | POA: Diagnosis present

## 2021-07-29 DIAGNOSIS — Z9011 Acquired absence of right breast and nipple: Secondary | ICD-10-CM

## 2021-07-29 DIAGNOSIS — F32A Depression, unspecified: Secondary | ICD-10-CM | POA: Diagnosis present

## 2021-07-29 DIAGNOSIS — M25512 Pain in left shoulder: Secondary | ICD-10-CM | POA: Diagnosis present

## 2021-07-29 DIAGNOSIS — Z820 Family history of epilepsy and other diseases of the nervous system: Secondary | ICD-10-CM

## 2021-07-29 DIAGNOSIS — Y92099 Unspecified place in other non-institutional residence as the place of occurrence of the external cause: Secondary | ICD-10-CM

## 2021-07-29 DIAGNOSIS — S72002A Fracture of unspecified part of neck of left femur, initial encounter for closed fracture: Secondary | ICD-10-CM

## 2021-07-29 DIAGNOSIS — W19XXXA Unspecified fall, initial encounter: Secondary | ICD-10-CM

## 2021-07-29 DIAGNOSIS — S72012A Unspecified intracapsular fracture of left femur, initial encounter for closed fracture: Secondary | ICD-10-CM | POA: Diagnosis present

## 2021-07-29 DIAGNOSIS — R791 Abnormal coagulation profile: Secondary | ICD-10-CM | POA: Diagnosis present

## 2021-07-29 DIAGNOSIS — E785 Hyperlipidemia, unspecified: Secondary | ICD-10-CM | POA: Diagnosis present

## 2021-07-29 DIAGNOSIS — N179 Acute kidney failure, unspecified: Secondary | ICD-10-CM | POA: Diagnosis present

## 2021-07-29 DIAGNOSIS — Z87442 Personal history of urinary calculi: Secondary | ICD-10-CM

## 2021-07-29 DIAGNOSIS — Z7901 Long term (current) use of anticoagulants: Secondary | ICD-10-CM

## 2021-07-29 DIAGNOSIS — Z79899 Other long term (current) drug therapy: Secondary | ICD-10-CM

## 2021-07-29 DIAGNOSIS — N1831 Chronic kidney disease, stage 3a: Secondary | ICD-10-CM

## 2021-07-29 DIAGNOSIS — I130 Hypertensive heart and chronic kidney disease with heart failure and stage 1 through stage 4 chronic kidney disease, or unspecified chronic kidney disease: Secondary | ICD-10-CM | POA: Diagnosis present

## 2021-07-29 DIAGNOSIS — J309 Allergic rhinitis, unspecified: Secondary | ICD-10-CM | POA: Diagnosis present

## 2021-07-29 LAB — COMPREHENSIVE METABOLIC PANEL
ALT: 27 U/L (ref 0–44)
AST: 26 U/L (ref 15–41)
Albumin: 3.6 g/dL (ref 3.5–5.0)
Alkaline Phosphatase: 49 U/L (ref 38–126)
Anion gap: 11 (ref 5–15)
BUN: 14 mg/dL (ref 8–23)
CO2: 25 mmol/L (ref 22–32)
Calcium: 8.9 mg/dL (ref 8.9–10.3)
Chloride: 93 mmol/L — ABNORMAL LOW (ref 98–111)
Creatinine, Ser: 1.38 mg/dL — ABNORMAL HIGH (ref 0.44–1.00)
GFR, Estimated: 39 mL/min — ABNORMAL LOW (ref >60)
Glucose, Bld: 116 mg/dL — ABNORMAL HIGH (ref 70–99)
Potassium: 3.6 mmol/L (ref 3.5–5.1)
Sodium: 129 mmol/L — ABNORMAL LOW (ref 135–145)
Total Bilirubin: 0.9 mg/dL (ref 0.3–1.2)
Total Protein: 6.4 g/dL — ABNORMAL LOW (ref 6.5–8.1)

## 2021-07-29 LAB — CBC WITH DIFFERENTIAL/PLATELET
Abs Immature Granulocytes: 0.04 10*3/uL (ref 0.00–0.07)
Basophils Absolute: 0 10*3/uL (ref 0.0–0.1)
Basophils Relative: 0 %
Eosinophils Absolute: 0.2 10*3/uL (ref 0.0–0.5)
Eosinophils Relative: 3 %
HCT: 38.9 % (ref 36.0–46.0)
Hemoglobin: 13.2 g/dL (ref 12.0–15.0)
Immature Granulocytes: 1 %
Lymphocytes Relative: 38 %
Lymphs Abs: 3 10*3/uL (ref 0.7–4.0)
MCH: 30.1 pg (ref 26.0–34.0)
MCHC: 33.9 g/dL (ref 30.0–36.0)
MCV: 88.8 fL (ref 80.0–100.0)
Monocytes Absolute: 0.7 10*3/uL (ref 0.1–1.0)
Monocytes Relative: 8 %
Neutro Abs: 3.9 10*3/uL (ref 1.7–7.7)
Neutrophils Relative %: 50 %
Platelets: 262 10*3/uL (ref 150–400)
RBC: 4.38 MIL/uL (ref 3.87–5.11)
RDW: 15.4 % (ref 11.5–15.5)
WBC: 7.8 10*3/uL (ref 4.0–10.5)
nRBC: 0 % (ref 0.0–0.2)

## 2021-07-29 LAB — TYPE AND SCREEN
ABO/RH(D): A POS
Antibody Screen: NEGATIVE

## 2021-07-29 LAB — PROTIME-INR
INR: 2 — ABNORMAL HIGH (ref 0.8–1.2)
Prothrombin Time: 22.7 seconds — ABNORMAL HIGH (ref 11.4–15.2)

## 2021-07-29 LAB — BRAIN NATRIURETIC PEPTIDE: B Natriuretic Peptide: 259.3 pg/mL — ABNORMAL HIGH (ref 0.0–100.0)

## 2021-07-29 LAB — APTT: aPTT: 34 seconds (ref 24–36)

## 2021-07-29 MED ORDER — LORATADINE 10 MG PO TABS
10.0000 mg | ORAL_TABLET | Freq: Every day | ORAL | Status: DC
Start: 2021-07-30 — End: 2021-08-01
  Administered 2021-07-30 – 2021-08-01 (×3): 10 mg via ORAL
  Filled 2021-07-29 (×3): qty 1

## 2021-07-29 MED ORDER — IPRATROPIUM-ALBUTEROL 0.5-2.5 (3) MG/3ML IN SOLN
3.0000 mL | Freq: Four times a day (QID) | RESPIRATORY_TRACT | Status: DC
  Administered 2021-07-30: 3 mL via RESPIRATORY_TRACT
  Filled 2021-07-29 (×2): qty 3

## 2021-07-29 MED ORDER — METHYLPREDNISOLONE SODIUM SUCC 125 MG IJ SOLR
80.0000 mg | INTRAMUSCULAR | Status: DC
Start: 2021-07-29 — End: 2021-07-30
  Administered 2021-07-29: 80 mg via INTRAVENOUS
  Filled 2021-07-29: qty 2

## 2021-07-29 MED ORDER — SODIUM CHLORIDE 0.9 % IV SOLN: INTRAVENOUS | Status: DC

## 2021-07-29 MED ORDER — METHOCARBAMOL 500 MG PO TABS
500.0000 mg | ORAL_TABLET | Freq: Three times a day (TID) | ORAL | Status: DC | PRN
Start: 2021-07-29 — End: 2021-07-30
  Administered 2021-07-29: 500 mg via ORAL
  Filled 2021-07-29 (×2): qty 1

## 2021-07-29 MED ORDER — ZOLPIDEM TARTRATE 5 MG PO TABS: 2.5000 mg | ORAL_TABLET | Freq: Every evening | ORAL | Status: DC | PRN

## 2021-07-29 MED ORDER — ADULT MULTIVITAMIN W/MINERALS CH
1.0000 | ORAL_TABLET | Freq: Every day | ORAL | Status: DC
  Administered 2021-07-29 – 2021-08-01 (×4): 1 via ORAL
  Filled 2021-07-29 (×5): qty 1

## 2021-07-29 MED ORDER — METOPROLOL TARTRATE 25 MG PO TABS
12.5000 mg | ORAL_TABLET | Freq: Two times a day (BID) | ORAL | Status: DC
  Administered 2021-07-29 – 2021-08-01 (×6): 12.5 mg via ORAL
  Filled 2021-07-29 (×6): qty 1

## 2021-07-29 MED ORDER — HYDROMORPHONE HCL 1 MG/ML IJ SOLN
1.0000 mg | INTRAMUSCULAR | Status: DC | PRN
  Administered 2021-07-29 – 2021-07-30 (×3): 1 mg via INTRAVENOUS
  Filled 2021-07-29 (×3): qty 1

## 2021-07-29 MED ORDER — MORPHINE SULFATE (PF) 2 MG/ML IV SOLN: 2.0000 mg | INTRAVENOUS | Status: DC | PRN

## 2021-07-29 MED ORDER — CHLORHEXIDINE GLUCONATE 4 % EX LIQD: 1.0000 "application " | Freq: Once | CUTANEOUS | Status: DC

## 2021-07-29 MED ORDER — TRANEXAMIC ACID-NACL 1000-0.7 MG/100ML-% IV SOLN
1000.0000 mg | INTRAVENOUS | Status: AC
  Administered 2021-07-30: 1000 mg via INTRAVENOUS
  Filled 2021-07-29: qty 100

## 2021-07-29 MED ORDER — HYDROCODONE-ACETAMINOPHEN 5-325 MG PO TABS
1.0000 | ORAL_TABLET | ORAL | Status: DC | PRN
  Administered 2021-07-29: 1 via ORAL
  Filled 2021-07-29: qty 1

## 2021-07-29 MED ORDER — CEFAZOLIN SODIUM-DEXTROSE 2-4 GM/100ML-% IV SOLN
2.0000 g | INTRAVENOUS | Status: AC
  Administered 2021-07-30: 2 g via INTRAVENOUS
  Filled 2021-07-29: qty 100

## 2021-07-29 MED ORDER — VITAMIN D 25 MCG (1000 UNIT) PO TABS
5000.0000 [IU] | ORAL_TABLET | Freq: Every day | ORAL | Status: DC
  Administered 2021-07-30 – 2021-08-01 (×3): 5000 [IU] via ORAL
  Filled 2021-07-29 (×5): qty 5

## 2021-07-29 MED ORDER — PANTOPRAZOLE SODIUM 40 MG PO TBEC
40.0000 mg | DELAYED_RELEASE_TABLET | Freq: Every day | ORAL | Status: DC
Start: 2021-07-30 — End: 2021-08-01
  Administered 2021-07-30 – 2021-08-01 (×3): 40 mg via ORAL
  Filled 2021-07-29 (×3): qty 1

## 2021-07-29 MED ORDER — SIMVASTATIN 20 MG PO TABS
20.0000 mg | ORAL_TABLET | Freq: Every day | ORAL | Status: DC
  Administered 2021-07-29 – 2021-07-31 (×3): 20 mg via ORAL
  Filled 2021-07-29 (×3): qty 1

## 2021-07-29 MED ORDER — MORPHINE SULFATE (PF) 2 MG/ML IV SOLN: 1.0000 mg | INTRAVENOUS | Status: DC | PRN

## 2021-07-29 MED ORDER — BUPROPION HCL ER (XL) 150 MG PO TB24
300.0000 mg | ORAL_TABLET | Freq: Every day | ORAL | Status: DC
  Administered 2021-07-30 – 2021-08-01 (×3): 300 mg via ORAL
  Filled 2021-07-29 (×3): qty 2

## 2021-07-29 MED ORDER — VITAMIN K1 10 MG/ML IJ SOLN
10.0000 mg | Freq: Once | INTRAVENOUS | Status: AC
  Administered 2021-07-29: 10 mg via INTRAVENOUS
  Filled 2021-07-29: qty 1

## 2021-07-29 MED ORDER — TRANEXAMIC ACID-NACL 1000-0.7 MG/100ML-% IV SOLN: 1000.0000 mg | INTRAVENOUS | Status: DC

## 2021-07-29 MED ORDER — IPRATROPIUM-ALBUTEROL 0.5-2.5 (3) MG/3ML IN SOLN
3.0000 mL | RESPIRATORY_TRACT | Status: DC
  Administered 2021-07-29: 3 mL via RESPIRATORY_TRACT
  Filled 2021-07-29: qty 3

## 2021-07-29 MED ORDER — CLINDAMYCIN PHOSPHATE 600 MG/50ML IV SOLN
600.0000 mg | INTRAVENOUS | Status: AC
  Administered 2021-07-30: 600 mg via INTRAVENOUS
  Filled 2021-07-29: qty 50

## 2021-07-29 MED ORDER — ALBUTEROL SULFATE (2.5 MG/3ML) 0.083% IN NEBU: 2.5000 mg | INHALATION_SOLUTION | RESPIRATORY_TRACT | Status: DC | PRN

## 2021-07-29 MED ORDER — MORPHINE SULFATE (PF) 4 MG/ML IV SOLN
4.0000 mg | Freq: Once | INTRAVENOUS | Status: AC
  Administered 2021-07-29: 4 mg via INTRAVENOUS
  Filled 2021-07-29: qty 1

## 2021-07-29 MED ORDER — CYANOCOBALAMIN 500 MCG PO TABS
250.0000 ug | ORAL_TABLET | Freq: Every day | ORAL | Status: DC
  Administered 2021-07-30 – 2021-08-01 (×3): 250 ug via ORAL
  Filled 2021-07-29 (×3): qty 1

## 2021-07-29 MED ORDER — MIDODRINE HCL 5 MG PO TABS
5.0000 mg | ORAL_TABLET | Freq: Three times a day (TID) | ORAL | Status: DC
  Administered 2021-07-29 – 2021-08-01 (×6): 5 mg via ORAL
  Filled 2021-07-29 (×7): qty 1

## 2021-07-29 MED ORDER — SENNOSIDES-DOCUSATE SODIUM 8.6-50 MG PO TABS
1.0000 | ORAL_TABLET | Freq: Every evening | ORAL | Status: DC | PRN
  Administered 2021-07-30: 1 via ORAL

## 2021-07-29 MED ORDER — FLUTICASONE PROPIONATE 50 MCG/ACT NA SUSP
1.0000 | Freq: Every day | NASAL | Status: DC | PRN
  Filled 2021-07-29: qty 16

## 2021-07-29 MED ORDER — TAMSULOSIN HCL 0.4 MG PO CAPS
0.4000 mg | ORAL_CAPSULE | Freq: Every day | ORAL | Status: DC
Start: 2021-07-30 — End: 2021-08-01
  Administered 2021-07-30 – 2021-08-01 (×3): 0.4 mg via ORAL
  Filled 2021-07-29 (×3): qty 1

## 2021-07-29 MED ORDER — ACETAMINOPHEN 325 MG PO TABS
650.0000 mg | ORAL_TABLET | Freq: Four times a day (QID) | ORAL | Status: DC | PRN
Start: 2021-07-29 — End: 2021-07-30
  Administered 2021-07-29: 650 mg via ORAL
  Filled 2021-07-29: qty 2

## 2021-07-29 MED ORDER — FAMOTIDINE 20 MG PO TABS
10.0000 mg | ORAL_TABLET | Freq: Every day | ORAL | Status: DC
  Administered 2021-07-29 – 2021-07-31 (×3): 10 mg via ORAL
  Filled 2021-07-29 (×3): qty 1

## 2021-07-29 MED ORDER — DOXYCYCLINE HYCLATE 100 MG PO TABS
100.0000 mg | ORAL_TABLET | Freq: Two times a day (BID) | ORAL | Status: DC
  Administered 2021-07-29 – 2021-08-01 (×6): 100 mg via ORAL
  Filled 2021-07-29 (×6): qty 1

## 2021-07-29 MED ORDER — OXYCODONE-ACETAMINOPHEN 5-325 MG PO TABS: 1.0000 | ORAL_TABLET | ORAL | Status: DC | PRN

## 2021-07-29 MED ORDER — DM-GUAIFENESIN ER 30-600 MG PO TB12
1.0000 | ORAL_TABLET | Freq: Two times a day (BID) | ORAL | Status: DC | PRN
  Administered 2021-07-29 – 2021-07-31 (×3): 1 via ORAL
  Filled 2021-07-29 (×5): qty 1

## 2021-07-29 NOTE — Progress Notes (Addendum)
Brief Cardiology Consult Note ? ?Pre op clearance for Hip surgery  ? ?Impression  ?Fractured Left Hip ?Atrial Fibrillation chronic ?Coagulopathy from Coumadin INR 22 ?Hx CHF-Diastolic dysfunction PY=09% ?Tachycardia  ?Hyponatremia  ?Accidentall Fall ? ? ?Plan ?Patient is a mild to mod risk for surgery  ?Continue B-blocker pre and post for rate control ?Hold Coumadin f/u INR tomorrow  ?Consider vitamin  K  2 mg today SQ/IM/PO? ?Correct electrolytes  ?Recommend PT/OT for strength and balance  ?Full note to follow  ? ?Jessica Long  ?Cardiology

## 2021-07-29 NOTE — H&P (Signed)
?History and Physical  ? ? ?Jessica Long TGG:[id number] DOB: 1946-02-26 DOA: 07/29/2021 ? ?Referring MD/NP/PA:  ? ?PCP: [facility]  ? ?Patient coming from:  The patient is coming from ALF ? ? ?Chief Complaint: fall ? ?HPI: Jessica Long is a 76 y.o. female with medical history significant of dCHF, A fib on Coumadin, HTL, COPD, stroke, depression, CKD-3A, breast cancer, kidney stone, chronic hyponatremia, who presents with fall. ? ?Pt states that she accidentally lost her balance and fell in the facility.  She fell on her left side, injured her left hip and left shoulder.  She states that she has severe pain in left hip, which is constant, sharp, nonradiating.  Denies loss of consciousness.  No headache or neck pain.  Patient states she has shortness of breath and cough with yellow-colored sputum production.  No chest pain.  No fever or chills.  Patient denies nausea vomiting, diarrhea or abdominal pain.  No symptoms of UTI. ? ?Data Reviewed and ED Course: pt was found to have WBC 7.8, INR 2.0, sodium 129 (133, 07/23/2021), worsening renal function, temperature normal, blood pressure 147/71, heart rate 83, RR 25, oxygen saturation 93-100% on room air.  Chest x-ray negative.  CT head is negative for acute intracranial abnormalities.  CT of C-spine is negative for bony fracture, but showed left upper lobe groundglass opacity.  X-ray of left shoulder is negative.  X-ray of pelvis and left femur showed subcapital left femoral neck fracture with impaction and varus angulation. Pt is admitted to telemetry bed as inpatient.  Dr. Sabra Heck of ortho is consulted.  Dr. Clayborn Bigness of cardiology is consulted.  ? ?EKG: I have personally reviewed.  Seems to be sinus rhythm, QTc 486, right bundle blockade, low voltage ? ?Review of Systems:  ? ?General: no fevers, chills, no body weight gain, has fatigue ?HEENT: no blurry vision, hearing changes or sore throat ?Respiratory: has dyspnea, coughing, no wheezing ?CV: no chest  pain, no palpitations ?GI: no nausea, vomiting, abdominal pain, diarrhea, constipation ?GU: no dysuria, burning on urination, increased urinary frequency, hematuria  ?Ext: has leg edema ?Neuro: no unilateral weakness, numbness, or tingling, no vision change or hearing loss. Has fall. ?Skin: no rash, no skin tear. ?MSK: has left hip and left shoulder pain ?Heme: No easy bruising.  ?Travel history: No recent long distant travel. ? ? ?Allergy:  ?Allergies  ?Allergen Reactions  ? Amoxicillin Swelling  ?  Oral swelling  ? Ampicillin Swelling  ?  Oral swelling  ? ? ?Past Medical History:  ?Diagnosis Date  ? Allergic rhinitis   ? Aortic atherosclerosis ([place])   ? Atrial fibrillation ([place])   ? Breast cancer, right ([place])   ? CHF (congestive heart failure) ([place])   ? CKD (chronic kidney disease)   ? Current use of long term anticoagulation   ? Warfarin  ? Depression   ? History of kidney stones   ? Hyperlipemia   ? Osteoarthritis   ? Stroke [facility]) 2011  ? ? ?Past Surgical History:  ?Procedure Laterality Date  ? CYSTOSCOPY WITH URETEROSCOPY AND STENT PLACEMENT Left 09/03/2020  ? Procedure: CYSTOSCOPY  AND LEFT STENT PLACEMENT;  Surgeon: Ceasar Mons, MD;  Location: [facility] ORS;  Service: Urology;  Laterality: Left;  ? CYSTOSCOPY/URETEROSCOPY/HOLMIUM LASER/STENT PLACEMENT Left 10/25/2020  ? Procedure: CYSTOSCOPY/URETEROSCOPY/HOLMIUM LASER/STENT EXCHANGE;  Surgeon: Abbie Sons, MD;  Location: [facility] ORS;  Service: Urology;  Laterality: Left;  ? MASTECTOMY Right 2007  ? ? ?Social History:  reports that  she quit smoking about 12 years ago. Her smoking use included cigarettes. She has never used smokeless tobacco. She reports that she does not drink alcohol and does not use drugs. ? ?Family History:  ?Family History  ?Problem Relation Age of Onset  ? Heart disease Mother   ? Heart attack Father   ? Parkinson's disease Sister   ? Heart disease Sister   ? Heart attack Brother   ? COPD Sister   ?  ? ?Prior to Admission  medications   ?Medication Sig Start Date End Date Taking? Authorizing Provider  ?acetaminophen (TYLENOL) 500 MG tablet Take 2 tablets (1,000 mg total) by mouth every 8 (eight) hours as needed for moderate pain or headache. 09/06/20   Enzo Bi, MD  ?acetaminophen (TYLENOL) 500 MG tablet Take 1,000 mg by mouth 2 (two) times daily.    [provider]  ?buPROPion (WELLBUTRIN XL) 300 MG 24 hr tablet Take 300 mg by mouth daily.    [provider]  ?cetirizine (ZYRTEC) 10 MG tablet Take 10 mg by mouth daily.    [provider]  ?Cholecalciferol 125 MCG (5000 UT) capsule Take 5,000 Units by mouth daily.    [provider]  ?famotidine (PEPCID) 10 MG tablet Take 1 tablet (10 mg total) by mouth at bedtime. 09/06/20   Enzo Bi, MD  ?fluticasone Asencion Islam) 50 MCG/ACT nasal spray Place 1 spray into both nostrils daily.    [provider]  ?metoprolol tartrate (LOPRESSOR) 25 MG tablet Take 0.5 tablets (12.5 mg total) by mouth 2 (two) times daily. 09/06/20   Enzo Bi, MD  ?midodrine (PROAMATINE) 5 MG tablet Take 5 mg by mouth 3 (three) times daily. 07/13/21   [provider]  ?MULTIPLE VITAMIN PO Take 1 tablet by mouth daily.    [provider]  ?omeprazole (PRILOSEC) 20 MG capsule Take 20 mg by mouth daily. 07/13/21   [provider]  ?polyethylene glycol (MIRALAX / GLYCOLAX) 17 g packet Take 34 g by mouth daily as needed. 09/06/20   Enzo Bi, MD  ?potassium citrate (UROCIT-K) 10 MEQ (1080 MG) SR tablet Take 10 mEq by mouth 2 (two) times daily. 07/03/21   [provider]  ?PROAIR HFA 108 (90 Base) MCG/ACT inhaler Inhale 2 puffs into the lungs every 4 (four) hours as needed. 07/13/21   [provider]  ?selenium sulfide (SELSUN) 1 % LOTN Apply 1 application. topically as directed. Every Monday and Friday for dry scalp.    [provider]  ?simvastatin (ZOCOR) 20 MG tablet Take 20 mg by mouth at bedtime. 07/03/21   [provider]   ?sodium chloride (OCEAN) 0.65 % SOLN nasal spray Place 2 sprays into both nostrils as needed for congestion.    [provider]  ?tamsulosin (FLOMAX) 0.4 MG CAPS capsule Take 1 capsule (0.4 mg total) by mouth daily. 09/06/20   Enzo Bi, MD  ?torsemide (DEMADEX) 20 MG tablet Take 20 mg by mouth daily. 07/03/21   [provider]  ?vitamin B-12 (CYANOCOBALAMIN) 250 MCG tablet Take 250 mcg by mouth daily.    [provider]  ?warfarin (COUMADIN) 2.5 MG tablet Take 2.5 mg by mouth daily.    [provider]  ?zolpidem (AMBIEN) 5 MG tablet Take 0.5 tablets (2.5 mg total) by mouth at bedtime as needed for sleep. 09/06/20   Enzo Bi, MD  ? ? ?Physical Exam: ?Vitals:  ? 07/29/21 1645 07/29/21 1700 07/29/21 1715 07/29/21 1730  ?BP:  125/68  Marland Kitchen)  149/75  ?Pulse: 83 80 83 92  ?Resp: '19 14 14 '$ (!) 23  ?Temp:      ?TempSrc:      ?SpO2: 94% 91% 90% 93%  ?Weight:      ?Height:      ? ?General: Not in acute distress ?HEENT: ?      Eyes: PERRL, EOMI, no scleral icterus. ?      ENT: No discharge from the ears and nose, no pharynx injection, no tonsillar enlargement.  ?      Neck: No JVD, no bruit, no mass felt. ?Heme: No neck lymph node enlargement. ?Cardiac: S1/S2, RRR, No murmurs, No gallops or rubs. ?Respiratory: has rhonchi bilaterally ?GI: Soft, nondistended, nontender, no rebound pain, no organomegaly, BS present. ?GU: No hematuria ?Ext: has trace leg edema bilaterally. 1+DP/PT pulse bilaterally. ?Musculoskeletal: has tenderness in left hip and shoulder ?Skin: No rashes.  ?Neuro: Alert, oriented X3, cranial nerves II-XII grossly intact, moves all extremities normally.  ?Psych: Patient is not psychotic, no suicidal or hemocidal ideation. ? ?Labs on Admission: I have personally reviewed following labs and imaging studies ? ?CBC: ?Recent Labs  ?Lab 07/22/21 ?2023 07/23/21 ?0500 07/24/21 ?0413 07/29/21 ?1527  ?WBC 16.9* 9.9 8.7 7.8  ?NEUTROABS 14.9*  --   --  3.9  ?HGB 15.9* 13.5 11.6* 13.2  ?HCT 49.2*  40.5 35.2* 38.9  ?MCV 92.5 90.6 92.1 88.8  ?PLT 237 248 193 262  ? ?Basic Metabolic Panel: ?Recent Labs  ?Lab 07/22/21 ?2023 07/23/21 ?0500 07/29/21 ?1527  ?NA 129* 133* 129*  ?K 3.9 4.3 3.6  ?CL 94* 101 93*  ?CO2

## 2021-07-29 NOTE — ED Triage Notes (Signed)
Pt BIBA for unwitnessed fall at [facility]. Pt a/ox4, states she walks with walker and lost her balance, falling onto left side. Pt unsure if she hit her head. + thinners. +pmsc.  ?

## 2021-07-29 NOTE — ED Notes (Signed)
Family at bedside. 

## 2021-07-29 NOTE — Progress Notes (Signed)
1833 ?Assessment Completed. Ax4. Lungs with Exp wheeze bilaterally  on 2L [place]. 20 G IV to L hand. Dual skin assesment with amanda  Skin intact. Left hip no noted redness, excessive swelling or bruising. Pt stated 10/10 pain. Placed on tele box 32. Plan for pt to be NPO at midnight possible surgery tomorrow pt aware. Personal belongings and call bell within reach. Bed in locked and low position. Nurse will cont. To monitor. ? ?1907 ?Pt took Coumadin last night. Vitamin K hung at this time per order in the case of surgery tomorrow. ?

## 2021-07-29 NOTE — ED Notes (Signed)
Per MD Sabra Heck, he would like pt to have Vit K STAT for reversal for surgery tomorrow. MD informed it will be given as soon as pharmacy verifies and sends it.  ?

## 2021-07-29 NOTE — ED Notes (Signed)
Patient transported to CT 

## 2021-07-29 NOTE — ED Provider Notes (Signed)
? ?[facility] ?Provider Note ? ? Event Date/Time  ? First MD Initiated Contact with Patient 07/29/21 1517   ?  (approximate) ?History  ?Hip Pain ? ?HPI ?Jessica Long is a 76 y.o. female with a stated past medical history of atrial fibrillation, heart failure, and currently residing in a long-term care facility presents via EMS after a stated mechanical fall from standing resulting in left hip and left shoulder pain.  Patient denies hitting her head or losing consciousness.  Patient denies any subsequent lightheadedness.  Patient states that any movement at the left lower extremity worsens her hip pain.  Patient was given 50 mcg of fentanyl in route by EMS but states that this pain is still a 9/10. ?Physical Exam  ?Triage Vital Signs: ?ED Triage Vitals [07/29/21 1521]  ?Enc Vitals Group  ?   BP   ?   Pulse   ?   Resp   ?   Temp   ?   Temp src   ?   SpO2 100 %  ?   Weight   ?   Height   ?   Head Circumference   ?   Peak Flow   ?   Pain Score 9  ?   Pain Loc   ?   Pain Edu?   ?   Excl. in [place]?   ? ?Most recent vital signs: ?Vitals:  ? 07/29/21 1522 07/29/21 1641  ?BP: 132/79 (!) 147/71  ?Pulse: 81 83  ?Resp: 18 (!) 25  ?Temp: 97.6 ?F (36.4 ?C)   ?SpO2: 100% 93%  ? ?General: Awake, oriented x4. ?CV:  Good peripheral perfusion.  ?Resp:  Normal effort.  ?Abd:  No distention.  ?Other:  Overweight elderly Caucasian female laying in bed in moderate distress secondary to pain.  Significant tenderness with any attempted range of motion at the left hip and left shoulder with palpable distal pulses in all extremities ?ED Results / Procedures / Treatments  ?Labs ?(all labs ordered are listed, but only abnormal results are displayed) ?Labs Reviewed  ?COMPREHENSIVE METABOLIC PANEL - Abnormal; Notable for the following components:  ?    Result Value  ? Sodium 129 (*)   ? Chloride 93 (*)   ? Glucose, Bld 116 (*)   ? Creatinine, Ser 1.38 (*)   ? Total Protein 6.4 (*)   ? GFR, Estimated 39 (*)   ? All other  components within normal limits  ?PROTIME-INR - Abnormal; Notable for the following components:  ? Prothrombin Time 22.7 (*)   ? INR 2.0 (*)   ? All other components within normal limits  ?CBC WITH DIFFERENTIAL/PLATELET  ?BRAIN NATRIURETIC PEPTIDE  ?PROTIME-INR  ? ?EKG ?ED ECG REPORT ?I, Naaman Plummer, the attending physician, personally viewed and interpreted this ECG. ?Date: 07/29/2021 ?EKG Time: 1520 ?Rate: 84 ?Rhythm: Atrial flutter ?QRS Axis: normal ?Intervals: normal ?ST/T Wave abnormalities: normal ?Narrative Interpretation: Atrial flutter.  No evidence of acute ischemia ?RADIOLOGY ?ED MD interpretation: One-view portable chest x-ray interpreted by me shows no evidence of acute abnormalities including no pneumonia, pneumothorax, or widened mediastinum ?2 view x-ray of the pelvis as well as three-view x-ray of the femur shows impacted subcapital left femoral neck fracture ?CT of the head without contrast interpreted by me shows no evidence of acute abnormalities including no intracerebral hemorrhage, obvious masses, or significant edema ?CT of the cervical spine interpreted by me does not show any evidence of acute abnormalities including no acute fracture, malalignment,  height loss, or dislocation ?2 view x-ray of the left shoulder as interpreted by me shows no evidence of acute abnormalities including no fracture or dislocation ?-Agree with radiology assessment ?Official radiology report(s): ?DG Chest 1 View ? ?Result Date: 07/29/2021 ?CLINICAL DATA:  [place] on the left side EXAM: CHEST  1 VIEW COMPARISON:  07/23/2021 FINDINGS: Single frontal view of the chest demonstrates a stable cardiac silhouette. No airspace disease, effusion, or pneumothorax. No acute bony abnormality. IMPRESSION: 1. No acute intrathoracic process. Electronically Signed   By: Randa Ngo M.D.   On: 07/29/2021 16:30  ? ?DG Pelvis 1-2 Views ? ?Result Date: 07/29/2021 ?CLINICAL DATA:  Fell, pain EXAM: PELVIS - 1-2 VIEW COMPARISON:  None.  FINDINGS: Single frontal view of the pelvis demonstrates a subcapital left femoral neck fracture with impaction and varus angulation at the fracture site. No dislocation. The right hip is unremarkable. The remainder of the bony pelvis is normal. IMPRESSION: 1. Impacted subcapital left femoral neck fracture. Electronically Signed   By: Randa Ngo M.D.   On: 07/29/2021 16:27  ? ?CT Head Wo Contrast ? ?Result Date: 07/29/2021 ?CLINICAL DATA:  [place], head trauma EXAM: CT HEAD WITHOUT CONTRAST TECHNIQUE: Contiguous axial images were obtained from the base of the skull through the vertex without intravenous contrast. RADIATION DOSE REDUCTION: This exam was performed according to the departmental dose-optimization program which includes automated exposure control, adjustment of the mA and/or kV according to patient size and/or use of iterative reconstruction technique. COMPARISON:  06/10/2014 FINDINGS: Brain: No acute infarct or hemorrhage. Focal hypodensities in the left frontal periventricular white matter consistent with chronic small vessel ischemic change. Lateral ventricles are unremarkable. 4 mm hyperdense region in the third ventricle reference image 20/2 compatible with colloid cyst. Previously this had measured up to 13 mm. No evidence of mass effect or hydrocephalus. No acute extra-axial fluid collections. No mass effect. Vascular: No hyperdense vessel or unexpected calcification. Skull: Normal. Negative for fracture or focal lesion. Sinuses/Orbits: There is diffuse mucosal thickening with partial opacification of the frontal, ethmoid, maxillary sinuses. Small gas fluid levels are seen within the maxillary sinuses bilaterally. Other: None. IMPRESSION: 1. No acute intracranial process. 2. 4 mm hyperdense colloid cyst, decreased in size since prior study where this measured up to 13 mm. 3. Diffuse paranasal sinus disease. Electronically Signed   By: Randa Ngo M.D.   On: 07/29/2021 16:12  ? ?CT Cervical  Spine Wo Contrast ? ?Result Date: 07/29/2021 ?CLINICAL DATA:  [place], head trauma EXAM: CT CERVICAL SPINE WITHOUT CONTRAST TECHNIQUE: Multidetector CT imaging of the cervical spine was performed without intravenous contrast. Multiplanar CT image reconstructions were also generated. RADIATION DOSE REDUCTION: This exam was performed according to the departmental dose-optimization program which includes automated exposure control, adjustment of the mA and/or kV according to patient size and/or use of iterative reconstruction technique. COMPARISON:  None. FINDINGS: Alignment: There is straightening of the lower cervical spine likely due to multilevel spondylosis. Otherwise alignment is anatomic. Skull base and vertebrae: No acute fracture. No primary bone lesion or focal pathologic process. Soft tissues and spinal canal: No prevertebral fluid or swelling. No visible canal hematoma. Disc levels: Multilevel spondylosis and facet hypertrophy. There is bony fusion across the C2-3 facet joints. Prominent facet hypertrophic changes are seen at C3-4 and C4-5. There is partial bony fusion across the C6-7 disc space. Prominent spondylosis at C5-6. Upper chest: Airway is patent. Patchy dependent ground-glass airspace disease within the left upper lobe could be hypoventilatory. Other: Reconstructed  images demonstrate no additional findings. IMPRESSION: 1. No acute cervical spine fracture. 2. Multilevel spondylosis and facet hypertrophy. 3. Dependent left upper lobe ground-glass consolidation, which could be related to hypoventilatory change or developing airspace disease. Electronically Signed   By: Randa Ngo M.D.   On: 07/29/2021 16:09  ? ?DG Shoulder Left ? ?Result Date: 07/29/2021 ?CLINICAL DATA:  Fell, pain EXAM: LEFT SHOULDER - 2+ VIEW COMPARISON:  None. FINDINGS: Frontal and transscapular views of the left shoulder are obtained. No fracture, subluxation, or dislocation. Mild glenohumeral and acromioclavicular joint  osteoarthritis. Visualized portions of the left chest are clear. IMPRESSION: 1. Osteoarthritis.  No acute fracture. Electronically Signed   By: Randa Ngo M.D.   On: 07/29/2021 16:27  ? ?DG FEMUR MIN 2 VIEWS LEFT

## 2021-07-30 ENCOUNTER — Other Ambulatory Visit: Payer: Self-pay

## 2021-07-30 ENCOUNTER — Inpatient Hospital Stay: Payer: Medicare Other

## 2021-07-30 ENCOUNTER — Inpatient Hospital Stay: Payer: Medicare Other | Admitting: Anesthesiology

## 2021-07-30 ENCOUNTER — Encounter
Admission: EM | Disposition: A | Discharge status: Skilled Nursing Facility | Payer: Self-pay | Source: Skilled Nursing Facility | Attending: Internal Medicine

## 2021-07-30 DIAGNOSIS — M25512 Pain in left shoulder: Secondary | ICD-10-CM

## 2021-07-30 HISTORY — PX: HIP ARTHROPLASTY: SHX981

## 2021-07-30 LAB — CBC
HCT: 39.8 % (ref 36.0–46.0)
HCT: 37.8 % (ref 36.0–46.0)
Hemoglobin: 13.5 g/dL (ref 12.0–15.0)
Hemoglobin: 12.6 g/dL (ref 12.0–15.0)
MCH: 30.5 pg (ref 26.0–34.0)
MCH: 29.9 pg (ref 26.0–34.0)
MCHC: 33.9 g/dL (ref 30.0–36.0)
MCHC: 33.3 g/dL (ref 30.0–36.0)
MCV: 89.8 fL (ref 80.0–100.0)
MCV: 89.8 fL (ref 80.0–100.0)
Platelets: 241 10*3/uL (ref 150–400)
Platelets: 250 10*3/uL (ref 150–400)
RBC: 4.43 MIL/uL (ref 3.87–5.11)
RBC: 4.21 MIL/uL (ref 3.87–5.11)
RDW: 15.4 % (ref 11.5–15.5)
RDW: 15.7 % — ABNORMAL HIGH (ref 11.5–15.5)
WBC: 7.6 10*3/uL (ref 4.0–10.5)
WBC: 10.6 10*3/uL — ABNORMAL HIGH (ref 4.0–10.5)
nRBC: 0 % (ref 0.0–0.2)
nRBC: 0 % (ref 0.0–0.2)

## 2021-07-30 LAB — SURGICAL PCR SCREEN
MRSA, PCR: NEGATIVE
Staphylococcus aureus: NEGATIVE

## 2021-07-30 LAB — BASIC METABOLIC PANEL
Anion gap: 8 (ref 5–15)
BUN: 15 mg/dL (ref 8–23)
CO2: 25 mmol/L (ref 22–32)
Calcium: 9.1 mg/dL (ref 8.9–10.3)
Chloride: 95 mmol/L — ABNORMAL LOW (ref 98–111)
Creatinine, Ser: 1.06 mg/dL — ABNORMAL HIGH (ref 0.44–1.00)
GFR, Estimated: 54 mL/min — ABNORMAL LOW (ref >60)
Glucose, Bld: 165 mg/dL — ABNORMAL HIGH (ref 70–99)
Potassium: 4.2 mmol/L (ref 3.5–5.1)
Sodium: 128 mmol/L — ABNORMAL LOW (ref 135–145)

## 2021-07-30 LAB — PROTIME-INR
INR: 1.2 (ref 0.8–1.2)
Prothrombin Time: 15.3 seconds — ABNORMAL HIGH (ref 11.4–15.2)

## 2021-07-30 LAB — RESP PANEL BY RT-PCR (FLU A&B, COVID) ARPGX2
Influenza A by PCR: NEGATIVE
Influenza B by PCR: NEGATIVE
SARS Coronavirus 2 by RT PCR: NEGATIVE

## 2021-07-30 LAB — CREATININE, SERUM
Creatinine, Ser: 1 mg/dL (ref 0.44–1.00)
GFR, Estimated: 58 mL/min — ABNORMAL LOW (ref >60)

## 2021-07-30 SURGERY — HEMIARTHROPLASTY, HIP, DIRECT ANTERIOR APPROACH, FOR FRACTURE
Anesthesia: Spinal | Site: Hip | Laterality: Left

## 2021-07-30 MED ORDER — BISACODYL 10 MG RE SUPP
10.0000 mg | Freq: Every day | RECTAL | Status: DC | PRN
  Administered 2021-07-30: 10 mg via RECTAL
  Filled 2021-07-30: qty 1

## 2021-07-30 MED ORDER — MENTHOL 3 MG MT LOZG: 1.0000 | LOZENGE | OROMUCOSAL | Status: DC | PRN

## 2021-07-30 MED ORDER — 0.9 % SODIUM CHLORIDE (POUR BTL) OPTIME
TOPICAL | Status: DC | PRN
  Administered 2021-07-30: 1000 mL

## 2021-07-30 MED ORDER — BUPIVACAINE HCL (PF) 0.5 % IJ SOLN
INTRAMUSCULAR | Status: DC | PRN
Start: 2021-07-30 — End: 2021-07-30
  Administered 2021-07-30: 2.4 mL

## 2021-07-30 MED ORDER — CLINDAMYCIN PHOSPHATE 600 MG/50ML IV SOLN
600.0000 mg | Freq: Three times a day (TID) | INTRAVENOUS | Status: AC
  Administered 2021-07-30 – 2021-07-31 (×3): 600 mg via INTRAVENOUS
  Filled 2021-07-30 (×3): qty 50

## 2021-07-30 MED ORDER — NEOMYCIN-POLYMYXIN B GU 40-200000 IR SOLN
Status: DC | PRN
  Administered 2021-07-30: 8 mL

## 2021-07-30 MED ORDER — ZOLPIDEM TARTRATE 5 MG PO TABS: 5.0000 mg | ORAL_TABLET | Freq: Every evening | ORAL | Status: DC | PRN

## 2021-07-30 MED ORDER — ALUM & MAG HYDROXIDE-SIMETH 200-200-20 MG/5ML PO SUSP: 30.0000 mL | ORAL | Status: DC | PRN

## 2021-07-30 MED ORDER — METHOCARBAMOL 500 MG PO TABS
500.0000 mg | ORAL_TABLET | Freq: Four times a day (QID) | ORAL | Status: DC | PRN
  Administered 2021-07-30: 500 mg via ORAL
  Filled 2021-07-30: qty 1

## 2021-07-30 MED ORDER — TRANEXAMIC ACID-NACL 1000-0.7 MG/100ML-% IV SOLN
INTRAVENOUS | Status: AC
  Administered 2021-07-30: 1000 mg via INTRAVENOUS
  Filled 2021-07-30: qty 100

## 2021-07-30 MED ORDER — CEFAZOLIN SODIUM-DEXTROSE 2-4 GM/100ML-% IV SOLN
2.0000 g | Freq: Three times a day (TID) | INTRAVENOUS | Status: AC
  Administered 2021-07-30 – 2021-07-31 (×3): 2 g via INTRAVENOUS
  Filled 2021-07-30 (×3): qty 100

## 2021-07-30 MED ORDER — WARFARIN SODIUM 2.5 MG PO TABS
2.5000 mg | ORAL_TABLET | Freq: Once | ORAL | Status: AC
  Administered 2021-07-30: 2.5 mg via ORAL
  Filled 2021-07-30: qty 1

## 2021-07-30 MED ORDER — METOCLOPRAMIDE HCL 5 MG/ML IJ SOLN: 5.0000 mg | Freq: Three times a day (TID) | INTRAMUSCULAR | Status: DC | PRN

## 2021-07-30 MED ORDER — ENOXAPARIN SODIUM 30 MG/0.3ML IJ SOSY: 30.0000 mg | PREFILLED_SYRINGE | INTRAMUSCULAR | Status: DC

## 2021-07-30 MED ORDER — MIDAZOLAM HCL 2 MG/2ML IJ SOLN
INTRAMUSCULAR | Status: AC
  Filled 2021-07-30: qty 2

## 2021-07-30 MED ORDER — TRANEXAMIC ACID-NACL 1000-0.7 MG/100ML-% IV SOLN: 1000.0000 mg | Freq: Once | INTRAVENOUS | Status: AC

## 2021-07-30 MED ORDER — PHENYLEPHRINE 40 MCG/ML (10ML) SYRINGE FOR IV PUSH (FOR BLOOD PRESSURE SUPPORT)
PREFILLED_SYRINGE | INTRAVENOUS | Status: DC | PRN
  Administered 2021-07-30 (×2): 80 ug via INTRAVENOUS

## 2021-07-30 MED ORDER — ONDANSETRON HCL 4 MG PO TABS
4.0000 mg | ORAL_TABLET | Freq: Four times a day (QID) | ORAL | Status: DC | PRN
  Administered 2021-07-31: 4 mg via ORAL
  Filled 2021-07-30: qty 1

## 2021-07-30 MED ORDER — PROPOFOL 500 MG/50ML IV EMUL
INTRAVENOUS | Status: DC | PRN
  Administered 2021-07-30: 40 ug/kg/min via INTRAVENOUS

## 2021-07-30 MED ORDER — METHOCARBAMOL 1000 MG/10ML IJ SOLN
500.0000 mg | Freq: Four times a day (QID) | INTRAVENOUS | Status: DC | PRN
  Filled 2021-07-30: qty 5

## 2021-07-30 MED ORDER — PHENYLEPHRINE 40 MCG/ML (10ML) SYRINGE FOR IV PUSH (FOR BLOOD PRESSURE SUPPORT)
PREFILLED_SYRINGE | INTRAVENOUS | Status: AC
  Filled 2021-07-30: qty 10

## 2021-07-30 MED ORDER — METOCLOPRAMIDE HCL 10 MG PO TABS: 5.0000 mg | ORAL_TABLET | Freq: Three times a day (TID) | ORAL | Status: DC | PRN

## 2021-07-30 MED ORDER — ALPRAZOLAM 0.25 MG PO TABS
0.2500 mg | ORAL_TABLET | Freq: Two times a day (BID) | ORAL | Status: DC | PRN
  Administered 2021-07-30 (×2): 0.25 mg via ORAL
  Filled 2021-07-30 (×2): qty 1

## 2021-07-30 MED ORDER — SODIUM CHLORIDE 0.45 % IV SOLN: INTRAVENOUS | Status: DC

## 2021-07-30 MED ORDER — ONDANSETRON HCL 4 MG/2ML IJ SOLN
INTRAMUSCULAR | Status: AC
  Filled 2021-07-30: qty 2

## 2021-07-30 MED ORDER — WARFARIN - PHARMACIST DOSING INPATIENT
Freq: Every day | Status: DC
Start: 2021-07-30 — End: 2021-08-01
  Administered 2021-08-01: 5

## 2021-07-30 MED ORDER — MIDAZOLAM HCL 5 MG/5ML IJ SOLN
INTRAMUSCULAR | Status: DC | PRN
  Administered 2021-07-30 (×2): .5 mg via INTRAVENOUS

## 2021-07-30 MED ORDER — FENTANYL CITRATE (PF) 100 MCG/2ML IJ SOLN: 25.0000 ug | INTRAMUSCULAR | Status: DC | PRN

## 2021-07-30 MED ORDER — CEFAZOLIN SODIUM-DEXTROSE 2-4 GM/100ML-% IV SOLN
INTRAVENOUS | Status: AC
  Filled 2021-07-30: qty 100

## 2021-07-30 MED ORDER — PROPOFOL 500 MG/50ML IV EMUL
INTRAVENOUS | Status: AC
  Filled 2021-07-30: qty 50

## 2021-07-30 MED ORDER — HYDROCODONE-ACETAMINOPHEN 7.5-325 MG PO TABS
1.0000 | ORAL_TABLET | ORAL | Status: DC | PRN
  Administered 2021-07-31: 1 via ORAL
  Filled 2021-07-30: qty 1

## 2021-07-30 MED ORDER — ACETAMINOPHEN 325 MG PO TABS: 325.0000 mg | ORAL_TABLET | Freq: Four times a day (QID) | ORAL | Status: DC | PRN

## 2021-07-30 MED ORDER — PHENYLEPHRINE HCL-NACL 20-0.9 MG/250ML-% IV SOLN
INTRAVENOUS | Status: AC
  Filled 2021-07-30: qty 250

## 2021-07-30 MED ORDER — PROPOFOL 10 MG/ML IV BOLUS
INTRAVENOUS | Status: DC | PRN
  Administered 2021-07-30 (×4): 10 mg via INTRAVENOUS

## 2021-07-30 MED ORDER — PHENYLEPHRINE HCL-NACL 20-0.9 MG/250ML-% IV SOLN
INTRAVENOUS | Status: DC | PRN
Start: 2021-07-30 — End: 2021-07-30
  Administered 2021-07-30: 40 ug/min via INTRAVENOUS

## 2021-07-30 MED ORDER — ONDANSETRON HCL 4 MG/2ML IJ SOLN
4.0000 mg | Freq: Four times a day (QID) | INTRAMUSCULAR | Status: DC | PRN
  Filled 2021-07-30: qty 2

## 2021-07-30 MED ORDER — SODIUM CHLORIDE 0.9 % IR SOLN
Status: DC | PRN
  Administered 2021-07-30: 1000 mL

## 2021-07-30 MED ORDER — FERROUS SULFATE 325 (65 FE) MG PO TABS
325.0000 mg | ORAL_TABLET | Freq: Every day | ORAL | Status: DC
  Administered 2021-07-31 – 2021-08-01 (×2): 325 mg via ORAL
  Filled 2021-07-30 (×2): qty 1

## 2021-07-30 MED ORDER — BUPIVACAINE HCL (PF) 0.5 % IJ SOLN
INTRAMUSCULAR | Status: AC
  Filled 2021-07-30: qty 10

## 2021-07-30 MED ORDER — ACETAMINOPHEN 10 MG/ML IV SOLN
1000.0000 mg | Freq: Once | INTRAVENOUS | Status: DC
Start: 2021-07-30 — End: 2021-07-30

## 2021-07-30 MED ORDER — MORPHINE SULFATE (PF) 2 MG/ML IV SOLN
0.5000 mg | INTRAVENOUS | Status: DC | PRN
  Administered 2021-07-30 (×2): 1 mg via INTRAVENOUS
  Filled 2021-07-30 (×2): qty 1

## 2021-07-30 MED ORDER — KETAMINE HCL 10 MG/ML IJ SOLN
INTRAMUSCULAR | Status: DC | PRN
Start: 2021-07-30 — End: 2021-07-30
  Administered 2021-07-30 (×4): 10 mg via INTRAVENOUS

## 2021-07-30 MED ORDER — IPRATROPIUM-ALBUTEROL 0.5-2.5 (3) MG/3ML IN SOLN
3.0000 mL | Freq: Four times a day (QID) | RESPIRATORY_TRACT | Status: DC | PRN
Start: 2021-07-30 — End: 2021-08-01

## 2021-07-30 MED ORDER — DOCUSATE SODIUM 100 MG PO CAPS
100.0000 mg | ORAL_CAPSULE | Freq: Two times a day (BID) | ORAL | Status: DC
  Administered 2021-07-30 – 2021-08-01 (×5): 100 mg via ORAL
  Filled 2021-07-30 (×5): qty 1

## 2021-07-30 MED ORDER — EPHEDRINE SULFATE (PRESSORS) 50 MG/ML IJ SOLN
INTRAMUSCULAR | Status: DC | PRN
  Administered 2021-07-30 (×2): 10 mg via INTRAVENOUS

## 2021-07-30 MED ORDER — KETAMINE HCL 50 MG/5ML IJ SOSY
PREFILLED_SYRINGE | INTRAMUSCULAR | Status: AC
  Filled 2021-07-30: qty 5

## 2021-07-30 MED ORDER — BUPIVACAINE-EPINEPHRINE (PF) 0.25% -1:200000 IJ SOLN
INTRAMUSCULAR | Status: DC | PRN
  Administered 2021-07-30: 30 mL via PERINEURAL

## 2021-07-30 MED ORDER — DROPERIDOL 2.5 MG/ML IJ SOLN
0.6250 mg | Freq: Once | INTRAMUSCULAR | Status: DC | PRN
  Filled 2021-07-30: qty 2

## 2021-07-30 MED ORDER — HYDROCODONE-ACETAMINOPHEN 5-325 MG PO TABS
1.0000 | ORAL_TABLET | ORAL | Status: DC | PRN
  Administered 2021-07-30 (×2): 1 via ORAL
  Administered 2021-07-31: 2 via ORAL
  Filled 2021-07-30: qty 1
  Filled 2021-07-30: qty 2
  Filled 2021-07-30: qty 1

## 2021-07-30 MED ORDER — ACETAMINOPHEN 10 MG/ML IV SOLN: 1000.0000 mg | Freq: Four times a day (QID) | INTRAVENOUS | Status: DC

## 2021-07-30 MED ORDER — FLEET ENEMA 7-19 GM/118ML RE ENEM: 1.0000 | ENEMA | Freq: Once | RECTAL | Status: DC | PRN

## 2021-07-30 MED ORDER — LACTATED RINGERS IV SOLN: INTRAVENOUS | Status: DC

## 2021-07-30 MED ORDER — ONDANSETRON HCL 4 MG/2ML IJ SOLN
4.0000 mg | Freq: Once | INTRAMUSCULAR | Status: AC | PRN
  Administered 2021-07-30: 4 mg via INTRAVENOUS

## 2021-07-30 MED ORDER — EPHEDRINE 5 MG/ML INJ
INTRAVENOUS | Status: AC
  Filled 2021-07-30: qty 5

## 2021-07-30 MED ORDER — LACTATED RINGERS IV SOLN: INTRAVENOUS | Status: DC | PRN

## 2021-07-30 MED ORDER — CLINDAMYCIN PHOSPHATE 600 MG/50ML IV SOLN
INTRAVENOUS | Status: AC
  Filled 2021-07-30: qty 50

## 2021-07-30 MED ORDER — PHENOL 1.4 % MT LIQD: 1.0000 | OROMUCOSAL | Status: DC | PRN

## 2021-07-30 MED ORDER — ACETAMINOPHEN 10 MG/ML IV SOLN
INTRAVENOUS | Status: AC
  Administered 2021-07-30: 1000 mg via INTRAVENOUS
  Filled 2021-07-30: qty 100

## 2021-07-30 MED ORDER — HYDROCODONE-ACETAMINOPHEN 7.5-325 MG PO TABS
1.0000 | ORAL_TABLET | Freq: Once | ORAL | Status: DC | PRN
  Filled 2021-07-30: qty 1

## 2021-07-30 SURGICAL SUPPLY — 52 items
BLADE SAGITTAL WIDE XTHICK NO (BLADE) ×2 IMPLANT
CHLORAPREP W/TINT 26 (MISCELLANEOUS) ×4 IMPLANT
COVER BACK TABLE REUSABLE LG (DRAPES) ×2 IMPLANT
DRAPE INCISE IOBAN 66X60 STRL (DRAPES) ×5 IMPLANT
DRSG AQUACEL AG ADV 3.5X10 (GAUZE/BANDAGES/DRESSINGS) ×2 IMPLANT
DRSG AQUACEL AG ADV 3.5X14 (GAUZE/BANDAGES/DRESSINGS) ×2 IMPLANT
ELECT CAUTERY BLADE 6.4 (BLADE) ×2 IMPLANT
ELECT REM PT RETURN 9FT ADLT (ELECTROSURGICAL) ×2
ELECTRODE REM PT RTRN 9FT ADLT (ELECTROSURGICAL) ×1 IMPLANT
GAUZE 4X4 16PLY ~~LOC~~+RFID DBL (SPONGE) ×2 IMPLANT
GAUZE SPONGE 4X4 12PLY STRL (GAUZE/BANDAGES/DRESSINGS) ×3 IMPLANT
GAUZE XEROFORM 1X8 LF (GAUZE/BANDAGES/DRESSINGS) ×3 IMPLANT
GLOVE SURG ORTHO LTX SZ8.5 (GLOVE) ×2 IMPLANT
GLOVE SURG UNDER LTX SZ8 (GLOVE) ×2 IMPLANT
GOWN STRL REUS W/ TWL LRG LVL3 (GOWN DISPOSABLE) ×2 IMPLANT
GOWN STRL REUS W/TWL LRG LVL3 (GOWN DISPOSABLE) ×2
GOWN STRL REUS W/TWL LRG LVL4 (GOWN DISPOSABLE) ×2 IMPLANT
HEAD MODULAR ENDO (Orthopedic Implant) ×1 IMPLANT
HEAD UNPLR 46XMDLR STRL HIP (Orthopedic Implant) IMPLANT
HEMOVAC 400CC 10FR (MISCELLANEOUS) ×2 IMPLANT
HOLSTER ELECTROSUGICAL PENCIL (MISCELLANEOUS) ×2 IMPLANT
IV NS 1000ML (IV SOLUTION) ×1
IV NS 1000ML BAXH (IV SOLUTION) ×1 IMPLANT
KIT TURNOVER KIT A (KITS) ×2 IMPLANT
MANIFOLD NEPTUNE II (INSTRUMENTS) ×2 IMPLANT
NDL FILTER BLUNT 18X1 1/2 (NEEDLE) ×1 IMPLANT
NDL SPNL 18GX3.5 QUINCKE PK (NEEDLE) ×2 IMPLANT
NEEDLE FILTER BLUNT 18X 1/2SAF (NEEDLE) ×1
NEEDLE FILTER BLUNT 18X1 1/2 (NEEDLE) ×1 IMPLANT
NEEDLE SPNL 18GX3.5 QUINCKE PK (NEEDLE) ×4 IMPLANT
NS IRRIG 1000ML POUR BTL (IV SOLUTION) ×2 IMPLANT
PACK HIP PROSTHESIS (MISCELLANEOUS) ×2 IMPLANT
PAD ABD DERMACEA PRESS 5X9 (GAUZE/BANDAGES/DRESSINGS) ×1 IMPLANT
PILLOW ABDUCTION FOAM SM (MISCELLANEOUS) ×1 IMPLANT
PULSAVAC PLUS IRRIG FAN TIP (DISPOSABLE) ×2
SLEEVE UNITRAX V40 STD (Orthopedic Implant) ×1 IMPLANT
SOL PREP PVP 2OZ (MISCELLANEOUS) ×2
SOLUTION PREP PVP 2OZ (MISCELLANEOUS) ×1 IMPLANT
SPONGE T-LAP 18X18 ~~LOC~~+RFID (SPONGE) ×10 IMPLANT
STAPLER SKIN PROX 35W (STAPLE) ×2 IMPLANT
STEM HIP 4 127DEG (Stem) ×1 IMPLANT
SUT DVC 2 QUILL PDO  T11 36X36 (SUTURE) ×2
SUT DVC 2 QUILL PDO T11 36X36 (SUTURE) ×2 IMPLANT
SUT QUILL PDO 0 36 36 VIOLET (SUTURE) ×2 IMPLANT
SUT TICRON 2-0 30IN 311381 (SUTURE) ×7 IMPLANT
SYR 10ML LL (SYRINGE) ×2 IMPLANT
SYR 30ML LL (SYRINGE) ×2 IMPLANT
SYR 50ML LL SCALE MARK (SYRINGE) ×2 IMPLANT
TAPE MICROFOAM 4IN (TAPE) ×2 IMPLANT
TIP FAN IRRIG PULSAVAC PLUS (DISPOSABLE) ×1 IMPLANT
TUBE SUCT KAM VAC (TUBING) ×2 IMPLANT
WATER STERILE IRR 1000ML POUR (IV SOLUTION) ×4 IMPLANT

## 2021-07-30 NOTE — Anesthesia Postprocedure Evaluation (Signed)
Anesthesia Post Note ? ?Patient: Jessica Long ? ?Procedure(s) Performed: ARTHROPLASTY BIPOLAR HIP (HEMIARTHROPLASTY) (Left: Hip) ? ?Patient location during evaluation: PACU ?Anesthesia Type: Combined General/Spinal ?Level of consciousness: awake and alert ?Pain management: pain level controlled ?Vital Signs Assessment: post-procedure vital signs reviewed and stable ?Respiratory status: spontaneous breathing, nonlabored ventilation, respiratory function stable and patient connected to nasal cannula oxygen ?Cardiovascular status: blood pressure returned to baseline and stable ?Postop Assessment: no apparent nausea or vomiting ?Anesthetic complications: no ? ? ?No notable events documented. ? ? ?Last Vitals:  ?Vitals:  ? 07/30/21 1630 07/30/21 1949  ?BP: 118/74 (!) 104/57  ?Pulse: 86 73  ?Resp: 20 20  ?Temp: 36.4 ?C 36.8 ?C  ?SpO2: 91% 92%  ?  ?Last Pain:  ?Vitals:  ? 07/30/21 1950  ?TempSrc:   ?PainSc: 9   ? ? ?  ?  ?  ?  ?  ?  ? ?Tonny Bollman ? ? ? ? ?

## 2021-07-30 NOTE — Consult Note (Signed)
?ORTHOPAEDIC CONSULTATION ? ?REQUESTING PHYSICIAN: Fritzi Mandes, MD ? ?Chief Complaint: Left hip pain ? ?HPI: ?Jessica Long is a 76 y.o. female who complains of left hip pain after a fall at her assisted living facility, [facility], last night.  She was brought to the emergency room where exam and x-rays revealed a displaced subcapital fracture of the left hip.  She was admitted for medical evaluation, stabilization, and surgery.  Her INR was elevated at 2.0 on admission but today is down to the normal range of 1.2.  She has been cleared for surgery.  The risks and benefits of surgery versus nonoperative treatment were discussed with the patient and she wished to proceed. ? ?Past Medical History:  ?Diagnosis Date  ? Allergic rhinitis   ? Aortic atherosclerosis ([place])   ? Atrial fibrillation ([place])   ? Breast cancer, right ([place])   ? CHF (congestive heart failure) ([place])   ? CKD (chronic kidney disease)   ? Current use of long term anticoagulation   ? Warfarin  ? Depression   ? History of kidney stones   ? Hyperlipemia   ? Osteoarthritis   ? Stroke [facility]) 2011  ? ?Past Surgical History:  ?Procedure Laterality Date  ? CYSTOSCOPY WITH URETEROSCOPY AND STENT PLACEMENT Left 09/03/2020  ? Procedure: CYSTOSCOPY  AND LEFT STENT PLACEMENT;  Surgeon: Ceasar Mons, MD;  Location: [facility] ORS;  Service: Urology;  Laterality: Left;  ? CYSTOSCOPY/URETEROSCOPY/HOLMIUM LASER/STENT PLACEMENT Left 10/25/2020  ? Procedure: CYSTOSCOPY/URETEROSCOPY/HOLMIUM LASER/STENT EXCHANGE;  Surgeon: Abbie Sons, MD;  Location: [facility] ORS;  Service: Urology;  Laterality: Left;  ? MASTECTOMY Right 2007  ? ?Social History  ? ?Socioeconomic History  ? Marital status: Widowed  ?  Spouse name: Not on file  ? Number of children: Not on file  ? Years of education: Not on file  ? Highest education level: Not on file  ?Occupational History  ? Not on file  ?Tobacco Use  ? Smoking status: Former  ?  Types: Cigarettes  ?  Quit date: 2011  ?  Years since  quitting: 12.2  ? Smokeless tobacco: Never  ?Vaping Use  ? Vaping Use: Never used  ?Substance and Sexual Activity  ? Alcohol use: Never  ? Drug use: Never  ? Sexual activity: Not Currently  ?Other Topics Concern  ? Not on file  ?Social History Narrative  ? Not on file  ? ?Social Determinants of Health  ? ?Financial Resource Strain: Not on file  ?Food Insecurity: Not on file  ?Transportation Needs: Not on file  ?Physical Activity: Not on file  ?Stress: Not on file  ?Social Connections: Not on file  ? ?Family History  ?Problem Relation Age of Onset  ? Heart disease Mother   ? Heart attack Father   ? Parkinson's disease Sister   ? Heart disease Sister   ? Heart attack Brother   ? COPD Sister   ? ?Allergies  ?Allergen Reactions  ? Amoxicillin Swelling  ?  Oral swelling  ? Ampicillin Swelling  ?  Oral swelling  ? ?Prior to Admission medications   ?Medication Sig Start Date End Date Taking? Authorizing Provider  ?acetaminophen (TYLENOL) 500 MG tablet Take 1,000 mg by mouth 2 (two) times daily.   Yes [provider]  ?buPROPion (WELLBUTRIN XL) 300 MG 24 hr tablet Take 300 mg by mouth daily.   Yes [provider]  ?cetirizine (ZYRTEC) 10 MG tablet Take 10 mg by mouth daily.   Yes [provider]  ?  Cholecalciferol 125 MCG (5000 UT) capsule Take 5,000 Units by mouth daily.   Yes [provider]  ?famotidine (PEPCID) 10 MG tablet Take 1 tablet (10 mg total) by mouth at bedtime. 09/06/20  Yes Enzo Bi, MD  ?fluticasone Asencion Islam) 50 MCG/ACT nasal spray Place 1 spray into both nostrils daily.   Yes [provider]  ?metoprolol tartrate (LOPRESSOR) 25 MG tablet Take 0.5 tablets (12.5 mg total) by mouth 2 (two) times daily. 09/06/20  Yes Enzo Bi, MD  ?midodrine (PROAMATINE) 5 MG tablet Take 5 mg by mouth 3 (three) times daily. 07/13/21  Yes [provider]  ?MULTIPLE VITAMIN PO Take 1 tablet by mouth daily.   Yes [provider]  ?omeprazole (PRILOSEC) 20 MG capsule Take  20 mg by mouth daily. 07/13/21  Yes [provider]  ?potassium citrate (UROCIT-K) 10 MEQ (1080 MG) SR tablet Take 10 mEq by mouth 2 (two) times daily. 07/03/21  Yes [provider]  ?selenium sulfide (SELSUN) 1 % LOTN Apply 1 application. topically as directed. Every Monday and Friday for dry scalp.   Yes [provider]  ?simvastatin (ZOCOR) 20 MG tablet Take 20 mg by mouth at bedtime. 07/03/21  Yes [provider]  ?tamsulosin (FLOMAX) 0.4 MG CAPS capsule Take 1 capsule (0.4 mg total) by mouth daily. 09/06/20  Yes Enzo Bi, MD  ?torsemide (DEMADEX) 20 MG tablet Take 20 mg by mouth daily. 07/03/21  Yes [provider]  ?vitamin B-12 (CYANOCOBALAMIN) 250 MCG tablet Take 250 mcg by mouth daily.   Yes [provider]  ?warfarin (COUMADIN) 2.5 MG tablet Take 2.5 mg by mouth daily.   Yes [provider]  ?zolpidem (AMBIEN) 5 MG tablet Take 0.5 tablets (2.5 mg total) by mouth at bedtime as needed for sleep. 09/06/20  Yes Enzo Bi, MD  ?polyethylene glycol (MIRALAX / GLYCOLAX) 17 g packet Take 34 g by mouth daily as needed. 09/06/20   Enzo Bi, MD  ?[employer] HFA [id number] Base) MCG/ACT inhaler Inhale 2 puffs into the lungs every 4 (four) hours as needed. 07/13/21   [provider]  ?sodium chloride (OCEAN) 0.65 % SOLN nasal spray Place 2 sprays into both nostrils as needed for congestion.    [provider]  ? ?DG Chest 1 View ? ?Result Date: 07/29/2021 ?CLINICAL DATA:  [place] on the left side EXAM: CHEST  1 VIEW COMPARISON:  07/23/2021 FINDINGS: Single frontal view of the chest demonstrates a stable cardiac silhouette. No airspace disease, effusion, or pneumothorax. No acute bony abnormality. IMPRESSION: 1. No acute intrathoracic process. Electronically Signed   By: Randa Ngo M.D.   On: 07/29/2021 16:30  ? ?DG Pelvis 1-2 Views ? ?Result Date: 07/29/2021 ?CLINICAL DATA:  Fell, pain EXAM: PELVIS - 1-2 VIEW COMPARISON:  None. FINDINGS: Single frontal view  of the pelvis demonstrates a subcapital left femoral neck fracture with impaction and varus angulation at the fracture site. No dislocation. The right hip is unremarkable. The remainder of the bony pelvis is normal. IMPRESSION: 1. Impacted subcapital left femoral neck fracture. Electronically Signed   By: Randa Ngo M.D.   On: 07/29/2021 16:27  ? ?CT Head Wo Contrast ? ?Result Date: 07/29/2021 ?CLINICAL DATA:  [place], head trauma EXAM: CT HEAD WITHOUT CONTRAST TECHNIQUE: Contiguous axial images were obtained from the base of the skull through the vertex without intravenous contrast. RADIATION DOSE REDUCTION: This exam was performed according to the departmental dose-optimization program which includes automated exposure control, adjustment of the  mA and/or kV according to patient size and/or use of iterative reconstruction technique. COMPARISON:  06/10/2014 FINDINGS: Brain: No acute infarct or hemorrhage. Focal hypodensities in the left frontal periventricular white matter consistent with chronic small vessel ischemic change. Lateral ventricles are unremarkable. 4 mm hyperdense region in the third ventricle reference image 20/2 compatible with colloid cyst. Previously this had measured up to 13 mm. No evidence of mass effect or hydrocephalus. No acute extra-axial fluid collections. No mass effect. Vascular: No hyperdense vessel or unexpected calcification. Skull: Normal. Negative for fracture or focal lesion. Sinuses/Orbits: There is diffuse mucosal thickening with partial opacification of the frontal, ethmoid, maxillary sinuses. Small gas fluid levels are seen within the maxillary sinuses bilaterally. Other: None. IMPRESSION: 1. No acute intracranial process. 2. 4 mm hyperdense colloid cyst, decreased in size since prior study where this measured up to 13 mm. 3. Diffuse paranasal sinus disease. Electronically Signed   By: Randa Ngo M.D.   On: 07/29/2021 16:12  ? ?CT Cervical Spine Wo Contrast ? ?Result Date:  07/29/2021 ?CLINICAL DATA:  [place], head trauma EXAM: CT CERVICAL SPINE WITHOUT CONTRAST TECHNIQUE: Multidetector CT imaging of the cervical spine was performed without intravenous contrast. Multiplanar CT

## 2021-07-30 NOTE — Transfer of Care (Signed)
Immediate Anesthesia Transfer of Care Note ? ?Patient: Jessica Long ? ?Procedure(s) Performed: ARTHROPLASTY BIPOLAR HIP (HEMIARTHROPLASTY) (Left: Hip) ? ?Patient Location: PACU ? ?Anesthesia Type:General and Spinal ? ?Level of Consciousness: awake, alert  and oriented ? ?Airway & Oxygen Therapy: Patient Spontanous Breathing and Patient connected to face mask oxygen ? ?Post-op Assessment: Report given to RN and Post -op Vital signs reviewed and stable ? ?Post vital signs: Reviewed and stable ? ?Last Vitals:  ?Vitals Value Taken Time  ?BP 96/63 07/30/21 1115  ?Temp    ?Pulse 81 07/30/21 1115  ?Resp 19 07/30/21 1115  ?SpO2 98 % 07/30/21 1115  ? ? ?Last Pain:  ?Vitals:  ? 07/30/21 0523  ?TempSrc: Oral  ?PainSc: Asleep  ?   ? ?  ? ?Complications: No notable events documented. ?

## 2021-07-30 NOTE — Op Note (Signed)
07/30/2021 ? ?11:08 AM ? ?PATIENT:  Jessica Long  ? ?MRN: [id number] ? ?PRE-OPERATIVE DIAGNOSIS:  Displaced Subcapital fracture left hip  ? ?POST-OPERATIVE DIAGNOSIS: Same ? ?PROCEDURE: Left   hip hemiarthroplasty with [employer] Accolade prosthesis ? ?PREOPERATIVE INDICATIONS:  Jessica Long is an 77 y.o. female who was admitted 07/29/2021 with a diagnosis of displaced subcapital fracture of the hip and elected for surgical management.  The risks benefits and alternatives were discussed with the patient including but not limited to the risks of nonoperative treatment, versus surgical intervention including infection, bleeding, nerve injury, periprosthetic fracture, the need for revision surgery, dislocation, leg length discrepancy, blood clots, cardiopulmonary complications, morbidity, mortality, among others, and they were willing to proceed.  Predicted outcome is good, although there will be at least a six to nine month expected recovery.  ?   ?SURGEON:  Earnestine Leys, MD ? ?ASST: ?   ?ANESTHESIA: Spinal ?   ?COMPLICATIONS:  None.  ? ?EBL: 50 cc ?   ?COMPONENTS:  [employer] Accolade Femoral Fracture stem size #4,   and a size   46 mm  fracture head unipolar hip ball with    standard mm  neck length. ?   ?PROCEDURE IN DETAIL: The patient was met in the holding area and identified.  The appropriate hip  was marked at the operative site. The patient was then transported to the OR and  placed under general anesthesia.  At that point, the patient was  placed in the lateral decubitus position with the operative side up and  secured to the operating room table and all bony prominences padded.  ?   ?The operative lower extremity was prepped from the iliac crest to the toes.  Sterile draping was performed.  Time out was performed prior to incision.   ?   ?A routine posterolateral approach was utilized via sharp dissection  carried down to the subcutaneous tissue.  Gross bleeders were Bovie  coagulated.  The iliotibial band was  identified and incised  along the length of the skin incision.  Self-retaining retractors were  inserted.  With the hip internally rotated, the short external rotators  were identified. The piriformis was tagged and the hip capsule released in a T-type fashion. ? ?The femoral neck was exposed, and I resected the femoral neck using the appropriate jig. This was performed at approximately a thumb's breadth above the lesser trochanter. ?   ?I then exposed the deep acetabulum, cleared out any tissue including the ligamentum teres. ?   ?I then prepared the proximal femur using the cookie-cutter, the lateralizing reamer, and then sequentially broached. ? ?A trial stem   was  utilized along with a unipolar head and neck.  I reduced the hip and it was found to have excellent stability with functional range of motion. Leg lengths were equal.  The trial components were then removed.  ? ?The same size Accolade femoral stem was then inserted and was very stable.  The Unitrax head and neck as trialed were inserted as well.   ? ? The hip was then reduced and taken through functional range of motion and found to have excellent stability. Leg lengths were restored.  ? ?  I closed the T in the capsule with #2 Ticron as well as the short external rotators. A hemovac was inserted.   ? ?I then irrigated the hip copiously again with pulse lavage, and repaired the fascia with #2 Quill and the subcutaneous layer with #0 Quill. Sponge  and needle counts were correct. Dry sterile Aquacell was applied.   The patient was then awakened and returned to PACU in stable and satisfactory condition. There were no complications. ? ?Park Breed, MD ?Orthopedic Surgeon ?[phone]  ? ?07/30/2021 11:08 AM ? ? ?  ?

## 2021-07-30 NOTE — Consult Note (Signed)
?CARDIOLOGY CONSULT NOTE  ? ?   ?    ?  ? ? ? ?Patient ID: ?Jessica Long ?MRN: [id number] ?DOB/AGE: 06-18-1945 76 y.o. ? ?Admit date: 07/29/2021 ?Referring Physician Dr Ivor Costa hospitalist ?Primary Physician Drs. Making housecalls ?Primary Cardiologist  ?Reason for Consultation preop clearance for orthopedic procedure history of atrial fibrillation history of congestive heart failure ? ?HPI: 76 year old white female significative diastolic congestive heart failure compensated atrial fibrillation chronic rate control with metoprolol Coumadin for anticoagulation hypertension COPD previous CVA depression chronic renal sufficiency previous breast cancer chronic hyponatremia had an accidental fall at home suffered a left hip fracture patient now preop for surgery still has significant pain no significant other cardiac history except for atrial fibrillation denies any chest pain no recent shortness of breath no heart failure symptoms. ? ?Review of systems complete and found to be negative unless listed above  ? ? ? ?Past Medical History:  ?Diagnosis Date  ? Allergic rhinitis   ? Aortic atherosclerosis ([place])   ? Atrial fibrillation ([place])   ? Breast cancer, right ([place])   ? CHF (congestive heart failure) ([employer])   ? CKD (chronic kidney disease)   ? Current use of long term anticoagulation   ? Warfarin  ? Depression   ? History of kidney stones   ? Hyperlipemia   ? Osteoarthritis   ? Stroke [facility]) 2011  ?  ?Past Surgical History:  ?Procedure Laterality Date  ? CYSTOSCOPY WITH URETEROSCOPY AND STENT PLACEMENT Left 09/03/2020  ? Procedure: CYSTOSCOPY  AND LEFT STENT PLACEMENT;  Surgeon: Ceasar Mons, MD;  Location: [facility] ORS;  Service: Urology;  Laterality: Left;  ? CYSTOSCOPY/URETEROSCOPY/HOLMIUM LASER/STENT PLACEMENT Left 10/25/2020  ? Procedure: CYSTOSCOPY/URETEROSCOPY/HOLMIUM LASER/STENT EXCHANGE;  Surgeon: Abbie Sons, MD;  Location: [facility] ORS;  Service: Urology;  Laterality: Left;  ? MASTECTOMY Right 2007  ?   ?Medications Prior to Admission  ?Medication Sig Dispense Refill Last Dose  ? acetaminophen (TYLENOL) 500 MG tablet Take 1,000 mg by mouth 2 (two) times daily.   07/29/2021 at 0900  ? buPROPion (WELLBUTRIN XL) 300 MG 24 hr tablet Take 300 mg by mouth daily.   07/29/2021 at 0900  ? cetirizine (ZYRTEC) 10 MG tablet Take 10 mg by mouth daily.   07/29/2021 at 0900  ? Cholecalciferol 125 MCG (5000 UT) capsule Take 5,000 Units by mouth daily.   07/29/2021 at 0900  ? famotidine (PEPCID) 10 MG tablet Take 1 tablet (10 mg total) by mouth at bedtime.   07/28/2021 at 2000  ? fluticasone (FLONASE) 50 MCG/ACT nasal spray Place 1 spray into both nostrils daily.   07/29/2021 at 0800z  ? metoprolol tartrate (LOPRESSOR) 25 MG tablet Take 0.5 tablets (12.5 mg total) by mouth 2 (two) times daily.   07/29/2021 at 0900  ? midodrine (PROAMATINE) 5 MG tablet Take 5 mg by mouth 3 (three) times daily.   07/29/2021 at 1400  ? MULTIPLE VITAMIN PO Take 1 tablet by mouth daily.   07/29/2021 at 0900  ? omeprazole (PRILOSEC) 20 MG capsule Take 20 mg by mouth daily.   07/29/2021 at 0600  ? potassium citrate (UROCIT-K) 10 MEQ (1080 MG) SR tablet Take 10 mEq by mouth 2 (two) times daily.   07/29/2021 at 0600  ? selenium sulfide (SELSUN) 1 % LOTN Apply 1 application. topically as directed. Every Monday and Friday for dry scalp.   Past Week  ? simvastatin (ZOCOR) 20 MG tablet Take 20 mg by mouth at bedtime.   07/28/2021 at  2000  ? tamsulosin (FLOMAX) 0.4 MG CAPS capsule Take 1 capsule (0.4 mg total) by mouth daily. 30 capsule  07/29/2021 at 0900  ? torsemide (DEMADEX) 20 MG tablet Take 20 mg by mouth daily.   07/29/2021 at 0900  ? vitamin B-12 (CYANOCOBALAMIN) 250 MCG tablet Take 250 mcg by mouth daily.   07/29/2021 at 0900  ? warfarin (COUMADIN) 2.5 MG tablet Take 2.5 mg by mouth daily.   07/28/2021 at 1800  ? zolpidem (AMBIEN) 5 MG tablet Take 0.5 tablets (2.5 mg total) by mouth at bedtime as needed for sleep. 30 tablet 0 07/28/2021 at 0000  ? polyethylene glycol  (MIRALAX / GLYCOLAX) 17 g packet Take 34 g by mouth daily as needed.  0 prn  ? PROAIR HFA 108 (90 Base) MCG/ACT inhaler Inhale 2 puffs into the lungs every 4 (four) hours as needed.   prn  ? sodium chloride (OCEAN) 0.65 % SOLN nasal spray Place 2 sprays into both nostrils as needed for congestion.   prn  ? ?Social History  ? ?Socioeconomic History  ? Marital status: Widowed  ?  Spouse name: Not on file  ? Number of children: Not on file  ? Years of education: Not on file  ? Highest education level: Not on file  ?Occupational History  ? Not on file  ?Tobacco Use  ? Smoking status: Former  ?  Types: Cigarettes  ?  Quit date: 2011  ?  Years since quitting: 12.2  ? Smokeless tobacco: Never  ?Vaping Use  ? Vaping Use: Never used  ?Substance and Sexual Activity  ? Alcohol use: Never  ? Drug use: Never  ? Sexual activity: Not Currently  ?Other Topics Concern  ? Not on file  ?Social History Narrative  ? Not on file  ? ?Social Determinants of Health  ? ?Financial Resource Strain: Not on file  ?Food Insecurity: Not on file  ?Transportation Needs: Not on file  ?Physical Activity: Not on file  ?Stress: Not on file  ?Social Connections: Not on file  ?Intimate Partner Violence: Not on file  ?  ?Family History  ?Problem Relation Age of Onset  ? Heart disease Mother   ? Heart attack Father   ? Parkinson's disease Sister   ? Heart disease Sister   ? Heart attack Brother   ? COPD Sister   ?  ? ? ?Review of systems complete and found to be negative unless listed above  ? ? ? ? ?PHYSICAL EXAM ? ?General: Well developed, well nourished, in no acute distress ?HEENT:  Normocephalic and atramatic ?Neck:  No JVD.  ?Lungs: Clear bilaterally to auscultation and percussion. ?Heart: Irregular irregular. Normal S1 and S2 without gallops or murmurs.  ?Abdomen: Bowel sounds are positive, abdomen soft and non-tender  ?Msk:  Back normal, normal gait. Normal strength and tone for age. ?Extremities: No clubbing, cyanosis or edema.  Deformed left  leg ?Neuro: Alert and oriented X 3. ?Psych:  Good affect, responds appropriately ? ?Labs: ?  ?Lab Results  ?Component Value Date  ? WBC 7.6 07/30/2021  ? HGB 13.5 07/30/2021  ? HCT 39.8 07/30/2021  ? MCV 89.8 07/30/2021  ? PLT 241 07/30/2021  ?  ?Recent Labs  ?Lab 07/29/21 ?1527 07/30/21 ?0444  ?NA 129* 128*  ?K 3.6 4.2  ?CL 93* 95*  ?CO2 25 25  ?BUN 14 15  ?CREATININE 1.38* 1.06*  ?CALCIUM 8.9 9.1  ?PROT 6.4*  --   ?BILITOT 0.9  --   ?ALKPHOS 49  --   ?  ALT 27  --   ?AST 26  --   ?GLUCOSE 116* 165*  ? ?Lab Results  ?Component Value Date  ? TROPONINI < 0.02 06/10/2014  ?  ?Lab Results  ?Component Value Date  ? CHOL  07/24/2009  ?  185        ?ATP III CLASSIFICATION: ? <200     mg/dL   Desirable ? 200-239  mg/dL   Borderline High ? >=240    mg/dL   High ?        ? ?Lab Results  ?Component Value Date  ? HDL 62 07/24/2009  ? ?Lab Results  ?Component Value Date  ? [place] (H) 07/24/2009  ?  108        ?Total Cholesterol/HDL:CHD Risk ?Coronary Heart Disease Risk Table ?                    Men   Women ? 1/2 Average Risk   3.4   3.3 ? Average Risk       5.0   4.4 ? 2 X Average Risk   9.6   7.1 ? 3 X Average Risk  23.4   11.0 ?       ?Use the calculated Patient Ratio ?above and the CHD Risk Table ?to determine the patient's CHD Risk. ?       ?ATP III CLASSIFICATION (LDL): ? <100     mg/dL   Optimal ? 100-129  mg/dL   Near or Above ?                   Optimal ? 130-159  mg/dL   Borderline ? 160-189  mg/dL   High ? >190     mg/dL   Very High  ? ?Lab Results  ?Component Value Date  ? TRIG 73 07/24/2009  ? ?Lab Results  ?Component Value Date  ? CHOLHDL 3.0 07/24/2009  ? ?No results found for: LDLDIRECT  ?  ?Radiology: DG Chest 1 View ? ?Result Date: 07/29/2021 ?CLINICAL DATA:  [place] on the left side EXAM: CHEST  1 VIEW COMPARISON:  07/23/2021 FINDINGS: Single frontal view of the chest demonstrates a stable cardiac silhouette. No airspace disease, effusion, or pneumothorax. No acute bony abnormality. IMPRESSION: 1. No acute  intrathoracic process. Electronically Signed   By: Randa Ngo M.D.   On: 07/29/2021 16:30  ? ?DG Pelvis 1-2 Views ? ?Result Date: 07/29/2021 ?CLINICAL DATA:  Fell, pain EXAM: PELVIS - 1-2 VIEW COMPARISON:  None.

## 2021-07-30 NOTE — H&P (Signed)
THE PATIENT WAS SEEN PRIOR TO SURGERY TODAY.  HISTORY, ALLERGIES, HOME MEDICATIONS AND OPERATIVE PROCEDURE WERE REVIEWED. RISKS AND BENEFITS OF SURGERY DISCUSSED WITH PATIENT AGAIN.  NO CHANGES FROM INITIAL HISTORY AND PHYSICAL NOTED.    

## 2021-07-30 NOTE — Anesthesia Procedure Notes (Signed)
Spinal ? ?Patient location during procedure: OR ?Start time: 07/30/2021 9:05 AM ?End time: 07/30/2021 9:17 AM ?Reason for block: surgical anesthesia ?Staffing ?Performed: resident/CRNA and anesthesiologist  ?Anesthesiologist: Tonny Bollman, MD ?Resident/CRNA: Esaw Grandchild, CRNA ?Preanesthetic Checklist ?Completed: patient identified, IV checked, site marked, risks and benefits discussed, surgical consent, monitors and equipment checked, pre-op evaluation and timeout performed ?Spinal Block ?Patient position: right lateral decubitus ?Prep: ChloraPrep ?Patient monitoring: heart rate, continuous pulse ox and blood pressure ?Approach: midline ?Location: L3-4 ?Injection technique: single-shot ?Needle ?Needle type: Pencan  ?Needle gauge: 25 G ?Needle length: 9 cm ?Assessment ?Sensory level: T4 ?Events: CSF return ? ? ? ?

## 2021-07-30 NOTE — Consult Note (Signed)
ANTICOAGULATION CONSULT NOTE - Initial Consult ? ?Pharmacy Consult for warfarin ?Indication: atrial fibrillation ? ?Allergies  ?Allergen Reactions  ? Amoxicillin Swelling  ?  Oral swelling  ? Ampicillin Swelling  ?  Oral swelling  ? Ketamine Hcl Anxiety  ? ? ?Patient Measurements: ?Height: '5\' 8"'$  (172.7 cm) ?Weight: 77.1 kg (170 lb) ?IBW/kg (Calculated) : 63.9 ? ? ?Vital Signs: ?Temp: 96.9 ?F (36.1 ?C) (03/26 1200) ?Temp Source: Oral (03/26 4782) ?BP: 153/86 (03/26 1245) ?Pulse Rate: 81 (03/26 1245) ? ?Labs: ?Recent Labs  ?  07/29/21 ?1527 07/30/21 ?0444 07/30/21 ?1319  ?HGB 13.2 13.5 12.6  ?HCT 38.9 39.8 37.8  ?PLT 262 241 250  ?APTT 34  --   --   ?LABPROT 22.7* 15.3*  --   ?INR 2.0* 1.2  --   ?CREATININE 1.38* 1.06*  --   ? ? ?Estimated Creatinine Clearance: 48.6 mL/min (A) (by C-G formula based on SCr of 1.06 mg/dL (H)). ? ? ?Medical History: ?Past Medical History:  ?Diagnosis Date  ? Allergic rhinitis   ? Aortic atherosclerosis ([place])   ? Atrial fibrillation ([place])   ? Breast cancer, right ([place])   ? CHF (congestive heart failure) ([place])   ? CKD (chronic kidney disease)   ? Current use of long term anticoagulation   ? Warfarin  ? Depression   ? History of kidney stones   ? Hyperlipemia   ? Osteoarthritis   ? Stroke [facility]) 2011  ? ? ?Medications:  ?PTA warfarin reg: 2.5 mg daily ?3/25 IV Vitamin K 10 mg x 1 given  ? ?DDI: doxycycline  ? ?Assessment: ?76 y.o. female with medical history significant of dCHF, A fib on Coumadin regimen as above presented to ED 07/29/21 with left hip fracture following a fall. INR 2.0 (therapeutic) on admission. Patient given 10 mg IV vitamin K for reversal and underwent hip hemiarthroplasty 07/30/21. Pharmacy consulted to restart warfarin therapy post-op. ? ?Date INR Warfarin Dose ?3/25 2.0 Held: IV Vitamin K 10 mg given  ?3/26 1.2 5 mg  ? ?Goal of Therapy:  ?INR 2-3 ?Monitor platelets by anticoagulation protocol: Yes ?  ?Plan:  ?Warfarin 2.5 mg x 1 tonight ?Anticipate INR resistance due to  Vitamin K dose given 07/30/21 ?No bridge per MD ?INR daily ? ?Dorothe Pea, PharmD, BCPS ?Clinical Pharmacist   ?07/30/2021,1:57 PM ? ? ?

## 2021-07-30 NOTE — Progress Notes (Signed)
[place] at [facility] ? ? ?PATIENT NAME: Jessica Long   ? ?MR#:  [id number] ? ?DATE OF BIRTH:  06-03-1945 ? ?SUBJECTIVE:  ?came in from [facility] assisted living after mechanical fall. Seen after surgery in the room. Nice at bedside. Patient was a bit anxious earlier now settle down no wheezing able to speak sentence without shortness of breath and wheezing. Sats 97% on room air. ?Complains of post hip surgical pain. ? ?VITALS:  ?Blood pressure (!) 153/86, pulse 81, temperature (!) 96.9 ?F (36.1 ?C), resp. rate 18, height '5\' 8"'$  (1.727 m), weight 77.1 kg, SpO2 94 %. ? ?PHYSICAL EXAMINATION:  ? ?GENERAL:  76 y.o.-year-old patient lying in the bed with no acute distress.  ?LUNGS: decreased breath sounds bilaterally, no wheezing, rales, rhonchi.  ?CARDIOVASCULAR: S1, S2 normal. No murmurs, rubs, or gallops.  ?ABDOMEN: Soft, nontender, nondistended. Bowel sounds present.  ?EXTREMITIES: No  edema b/l.    ?NEUROLOGIC: nonfocal  patient is alert and awake ?SKIN: No obvious rash, lesion, or ulcer.  ? ?LABORATORY PANEL:  ?CBC ?Recent Labs  ?Lab 07/30/21 ?1319  ?WBC 10.6*  ?HGB 12.6  ?HCT 37.8  ?PLT 250  ? ? ?Chemistries  ?Recent Labs  ?Lab 07/29/21 ?1527 07/30/21 ?0444 07/30/21 ?1319  ?NA 129* 128*  --   ?K 3.6 4.2  --   ?CL 93* 95*  --   ?CO2 25 25  --   ?GLUCOSE 116* 165*  --   ?BUN 14 15  --   ?CREATININE 1.38* 1.06* 1.00  ?CALCIUM 8.9 9.1  --   ?AST 26  --   --   ?ALT 27  --   --   ?ALKPHOS 49  --   --   ?BILITOT 0.9  --   --   ? ?Cardiac Enzymes ?No results for input(s): TROPONINI in the last 168 hours. ?RADIOLOGY:  ?DG Chest 1 View ? ?Result Date: 07/29/2021 ?CLINICAL DATA:  [place] on the left side EXAM: CHEST  1 VIEW COMPARISON:  07/23/2021 FINDINGS: Single frontal view of the chest demonstrates a stable cardiac silhouette. No airspace disease, effusion, or pneumothorax. No acute bony abnormality. IMPRESSION: 1. No acute intrathoracic process. Electronically Signed   By: Randa Ngo M.D.    On: 07/29/2021 16:30  ? ?DG Pelvis 1-2 Views ? ?Result Date: 07/29/2021 ?CLINICAL DATA:  Fell, pain EXAM: PELVIS - 1-2 VIEW COMPARISON:  None. FINDINGS: Single frontal view of the pelvis demonstrates a subcapital left femoral neck fracture with impaction and varus angulation at the fracture site. No dislocation. The right hip is unremarkable. The remainder of the bony pelvis is normal. IMPRESSION: 1. Impacted subcapital left femoral neck fracture. Electronically Signed   By: Randa Ngo M.D.   On: 07/29/2021 16:27  ? ?CT Head Wo Contrast ? ?Result Date: 07/29/2021 ?CLINICAL DATA:  [place], head trauma EXAM: CT HEAD WITHOUT CONTRAST TECHNIQUE: Contiguous axial images were obtained from the base of the skull through the vertex without intravenous contrast. RADIATION DOSE REDUCTION: This exam was performed according to the departmental dose-optimization program which includes automated exposure control, adjustment of the mA and/or kV according to patient size and/or use of iterative reconstruction technique. COMPARISON:  06/10/2014 FINDINGS: Brain: No acute infarct or hemorrhage. Focal hypodensities in the left frontal periventricular white matter consistent with chronic small vessel ischemic change. Lateral ventricles are unremarkable. 4 mm hyperdense region in the third ventricle reference image 20/2 compatible with colloid cyst. Previously this had measured up to 13 mm.  No evidence of mass effect or hydrocephalus. No acute extra-axial fluid collections. No mass effect. Vascular: No hyperdense vessel or unexpected calcification. Skull: Normal. Negative for fracture or focal lesion. Sinuses/Orbits: There is diffuse mucosal thickening with partial opacification of the frontal, ethmoid, maxillary sinuses. Small gas fluid levels are seen within the maxillary sinuses bilaterally. Other: None. IMPRESSION: 1. No acute intracranial process. 2. 4 mm hyperdense colloid cyst, decreased in size since prior study where this  measured up to 13 mm. 3. Diffuse paranasal sinus disease. Electronically Signed   By: Randa Ngo M.D.   On: 07/29/2021 16:12  ? ?CT Cervical Spine Wo Contrast ? ?Result Date: 07/29/2021 ?CLINICAL DATA:  [place], head trauma EXAM: CT CERVICAL SPINE WITHOUT CONTRAST TECHNIQUE: Multidetector CT imaging of the cervical spine was performed without intravenous contrast. Multiplanar CT image reconstructions were also generated. RADIATION DOSE REDUCTION: This exam was performed according to the departmental dose-optimization program which includes automated exposure control, adjustment of the mA and/or kV according to patient size and/or use of iterative reconstruction technique. COMPARISON:  None. FINDINGS: Alignment: There is straightening of the lower cervical spine likely due to multilevel spondylosis. Otherwise alignment is anatomic. Skull base and vertebrae: No acute fracture. No primary bone lesion or focal pathologic process. Soft tissues and spinal canal: No prevertebral fluid or swelling. No visible canal hematoma. Disc levels: Multilevel spondylosis and facet hypertrophy. There is bony fusion across the C2-3 facet joints. Prominent facet hypertrophic changes are seen at C3-4 and C4-5. There is partial bony fusion across the C6-7 disc space. Prominent spondylosis at C5-6. Upper chest: Airway is patent. Patchy dependent ground-glass airspace disease within the left upper lobe could be hypoventilatory. Other: Reconstructed images demonstrate no additional findings. IMPRESSION: 1. No acute cervical spine fracture. 2. Multilevel spondylosis and facet hypertrophy. 3. Dependent left upper lobe ground-glass consolidation, which could be related to hypoventilatory change or developing airspace disease. Electronically Signed   By: Randa Ngo M.D.   On: 07/29/2021 16:09  ? ?DG Shoulder Left ? ?Result Date: 07/29/2021 ?CLINICAL DATA:  Fell, pain EXAM: LEFT SHOULDER - 2+ VIEW COMPARISON:  None. FINDINGS: Frontal and  transscapular views of the left shoulder are obtained. No fracture, subluxation, or dislocation. Mild glenohumeral and acromioclavicular joint osteoarthritis. Visualized portions of the left chest are clear. IMPRESSION: 1. Osteoarthritis.  No acute fracture. Electronically Signed   By: Randa Ngo M.D.   On: 07/29/2021 16:27  ? ?DG HIP UNILAT WITH PELVIS 2-3 VIEWS LEFT ? ?Result Date: 07/30/2021 ?CLINICAL DATA:  Status post left hip arthroplasty. EXAM: DG HIP (WITH OR WITHOUT PELVIS) 2-3V LEFT COMPARISON:  07/29/2021. FINDINGS: New left hip hemiarthroplasty appears well seated and aligned. No acute fracture or evidence of an operative complication. IMPRESSION: Well-positioned left hip hemiarthroplasty. Electronically Signed   By: Lajean Manes M.D.   On: 07/30/2021 12:17  ? ?DG FEMUR MIN 2 VIEWS LEFT ? ?Result Date: 07/29/2021 ?CLINICAL DATA:  Fell, pain EXAM: LEFT FEMUR 2 VIEWS COMPARISON:  None. FINDINGS: Frontal and cross-table lateral views of the left femur are obtained. There is a subcapital left femoral neck fracture with impaction and varus angulation at the fracture site. No other acute bony abnormalities. Alignment of the left knee is anatomic. IMPRESSION: 1. Subcapital left femoral neck fracture with impaction and varus angulation. Electronically Signed   By: Randa Ngo M.D.   On: 07/29/2021 16:28   ? ?Assessment and Plan ? ITXEL WICKARD is a 76 y.o. female with medical history significant of  dCHF, A fib on Coumadin, HTL, COPD, stroke, depression, CKD-3A, breast cancer, kidney stone, chronic hyponatremia, who presents with fall ?Pt states that she accidentally lost her balance and fell in the facility.  She fell on her left side, injured her left hip and left shoulder. ? ? X-ray of left shoulder is negative.  ? X-ray of pelvis and left femur showed subcapital left femoral neck fracture with impaction and varus angulation. ? ?Close left hip fracture status post mechanical fall ?-- POD 0 left hip hemi  arthroplasty ?-- orthopedic consultation with Dr. Sabra Heck appreciated ?-- okay to resume Coumadin tonight per Dr. Sabra Heck ?-- PRN pain meds ?-- bowel regimen ?-- PT OT and TOC for discharge planning ? ?chronic hypon

## 2021-07-30 NOTE — Progress Notes (Signed)
[facility] Cardiology ? ? ? ?SUBJECTIVE: Feels reasonably well.  Preop surgery.  Patient stable. ? ? ?Vitals:  ? 07/30/21 0248 07/30/21 2035 07/30/21 0523 07/30/21 0715  ?BP: 128/68 (!) 144/80 (!) 144/80 (!) 149/111  ?Pulse: 78 77 77 77  ?Resp: '16 16 16 16  '$ ?Temp: (!) 97.5 ?F (36.4 ?C) 98.5 ?F (36.9 ?C) 98.5 ?F (36.9 ?C) (!) 97.5 ?F (36.4 ?C)  ?TempSrc: Oral  Oral   ?SpO2: 96% 96% 97% 98%  ?Weight:      ?Height:      ? ? ? ?Intake/Output Summary (Last 24 hours) at 07/30/2021 0932 ?Last data filed at 07/30/2021 0300 ?Gross per 24 hour  ?Intake 305.48 ml  ?Output --  ?Net 305.48 ml  ? ? ? ? ?PHYSICAL EXAM ? ?General: Well developed, well nourished, in no acute distress ?HEENT:  Normocephalic and atramatic ?Neck:  No JVD.  ?Lungs: Clear bilaterally to auscultation and percussion. ?Heart: Irregularly irregular. Normal S1 and S2 without gallops or murmurs.  ?Abdomen: Bowel sounds are positive, abdomen soft and non-tender  ?Msk:  Back normal, normal gait. Normal strength and tone for age. ?Extremities: No clubbing, cyanosis or edema.   ?Neuro: Alert and oriented X 3. ?Psych:  Good affect, responds appropriately ? ? ?LABS: ?Basic Metabolic Panel: ?Recent Labs  ?  07/29/21 ?1527 07/30/21 ?0444  ?NA 129* 128*  ?K 3.6 4.2  ?CL 93* 95*  ?CO2 25 25  ?GLUCOSE 116* 165*  ?BUN 14 15  ?CREATININE 1.38* 1.06*  ?CALCIUM 8.9 9.1  ? ?Liver Function Tests: ?Recent Labs  ?  07/29/21 ?1527  ?AST 26  ?ALT 27  ?ALKPHOS 49  ?BILITOT 0.9  ?PROT 6.4*  ?ALBUMIN 3.6  ? ?No results for input(s): LIPASE, AMYLASE in the last 72 hours. ?CBC: ?Recent Labs  ?  07/29/21 ?1527 07/30/21 ?0444  ?WBC 7.8 7.6  ?NEUTROABS 3.9  --   ?HGB 13.2 13.5  ?HCT 38.9 39.8  ?MCV 88.8 89.8  ?PLT 262 241  ? ?Cardiac Enzymes: ?No results for input(s): CKTOTAL, CKMB, CKMBINDEX, TROPONINI in the last 72 hours. ?BNP: ?Invalid input(s): POCBNP ?D-Dimer: ?No results for input(s): DDIMER in the last 72 hours. ?Hemoglobin A1C: ?No results for input(s): HGBA1C in the last 72  hours. ?Fasting Lipid Panel: ?No results for input(s): CHOL, HDL, LDLCALC, TRIG, CHOLHDL, LDLDIRECT in the last 72 hours. ?Thyroid Function Tests: ?No results for input(s): TSH, T4TOTAL, T3FREE, THYROIDAB in the last 72 hours. ? ?Invalid input(s): FREET3 ?Anemia Panel: ?No results for input(s): VITAMINB12, FOLATE, FERRITIN, TIBC, IRON, RETICCTPCT in the last 72 hours. ? ?DG Chest 1 View ? ?Result Date: 07/29/2021 ?CLINICAL DATA:  [place] on the left side EXAM: CHEST  1 VIEW COMPARISON:  07/23/2021 FINDINGS: Single frontal view of the chest demonstrates a stable cardiac silhouette. No airspace disease, effusion, or pneumothorax. No acute bony abnormality. IMPRESSION: 1. No acute intrathoracic process. Electronically Signed   By: Randa Ngo M.D.   On: 07/29/2021 16:30  ? ?DG Pelvis 1-2 Views ? ?Result Date: 07/29/2021 ?CLINICAL DATA:  Fell, pain EXAM: PELVIS - 1-2 VIEW COMPARISON:  None. FINDINGS: Single frontal view of the pelvis demonstrates a subcapital left femoral neck fracture with impaction and varus angulation at the fracture site. No dislocation. The right hip is unremarkable. The remainder of the bony pelvis is normal. IMPRESSION: 1. Impacted subcapital left femoral neck fracture. Electronically Signed   By: Randa Ngo M.D.   On: 07/29/2021 16:27  ? ?CT Head Wo Contrast ? ?Result Date: 07/29/2021 ?  CLINICAL DATA:  [place], head trauma EXAM: CT HEAD WITHOUT CONTRAST TECHNIQUE: Contiguous axial images were obtained from the base of the skull through the vertex without intravenous contrast. RADIATION DOSE REDUCTION: This exam was performed according to the departmental dose-optimization program which includes automated exposure control, adjustment of the mA and/or kV according to patient size and/or use of iterative reconstruction technique. COMPARISON:  06/10/2014 FINDINGS: Brain: No acute infarct or hemorrhage. Focal hypodensities in the left frontal periventricular white matter consistent with chronic small  vessel ischemic change. Lateral ventricles are unremarkable. 4 mm hyperdense region in the third ventricle reference image 20/2 compatible with colloid cyst. Previously this had measured up to 13 mm. No evidence of mass effect or hydrocephalus. No acute extra-axial fluid collections. No mass effect. Vascular: No hyperdense vessel or unexpected calcification. Skull: Normal. Negative for fracture or focal lesion. Sinuses/Orbits: There is diffuse mucosal thickening with partial opacification of the frontal, ethmoid, maxillary sinuses. Small gas fluid levels are seen within the maxillary sinuses bilaterally. Other: None. IMPRESSION: 1. No acute intracranial process. 2. 4 mm hyperdense colloid cyst, decreased in size since prior study where this measured up to 13 mm. 3. Diffuse paranasal sinus disease. Electronically Signed   By: Randa Ngo M.D.   On: 07/29/2021 16:12  ? ?CT Cervical Spine Wo Contrast ? ?Result Date: 07/29/2021 ?CLINICAL DATA:  [place], head trauma EXAM: CT CERVICAL SPINE WITHOUT CONTRAST TECHNIQUE: Multidetector CT imaging of the cervical spine was performed without intravenous contrast. Multiplanar CT image reconstructions were also generated. RADIATION DOSE REDUCTION: This exam was performed according to the departmental dose-optimization program which includes automated exposure control, adjustment of the mA and/or kV according to patient size and/or use of iterative reconstruction technique. COMPARISON:  None. FINDINGS: Alignment: There is straightening of the lower cervical spine likely due to multilevel spondylosis. Otherwise alignment is anatomic. Skull base and vertebrae: No acute fracture. No primary bone lesion or focal pathologic process. Soft tissues and spinal canal: No prevertebral fluid or swelling. No visible canal hematoma. Disc levels: Multilevel spondylosis and facet hypertrophy. There is bony fusion across the C2-3 facet joints. Prominent facet hypertrophic changes are seen at C3-4  and C4-5. There is partial bony fusion across the C6-7 disc space. Prominent spondylosis at C5-6. Upper chest: Airway is patent. Patchy dependent ground-glass airspace disease within the left upper lobe could be hypoventilatory. Other: Reconstructed images demonstrate no additional findings. IMPRESSION: 1. No acute cervical spine fracture. 2. Multilevel spondylosis and facet hypertrophy. 3. Dependent left upper lobe ground-glass consolidation, which could be related to hypoventilatory change or developing airspace disease. Electronically Signed   By: Randa Ngo M.D.   On: 07/29/2021 16:09  ? ?DG Shoulder Left ? ?Result Date: 07/29/2021 ?CLINICAL DATA:  Fell, pain EXAM: LEFT SHOULDER - 2+ VIEW COMPARISON:  None. FINDINGS: Frontal and transscapular views of the left shoulder are obtained. No fracture, subluxation, or dislocation. Mild glenohumeral and acromioclavicular joint osteoarthritis. Visualized portions of the left chest are clear. IMPRESSION: 1. Osteoarthritis.  No acute fracture. Electronically Signed   By: Randa Ngo M.D.   On: 07/29/2021 16:27  ? ?DG FEMUR MIN 2 VIEWS LEFT ? ?Result Date: 07/29/2021 ?CLINICAL DATA:  Fell, pain EXAM: LEFT FEMUR 2 VIEWS COMPARISON:  None. FINDINGS: Frontal and cross-table lateral views of the left femur are obtained. There is a subcapital left femoral neck fracture with impaction and varus angulation at the fracture site. No other acute bony abnormalities. Alignment of the left knee is anatomic. IMPRESSION: 1.  Subcapital left femoral neck fracture with impaction and varus angulation. Electronically Signed   By: Randa Ngo M.D.   On: 07/29/2021 16:28   ? ? ?Echo pending ? ?TELEMETRY: Atrial fibrillation with 75-80 nonspecific T wave changes: ? ?ASSESSMENT AND PLAN: ? ?Principal Problem: ?  Closed left hip fracture ([place]) ?Active Problems: ?  Atrial fibrillation, chronic ([place]) ?  Depression ?  COPD (chronic obstructive pulmonary disease) with emphysema ([place]) ?  History  of CVA (cerebrovascular accident) ?  Chronic hyponatremia ?  Fall ?  Acute renal failure superimposed on stage 3a chronic kidney disease ([place]) ?  Chronic diastolic CHF (congestive heart failure) ([place]) ?  ? ?Plan ?Atr

## 2021-07-30 NOTE — Anesthesia Preprocedure Evaluation (Addendum)
Anesthesia Evaluation  ?Patient identified by MRN, date of birth, ID band ?Patient awake ? ? ? ?Reviewed: ?Allergy & Precautions, NPO status , Patient's Chart, lab work & pertinent test results ? ?History of Anesthesia Complications ?Negative for: history of anesthetic complications ? ?Airway ?Mallampati: II ? ?TM Distance: >3 FB ?Neck ROM: Full ? ? ? Dental ? ?(+) Edentulous Upper,  ?  ?Pulmonary ?shortness of breath and with exertion, pneumonia (Concern for LUL infiltrate;  pt has productive cough and CT + LUL infoltarte; On antibiotics as well as acute Nasal cannual supplemental O2 at 2 lpm;  ), unresolved, COPD (Excerbation in midst of pulm infection),  oxygen dependent, former smoker,  ?  ?+ rhonchi ? ?+ wheezing ? ?rales ? ? ? Cardiovascular ?Exercise Tolerance: Poor ?METS: 3 - Mets +CHF (Mild- BNP elevated-  cleared by cardiology)  ?Normal cardiovascular exam+ dysrhythmias (Controlled- beta blocker last taken < 24 hr ago) Atrial Fibrillation  ?Rhythm:Regular Rate:Normal ? ? ?  ?Neuro/Psych ?PSYCHIATRIC DISORDERS Depression CVA, No Residual Symptoms   ? GI/Hepatic ?negative GI ROS, Neg liver ROS,   ?Endo/Other  ?negative endocrine ROSPt is on IV steroids for COPD exacerbation ? Renal/GU ?Renal InsufficiencyRenal disease  ?negative genitourinary ?  ?Musculoskeletal ? ?(+) Arthritis , Osteoarthritis,   ? Abdominal ?  ?Peds ? Hematology ?Conronic anticoagulation-  Reversed coumadin from INR 2 to 1.2 with Vit K;  1.2 is in normal range for this hospital;  Pt's coumadin is rversed   ?Anesthesia Other Findings ?4/22 TTE- ?1. Left ventricular ejection fraction, by estimation, is 60 to 65%. The  ?left ventricle has normal function. The left ventricle has no regional  ?wall motion abnormalities. Left ventricular diastolic function could not  ?be evaluated.  ??2. Right ventricular systolic function is normal. The right ventricular  ?size is normal.  ??3. The mitral valve was not well  visualized. Trivial mitral valve  ?regurgitation.  ??4. The aortic valve was not well visualized. Aortic valve regurgitation  ?is not visualized.  ? Reproductive/Obstetrics ?negative OB ROS ? ?  ? ? ? ? ? ? ? ? ? ? ? ? ? ?  ?  ? ? ? ? ? ? ?Anesthesia Physical ?Anesthesia Plan ? ?ASA: 4 and emergent ? ?Anesthesia Plan: Spinal  ? ?Post-op Pain Management: Dilaudid IV  ? ?Induction: Intravenous ? ?PONV Risk Score and Plan: 0 and Ondansetron ? ?Airway Management Planned: Simple Face Mask ? ?Additional Equipment:  ? ?Intra-op Plan:  ? ?Post-operative Plan: Possible Post-op intubation/ventilation ? ?Informed Consent: I have reviewed the patients History and Physical, chart, labs and discussed the procedure including the risks, benefits and alternatives for the proposed anesthesia with the patient or authorized representative who has indicated his/her understanding and acceptance.  ? ? ? ?Dental advisory given ? ?Plan Discussed with: CRNA ? ?Anesthesia Plan Comments: (Benefits and risk discussed with patient to include death, MI and CVA. Will proceed with SAB to avoid pulm risk related to pneumonia and mild CHF exacerbation as case is urgent. Counseled regarding postop ventilation if we have to convert to general anesthesia.  Pt wishes to proceed.)  ? ? ? ? ? ? ?Anesthesia Quick Evaluation ? ?

## 2021-07-31 ENCOUNTER — Encounter: Payer: Self-pay | Admitting: Specialist

## 2021-07-31 LAB — BASIC METABOLIC PANEL
Anion gap: 7 (ref 5–15)
BUN: 13 mg/dL (ref 8–23)
CO2: 25 mmol/L (ref 22–32)
Calcium: 8.6 mg/dL — ABNORMAL LOW (ref 8.9–10.3)
Chloride: 95 mmol/L — ABNORMAL LOW (ref 98–111)
Creatinine, Ser: 0.89 mg/dL (ref 0.44–1.00)
GFR, Estimated: >60 mL/min (ref >60)
Glucose, Bld: 124 mg/dL — ABNORMAL HIGH (ref 70–99)
Potassium: 4.2 mmol/L (ref 3.5–5.1)
Sodium: 127 mmol/L — ABNORMAL LOW (ref 135–145)

## 2021-07-31 LAB — RESP PANEL BY RT-PCR (FLU A&B, COVID) ARPGX2
Influenza A by PCR: NEGATIVE
Influenza B by PCR: NEGATIVE
SARS Coronavirus 2 by RT PCR: NEGATIVE

## 2021-07-31 LAB — CBC
HCT: 33 % — ABNORMAL LOW (ref 36.0–46.0)
Hemoglobin: 11.2 g/dL — ABNORMAL LOW (ref 12.0–15.0)
MCH: 30.8 pg (ref 26.0–34.0)
MCHC: 33.9 g/dL (ref 30.0–36.0)
MCV: 90.7 fL (ref 80.0–100.0)
Platelets: 203 10*3/uL (ref 150–400)
RBC: 3.64 MIL/uL — ABNORMAL LOW (ref 3.87–5.11)
RDW: 15.6 % — ABNORMAL HIGH (ref 11.5–15.5)
WBC: 7.6 10*3/uL (ref 4.0–10.5)
nRBC: 0 % (ref 0.0–0.2)

## 2021-07-31 LAB — PROTIME-INR
INR: 1.1 (ref 0.8–1.2)
Prothrombin Time: 14.6 seconds (ref 11.4–15.2)

## 2021-07-31 MED ORDER — WARFARIN SODIUM 5 MG PO TABS
5.0000 mg | ORAL_TABLET | Freq: Once | ORAL | Status: AC
  Administered 2021-07-31: 5 mg via ORAL
  Filled 2021-07-31: qty 1

## 2021-07-31 MED ORDER — TORSEMIDE 20 MG PO TABS
20.0000 mg | ORAL_TABLET | Freq: Every day | ORAL | Status: DC
  Administered 2021-07-31 – 2021-08-01 (×2): 20 mg via ORAL
  Filled 2021-07-31 (×3): qty 1

## 2021-07-31 MED ORDER — HYDROCODONE-ACETAMINOPHEN 5-325 MG PO TABS
1.0000 | ORAL_TABLET | ORAL | Status: DC | PRN
  Administered 2021-07-31 – 2021-08-01 (×2): 2 via ORAL
  Filled 2021-07-31 (×2): qty 2

## 2021-07-31 MED ORDER — ENSURE ENLIVE PO LIQD
237.0000 mL | Freq: Two times a day (BID) | ORAL | Status: DC
  Administered 2021-08-01: 237 mL via ORAL

## 2021-07-31 MED ORDER — MORPHINE SULFATE (PF) 2 MG/ML IV SOLN
1.0000 mg | INTRAVENOUS | Status: DC | PRN
  Administered 2021-07-31: 1 mg via INTRAVENOUS
  Filled 2021-07-31: qty 1

## 2021-07-31 MED ORDER — PNEUMOCOCCAL 20-VAL CONJ VACC 0.5 ML IM SUSY
0.5000 mL | PREFILLED_SYRINGE | INTRAMUSCULAR | Status: DC | PRN
  Filled 2021-07-31: qty 0.5

## 2021-07-31 NOTE — Progress Notes (Signed)
Subjective: ?1 Day Post-Op Procedure(s) (LRB): ?ARTHROPLASTY BIPOLAR HIP (HEMIARTHROPLASTY) (Left) ?  ?Patient is alert and oriented sitting up in bed.  She was up in a chair for 3 hours earlier she states.  Left hip pain is moderate. ?She is doing well otherwise. ?Patient reports pain as moderate. ? ?Objective:  ? ?VITALS:   ?Vitals:  ? 07/31/21 0731 07/31/21 1101  ?BP: 115/64 115/72  ?Pulse: 69 63  ?Resp: 15 17  ?Temp: 98.2 ?F (36.8 ?C) 98.1 ?F (36.7 ?C)  ?SpO2: 91% 96%  ? ? ?Neurologically intact ?Dorsiflexion/Plantar flexion intact ?Incision: dressing C/D/I ? ?LABS ?Recent Labs  ?  07/30/21 ?0444 07/30/21 ?1319 07/31/21 ?0449  ?HGB 13.5 12.6 11.2*  ?HCT 39.8 37.8 33.0*  ?WBC 7.6 10.6* 7.6  ?PLT 241 250 203  ? ? ?Recent Labs  ?  07/29/21 ?1527 07/30/21 ?0444 07/30/21 ?1319 07/31/21 ?0449  ?NA 129* 128*  --  127*  ?K 3.6 4.2  --  4.2  ?BUN 14 15  --  13  ?CREATININE 1.38* 1.06* 1.00 0.89  ?GLUCOSE 116* 165*  --  124*  ? ? ?Recent Labs  ?  07/30/21 ?0444 07/31/21 ?0449  ?INR 1.2 1.1  ? ? ? ?Assessment/Plan: ?1 Day Post-Op Procedure(s) (LRB): ?ARTHROPLASTY BIPOLAR HIP (HEMIARTHROPLASTY) (Left) ? ? ?Advance diet ?Up with therapy ?Discharge to SNF ? ?

## 2021-07-31 NOTE — TOC Progression Note (Signed)
Transition of Care (TOC) - Progression Note  ? ? ?Patient Details  ?Name: Jessica Long ?MRN: [id number] ?Date of Birth: [birth year] ? ?Transition of Care (TOC) CM/SW Contact  ?Conception Oms, RN ?Phone Number: ?07/31/2021, 3:50 PM ? ?Clinical Narrative:    ?Met the patient and called her niece June, reviewed the bed offers, we also reviewed the medicare star rating, she will call me back with a choice ? ? ?Expected Discharge Plan: [place] ?Barriers to Discharge: Continued Medical Work up ? ?Expected Discharge Plan and Services ?Expected Discharge Plan: [place] ?  ?  ?  ?  ?                ?  ?  ?  ?  ?  ?  ?  ?  ?  ?  ? ? ?Social Determinants of Health (SDOH) Interventions ?  ? ?Readmission Risk Interventions ? ?  07/31/2021  ?  2:43 PM  ?Readmission Risk Prevention Plan  ?Transportation Screening Complete  ?PCP or Specialist Appt within 3-5 Days Complete  ?[place] or Home Care Consult Complete  ?Palliative Care Screening Not Applicable  ?Medication Review Press photographer) Referral to Pharmacy  ? ? ?

## 2021-07-31 NOTE — Evaluation (Signed)
Physical Therapy Evaluation ?Patient Details ?Name: Jessica Long ?MRN: [id number] ?DOB: 1945/06/17 ?Today's Date: 07/31/2021 ? ?History of Present Illness ? Pt is a 76 y.o. female who presents to the ED via EMS w/ complaints of left hip and shoulder pain due to fall on 3/25. Currently admitted due to closed L hip fx and hemiarthroplasty. PmHx: A. Fib, Breast Cancer, CHF, CKD, Depression, HLD, OA, CVA. ?  ?Clinical Impression ? Pt is awake resting in bed upon PT entrance into room for evaluation today. Pt is A&Ox4 and reports current pain is 5-6/10 at rest. Prior to hospitalization Pt states she lives at [facility] and is independent w/ ADLs, occasionally requires assistance PRN, but states she often doesn't rely on it. She uses a walker at baseline and her home set-up is handicap accessible.  ? ?Pt is able to perform supine to sit bed mobility w/ modA; required for LE transfer, trunk elevation, and utilization of chuck pad to anterior scoot hips to EOB. Once seated EOB she is able to perform sit to stand w/ modA and height of bed elevated using RW; progresses to minA for step-pivot transfer to recliner w/ RW. Further mobility/ambulation deferred to increased c/o pain and Pt request to "just sit in the chair for a little now". Pt will benefit from continued skilled PT in order to increase LE strength, improve mobility/gait, decrease c/o pain, and restore PLOF. Current discharge recommendation to SNF is appropriate due to the level of assistance required by the patient to ensure safety and improve overall function. ?   ?   ? ?Recommendations for follow up therapy are one component of a multi-disciplinary discharge planning process, led by the attending physician.  Recommendations may be updated based on patient status, additional functional criteria and insurance authorization. ? ?Follow Up Recommendations Skilled nursing-short term rehab (<3 hours/day) ? ?  ?Assistance Recommended at Discharge  Frequent or constant Supervision/Assistance  ?Patient can return home with the following ? A lot of help with walking and/or transfers;A lot of help with bathing/dressing/bathroom;Assistance with cooking/housework;Assist for transportation;Help with stairs or ramp for entrance ? ?  ?Equipment Recommendations None recommended by PT  ?Recommendations for Other Services ?    ?  ?Functional Status Assessment Patient has had a recent decline in their functional status and demonstrates the ability to make significant improvements in function in a reasonable and predictable amount of time.  ? ?  ?Precautions / Restrictions Precautions ?Precautions: Posterior Hip;Fall ?Precaution Booklet Issued: Yes (comment) ?Restrictions ?Weight Bearing Restrictions: Yes ?LLE Weight Bearing: Partial weight bearing ?LLE Partial Weight Bearing Percentage or Pounds: 50% per chart  ? ?  ? ?Mobility ? Bed Mobility ?Overal bed mobility: Needs Assistance ?Bed Mobility: Supine to Sit ?  ?  ?Supine to sit: Mod assist ?  ?  ?General bed mobility comments: for LE transfer and trunk elevation; utilized chuck pad for hip negotiation ?  ? ?Transfers ?Overall transfer level: Needs assistance ?Equipment used: Rolling walker (2 wheels) ?Transfers: Sit to/from Stand, Bed to chair/wheelchair/BSC ?Sit to Stand: Mod assist ?  ?Step pivot transfers: Min assist ?  ?  ?  ?General transfer comment: modA from elevated height for sit to stand, progressed to minA for step pivot transfer to recliner. ?  ? ?Ambulation/Gait ?  ?  ?  ?  ?  ?  ?  ?General Gait Details: deferred ? ?Stairs ?  ?  ?  ?  ?  ? ?Wheelchair Mobility ?  ? ?Modified Rankin (  Stroke Patients Only) ?  ? ?  ? ?Balance Overall balance assessment: Needs assistance ?Sitting-balance support: Bilateral upper extremity supported, Feet supported ?Sitting balance-Leahy Scale: Fair ?  ?  ?Standing balance support: Bilateral upper extremity supported, During functional activity, Reliant on assistive device for  balance ?Standing balance-Leahy Scale: Fair ?  ?  ?  ?  ?  ?  ?  ?  ?  ?  ?  ?  ?   ? ? ? ?Pertinent Vitals/Pain Pain Assessment ?Pain Assessment: Faces ?Faces Pain Scale: Hurts whole lot ?Pain Descriptors / Indicators: Guarding, Discomfort, Moaning, Restless, Grimacing ?Pain Intervention(s): Limited activity within patient's tolerance, Monitored during session, Repositioned  ? ? ?Home Living Family/patient expects to be discharged to:: Assisted living ?  ?  ?  ?  ?  ?  ?  ?  ?Home Equipment: Conservation officer, nature (2 wheels);Shower seat ?   ?  ?Prior Function Prior Level of Function : Independent/Modified Independent ?  ?  ?  ?  ?  ?  ?  ?  ?  ? ? ?Hand Dominance  ?   ? ?  ?Extremity/Trunk Assessment  ? Upper Extremity Assessment ?Upper Extremity Assessment: Overall WFL for tasks assessed ?  ? ?Lower Extremity Assessment ?Lower Extremity Assessment: Overall WFL for tasks assessed;Generalized weakness ?  ? ?   ?Communication  ?    ?Cognition Arousal/Alertness: Awake/alert ?Behavior During Therapy: [facility] for tasks assessed/performed ?Overall Cognitive Status: Within Functional Limits for tasks assessed ?  ?  ?  ?  ?  ?  ?  ?  ?  ?  ?  ?  ?  ?  ?  ?  ?  ?  ?  ? ?  ?General Comments   ? ?  ?Exercises    ? ?Assessment/Plan  ?  ?PT Assessment Patient needs continued PT services  ?PT Problem List Decreased strength;Decreased mobility;Decreased range of motion;Decreased coordination;Decreased knowledge of precautions;Decreased activity tolerance;Decreased balance;Decreased cognition;Pain;Decreased safety awareness ? ?   ?  ?PT Treatment Interventions DME instruction;Therapeutic exercise;Gait training;Balance training;Stair training;Neuromuscular re-education;Functional mobility training;Therapeutic activities;Patient/family education   ? ?PT Goals (Current goals can be found in the Care Plan section)  ?Acute Rehab PT Goals ?Patient Stated Goal: to go home ?PT Goal Formulation: With patient ?Time For Goal Achievement:  08/14/21 ?Potential to Achieve Goals: Fair ? ?  ?Frequency 7X/week ?  ? ? ?Co-evaluation   ?  ?  ?  ?  ? ? ?  ?AM-PAC PT "6 Clicks" Mobility  ?Outcome Measure Help needed turning from your back to your side while in a flat bed without using bedrails?: A Little ?Help needed moving from lying on your back to sitting on the side of a flat bed without using bedrails?: A Little ?Help needed moving to and from a bed to a chair (including a wheelchair)?: A Lot ?Help needed standing up from a chair using your arms (e.g., wheelchair or bedside chair)?: A Lot ?Help needed to walk in hospital room?: A Lot ?Help needed climbing 3-5 steps with a railing? : A Lot ?6 Click Score: 14 ? ?  ?End of Session Equipment Utilized During Treatment: Gait belt ?Activity Tolerance: Patient limited by pain ?Patient left: in chair;with chair alarm set;with call bell/phone within reach;with family/visitor present ?Nurse Communication: Mobility status ?PT Visit Diagnosis: Unsteadiness on feet (R26.81);History of falling (Z91.81);Pain;Muscle weakness (generalized) (M62.81) ?Pain - Right/Left: Left ?Pain - part of body: Hip ?  ? ?Time: 5643-3295 ?PT Time Calculation (min) (ACUTE ONLY):  33 min ? ? ?Charges:     ?  ?  ?   ? ?Jonnie Kind, SPT ?07/31/2021, 12:00 PM ? ?

## 2021-07-31 NOTE — Progress Notes (Signed)
The above named patient is recommended to go to Short Term Rehab for strengthening and gait training for balance.  It is expected that the Short Term Rehab stay will be less than 30 days.  The patient is expected to return home after Rehab.  ?

## 2021-07-31 NOTE — TOC Progression Note (Signed)
Transition of Care (TOC) - Progression Note  ? ? ?Patient Details  ?Name: Jessica Long ?MRN: [id number] ?Date of Birth: 02/13/1946 ? ?Transition of Care (TOC) CM/SW Contact  ?Conception Oms, RN ?Phone Number: ?07/31/2021, 4:50 PM ? ?Clinical Narrative:    ?Ins approved to go to [employer] ? ? ?Expected Discharge Plan: [place] ?Barriers to Discharge: Continued Medical Work up ? ?Expected Discharge Plan and Services ?Expected Discharge Plan: [place] ?  ?  ?  ?  ?                ?  ?  ?  ?  ?  ?  ?  ?  ?  ?  ? ? ?Social Determinants of Health (SDOH) Interventions ?  ? ?Readmission Risk Interventions ? ?  07/31/2021  ?  2:43 PM  ?Readmission Risk Prevention Plan  ?Transportation Screening Complete  ?PCP or Specialist Appt within 3-5 Days Complete  ?[place] or Home Care Consult Complete  ?Palliative Care Screening Not Applicable  ?Medication Review Press photographer) Referral to Pharmacy  ? ? ?

## 2021-07-31 NOTE — TOC Progression Note (Signed)
Transition of Care (TOC) - Progression Note  ? ? ?Patient Details  ?Name: Jessica Long ?MRN: [id number] ?Date of Birth: 12-Apr-1946 ? ?Transition of Care (TOC) CM/SW Contact  ?Conception Oms, RN ?Phone Number: ?07/31/2021, 1:10 PM ? ?Clinical Narrative:   Uploaded the information to [place] [facility] to Obtain PASSR ? ? ? ?  ?  ? ?Expected Discharge Plan and Services ?  ?  ?  ?  ?  ?                ?  ?  ?  ?  ?  ?  ?  ?  ?  ?  ? ? ?Social Determinants of Health (SDOH) Interventions ?  ? ?Readmission Risk Interventions ?   ? View : No data to display.  ?  ?  ?  ? ? ?

## 2021-07-31 NOTE — Evaluation (Signed)
Occupational Therapy Evaluation Patient Details Name: Jessica Long MRN: [id number] DOB: 1945/12/27 Today's Date: 07/31/2021   History of Present Illness Pt is a 76 y.o. female who presents to the ED via EMS w/ complaints of left hip and shoulder pain due to fall on 3/25. Currently admitted due to closed L hip fx and hemiarthroplasty. PmHx: A. Fib, Breast Cancer, CHF, CKD, Depression, HLD, OA, CVA.   Clinical Impression   Pt was seen for OT evaluation this date. Prior to hospital admission, pt was using a RW for mobility and receiving assist from ALF for shower transfers, meals, meds, cleaning, and transportation. She denies other falls aside from this one. Pt received in the recliner and eager to get out of the chair and back to bed. Pt oriented x4, hyper verbal requiring VC to redirect to task at hand several times throughout session. Pt unable to verbalize any posterior THPs or PWBing status at start of session. Currently pt demonstrates impairments as described below (See OT problem list) which functionally limit her ability to perform ADL/self-care tasks. Pt currently requires MOD A +2 for bed mobility, MIN A +2 for safety/CGA, MAX A for seated LB ADL, and MIN A for seated UB ADL tasks. Pt endorsing significant pain in L hip. Pt instructed in posterior THPs, PWBing and how to maintain precautions during ADL/mobility, RW mgt, falls prevention, and ADL transfer training. Pt verbalized understanding. Pt would benefit from skilled OT services to address noted impairments and functional limitations (see below for any additional details) in order to maximize safety and independence while minimizing falls risk and caregiver burden. Upon hospital discharge, recommend STR to maximize pt safety and return to PLOF.     Recommendations for follow up therapy are one component of a multi-disciplinary discharge planning process, led by the attending physician.  Recommendations may be updated based on patient  status, additional functional criteria and insurance authorization.   Follow Up Recommendations  Skilled nursing-short term rehab (<3 hours/day)    Assistance Recommended at Discharge Frequent or constant Supervision/Assistance  Patient can return home with the following A lot of help with walking and/or transfers;A lot of help with bathing/dressing/bathroom;Assist for transportation;Assistance with cooking/housework;Direct supervision/assist for medications management;Help with stairs or ramp for entrance    Functional Status Assessment  Patient has had a recent decline in their functional status and demonstrates the ability to make significant improvements in function in a reasonable and predictable amount of time.  Equipment Recommendations  BSC/3in1    Recommendations for Other Services       Precautions / Restrictions Precautions Precautions: Posterior Hip;Fall Precaution Booklet Issued: Yes (comment) Precaution Comments: pt recalled 0/3 posterior precautions at start of session, able to recall 2/3 at end of session Restrictions Weight Bearing Restrictions: Yes LLE Weight Bearing: Partial weight bearing LLE Partial Weight Bearing Percentage or Pounds: 50% per chart      Mobility Bed Mobility Overal bed mobility: Needs Assistance Bed Mobility: Sit to Supine       Sit to supine: Mod assist, +2 for physical assistance        Transfers Overall transfer level: Needs assistance Equipment used: Rolling walker (2 wheels) Transfers: Sit to/from Stand, Bed to chair/wheelchair/BSC Sit to Stand: Mod assist, +2 safety/equipment     Step pivot transfers: Min assist, +2 safety/equipment     General transfer comment: VC for sequencing and moving RW, VC for PWBing; +2 for CGA      Balance Overall balance assessment: Needs assistance Sitting-balance support:  Bilateral upper extremity supported, Feet supported, Single extremity supported Sitting balance-Leahy Scale: Fair      Standing balance support: Bilateral upper extremity supported, During functional activity, Reliant on assistive device for balance Standing balance-Leahy Scale: Fair Standing balance comment: heavily reliant no overt LOB                           ADL either performed or assessed with clinical judgement   ADL Overall ADL's : Needs assistance/impaired                                       General ADL Comments: Pt currently requires MAX A For LB ADL tasks, MIN A for seated UB ADL tasks, MIN-MOD A +2 for safety for ADL transfers, and VC for maintaining precautions.     Vision         Perception     Praxis      Pertinent Vitals/Pain Pain Assessment Pain Assessment: Faces Faces Pain Scale: Hurts whole lot Pain Location: L hip Pain Descriptors / Indicators: Guarding, Discomfort, Moaning, Restless, Grimacing Pain Intervention(s): Limited activity within patient's tolerance, Monitored during session, Premedicated before session, Repositioned     Hand Dominance Right   Extremity/Trunk Assessment Upper Extremity Assessment Upper Extremity Assessment: Generalized weakness   Lower Extremity Assessment Lower Extremity Assessment: Generalized weakness;LLE deficits/detail LLE: Unable to fully assess due to pain       Communication Communication Communication: No difficulties   Cognition Arousal/Alertness: Awake/alert Behavior During Therapy: WFL for tasks assessed/performed Overall Cognitive Status: Within Functional Limits for tasks assessed                                 General Comments: hyperverbal requiring cues to redirect to task     General Comments       Exercises Other Exercises Other Exercises: Pt instructed in falls prevention, RW mgt, ADL transfers, precautions and how to maintain during ADL/mobility   Shoulder Instructions      Home Living Family/patient expects to be discharged to:: Assisted living                              Home Equipment: Agricultural consultant (2 wheels);Shower seat          Prior Functioning/Environment Prior Level of Function : Independent/Modified Independent;Needs assist       Physical Assist : ADLs (physical)   ADLs (physical): Bathing Mobility Comments: Pt reports using RW at baseline ADLs Comments: Pt reports assist for shower transfers otherwise completes bathing herself, indep with dressing, has assist for meals, cleaning, meds, and transportation. Makes her own bed. Endorses only this fall.        OT Problem List: Decreased strength;Pain;Decreased range of motion;Impaired balance (sitting and/or standing);Decreased knowledge of use of DME or AE;Decreased knowledge of precautions      OT Treatment/Interventions: Self-care/ADL training;Therapeutic exercise;Therapeutic activities;Patient/family education;Balance training    OT Goals(Current goals can be found in the care plan section) Acute Rehab OT Goals Patient Stated Goal: have less pain and get better OT Goal Formulation: With patient Time For Goal Achievement: 08/14/21 Potential to Achieve Goals: Good ADL Goals Pt Will Perform Lower Body Dressing: with adaptive equipment;sit to/from stand;with min guard assist (maintaining precautions) Pt Will Transfer  to Toilet: with min guard assist;ambulating;bedside commode (LRAD, maintaining precautions) Pt Will Perform Toileting - Clothing Manipulation and hygiene: sitting/lateral leans;with supervision;with set-up Additional ADL Goal #1: Pt will verbalize plan to implement at least 2 learned falls prevention strategies. Additional ADL Goal #2: Pt will complete bed mobility with supervision and PRN VC for precautions  OT Frequency: Min 2X/week    Co-evaluation              AM-PAC OT "6 Clicks" Daily Activity     Outcome Measure Help from another person eating meals?: None Help from another person taking care of personal grooming?: A Little Help  from another person toileting, which includes using toliet, bedpan, or urinal?: A Lot Help from another person bathing (including washing, rinsing, drying)?: A Lot Help from another person to put on and taking off regular upper body clothing?: A Little Help from another person to put on and taking off regular lower body clothing?: A Lot 6 Click Score: 16   End of Session Equipment Utilized During Treatment: Gait belt;Rolling walker (2 wheels);Oxygen Nurse Communication: Mobility status (2L)  Activity Tolerance: Patient limited by pain Patient left: in bed;with call bell/phone within reach;with bed alarm set  OT Visit Diagnosis: Other abnormalities of gait and mobility (R26.89);Muscle weakness (generalized) (M62.81);History of falling (Z91.81);Pain Pain - Right/Left: Left Pain - part of body: Hip                Time: 4132-4401 OT Time Calculation (min): 29 min Charges:  OT General Charges $OT Visit: 1 Visit OT Evaluation $OT Eval Moderate Complexity: 1 Mod OT Treatments $Self Care/Home Management : 8-22 mins  Arman Filter., MPH, MS, OTR/L ascom [phone] 07/31/21, 3:17 PM

## 2021-07-31 NOTE — [place] FL2 (Signed)
?[place] MEDICAID FL2 LEVEL OF CARE SCREENING TOOL  ?  ? ?IDENTIFICATION  ?Patient Name: ?Jessica Long Birthdate: 01-19-1946 Sex: female Admission Date (Current Location): ?07/29/2021  ?[place] and [place] Number: ? [place] ?  Facility and Address:  ?[facility], [address], [place], [place] [address] ?     Provider Number: ?[id number]  ?Attending Physician Name and Address:  ?Fritzi Mandes, MD ? Relative Name and Phone Number:  ?  ?   ?Current Level of Care: ?Hospital Recommended Level of Care: ?[place] Prior Approval Number: ?  ? ?Date Approved/Denied: ?  PASRR Number: ?Pending ? ?Discharge Plan: ?SNF ?  ? ?Current Diagnoses: ?Patient Active Problem List  ? Diagnosis Date Noted  ? Acute pain of left shoulder   ? Closed left hip fracture ([place]) 07/29/2021  ? Fall 07/29/2021  ? Acute renal failure superimposed on stage 3a chronic kidney disease ([place]) 07/29/2021  ? Chronic diastolic CHF (congestive heart failure) ([place]) 07/29/2021  ? Intractable vomiting 07/23/2021  ? Stage 3a chronic kidney disease ([place]) 07/22/2021  ? Current use of long term anticoagulation   ? History of CVA (cerebrovascular accident)   ? History of lithotripsy   ? Chronic hyponatremia   ? Nausea vomiting and diarrhea   ? 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 Hydronephrosis 09/03/2020  ? AKI (acute kidney injury) ([place]) 08/31/2020  ? COPD (chronic obstructive pulmonary disease) with emphysema ([place]) 08/31/2020  ? Shortness of breath 08/31/2020  ? Atrial fibrillation, chronic ([place])   ? Depression   ? ? ?Orientation RESPIRATION BLADDER Height & Weight   ?  ?Self, Time, Place, Situation ? Normal Continent, External catheter Weight: 77.1 kg ?Height:  '5\' 8"'$  (172.7 cm)  ?BEHAVIORAL SYMPTOMS/MOOD NEUROLOGICAL BOWEL NUTRITION STATUS  ?    Continent Diet (See DC SUmmary)  ?AMBULATORY STATUS COMMUNICATION OF NEEDS Skin   ?Extensive Assist Verbally Bruising ?  ?  ?  ?    ?     ?     ? ? ?Personal Care Assistance  Level of Assistance  ?Bathing, Feeding, Dressing Bathing Assistance: Limited assistance ?Feeding assistance: Independent ?Dressing Assistance: Maximum assistance ?   ? ?Functional Limitations Info  ?    ?  ?   ? ? ?SPECIAL CARE FACTORS FREQUENCY  ?PT (By licensed PT), OT (By licensed OT)   ?  ?PT Frequency: 5 times per week ?OT Frequency: 5 times per week ?  ?  ?  ?   ? ? ?Contractures Contractures Info: Not present  ? ? ?Additional Factors Info  ?Code Status, Allergies Code Status Info: full code ?Allergies Info: Amoxicillin, AMpicillin ?  ?  ?  ?   ? ?Current Medications (07/31/2021):  This is the current hospital active medication list ?Current Facility-Administered Medications  ?Medication Dose Route Frequency Provider Last Rate Last Admin  ? acetaminophen (TYLENOL) tablet 325-650 mg  325-650 mg Oral Q6H PRN Earnestine Leys, MD      ? albuterol (PROVENTIL) (2.5 MG/3ML) 0.083% nebulizer solution 2.5 mg  2.5 mg Inhalation Q4H PRN Earnestine Leys, MD      ? ALPRAZolam Duanne Moron) tablet 0.25 mg  0.25 mg Oral BID PRN Fritzi Mandes, MD   0.25 mg at 07/30/21 2154  ? alum & mag hydroxide-simeth (MAALOX/MYLANTA) 200-200-20 MG/5ML suspension 30 mL  30 mL Oral Q4H PRN Earnestine Leys, MD      ? bisacodyl (DULCOLAX) suppository 10 mg  10 mg Rectal Daily PRN Earnestine Leys, MD   10 mg at  07/30/21 1559  ? buPROPion (WELLBUTRIN XL) 24 hr tablet 300 mg  300 mg Oral Daily Earnestine Leys, MD   300 mg at 07/31/21 0930  ? cholecalciferol (VITAMIN D3) tablet 5,000 Units  5,000 Units Oral Daily Earnestine Leys, MD   5,000 Units at 07/30/21 1436  ? dextromethorphan-guaiFENesin (MUCINEX DM) 30-600 MG per 12 hr tablet 1 tablet  1 tablet Oral BID PRN Earnestine Leys, MD   1 tablet at 07/30/21 1433  ? docusate sodium (COLACE) capsule 100 mg  100 mg Oral BID Earnestine Leys, MD   100 mg at 07/31/21 7824  ? doxycycline (VIBRA-TABS) tablet 100 mg  100 mg Oral Q12H Earnestine Leys, MD   100 mg at 07/31/21 0914  ? famotidine (PEPCID) tablet 10 mg  10 mg  Oral Tora Duck, MD   10 mg at 07/30/21 2156  ? ferrous sulfate tablet 325 mg  325 mg Oral Q breakfast Earnestine Leys, MD   325 mg at 07/31/21 0914  ? fluticasone (FLONASE) 50 MCG/ACT nasal spray 1 spray  1 spray Each Nare Daily PRN Earnestine Leys, MD      ? HYDROcodone-acetaminophen (NORCO/VICODIN) 5-325 MG per tablet 1-2 tablet  1-2 tablet Oral Q4H PRN Fritzi Mandes, MD      ? ipratropium-albuterol (DUONEB) 0.5-2.5 (3) MG/3ML nebulizer solution 3 mL  3 mL Nebulization Q6H PRN Fritzi Mandes, MD      ? loratadine (CLARITIN) tablet 10 mg  10 mg Oral Daily Earnestine Leys, MD   10 mg at 07/31/21 2353  ? menthol-cetylpyridinium (CEPACOL) lozenge 3 mg  1 lozenge Oral PRN Earnestine Leys, MD      ? Or  ? phenol (CHLORASEPTIC) mouth spray 1 spray  1 spray Mouth/Throat PRN Earnestine Leys, MD      ? methocarbamol (ROBAXIN) tablet 500 mg  500 mg Oral Q6H PRN Earnestine Leys, MD   500 mg at 07/30/21 1817  ? Or  ? methocarbamol (ROBAXIN) 500 mg in dextrose 5 % 50 mL IVPB  500 mg Intravenous Q6H PRN Earnestine Leys, MD      ? metoprolol tartrate (LOPRESSOR) tablet 12.5 mg  12.5 mg Oral BID Earnestine Leys, MD   12.5 mg at 07/31/21 6144  ? midodrine (PROAMATINE) tablet 5 mg  5 mg Oral TID Earnestine Leys, MD   5 mg at 07/31/21 3154  ? morphine (PF) 2 MG/ML injection 0.5-1 mg  0.5-1 mg Intravenous Q2H PRN Earnestine Leys, MD   1 mg at 07/30/21 1950  ? multivitamin with minerals tablet 1 tablet  1 tablet Oral Daily Earnestine Leys, MD   1 tablet at 07/30/21 1437  ? ondansetron (ZOFRAN) tablet 4 mg  4 mg Oral Q6H PRN Earnestine Leys, MD      ? Or  ? ondansetron [facility]) injection 4 mg  4 mg Intravenous Q6H PRN Earnestine Leys, MD      ? pantoprazole (PROTONIX) EC tablet 40 mg  40 mg Oral Daily Earnestine Leys, MD   40 mg at 07/31/21 0086  ? senna-docusate (Senokot-S) tablet 1 tablet  1 tablet Oral QHS PRN Earnestine Leys, MD   1 tablet at 07/30/21 2156  ? simvastatin (ZOCOR) tablet 20 mg  20 mg Oral Tora Duck, MD   20 mg at  07/30/21 2156  ? sodium phosphate (FLEET) 7-19 GM/118ML enema 1 enema  1 enema Rectal Once PRN Earnestine Leys, MD      ? tamsulosin (FLOMAX) capsule 0.4 mg  0.4 mg Oral Daily Earnestine Leys, MD  0.4 mg at 07/31/21 8412  ? vitamin B-12 (CYANOCOBALAMIN) tablet 250 mcg  250 mcg Oral Daily Earnestine Leys, MD   250 mcg at 07/31/21 8208  ? warfarin (COUMADIN) tablet 5 mg  5 mg Oral ONCE-1600 Dorothe Pea, RPH      ? Warfarin - Pharmacist Dosing Inpatient   Does not apply q1600 Dorothe Pea, [facility]   Given at 07/30/21 1553  ? zolpidem (AMBIEN) tablet 2.5 mg  2.5 mg Oral QHS PRN Earnestine Leys, MD      ? ? ? ?Discharge Medications: ?Please see discharge summary for a list of discharge medications. ? ?Relevant Imaging Results: ? ?Relevant Lab Results: ? ? ?Additional Information ?SS #: [id number] ? ?Conception Oms, RN ? ? ? ? ?

## 2021-07-31 NOTE — Progress Notes (Signed)
[facility] Card[facility] ? ? ? ?SUBJECTIVE: Patient reasonably stable postop still complains of mild pain getting physical therapy denies palpitation tachycardia no chest pain feels reasonably well ? ? ?Vitals:  ? 07/30/21 2130 07/31/21 0341 07/31/21 0342 07/31/21 0731  ?BP: (!) 108/56 107/62  115/64  ?Pulse: 76 73 73 69  ?Resp:  16  15  ?Temp:  98 ?F (36.7 ?C)  98.2 ?F (36.8 ?C)  ?TempSrc:      ?SpO2:  (!) 85% 92% 91%  ?Weight:      ?Height:      ? ? ? ?Intake/Output Summary (Last 24 hours) at 07/31/2021 0819 ?Last data filed at 07/31/2021 0342 ?Gross per 24 hour  ?Intake 1850.04 ml  ?Output 850 ml  ?Net 1000.04 ml  ? ? ? ? ?PHYSICAL EXAM ? ?General: Well developed, well nourished, in no acute distress ?HEENT:  Normocephalic and atramatic ?Neck:  No JVD.  ?Lungs: Clear bilaterally to auscultation and percussion. ?Heart: HRRR . Normal S1 and S2 without gallops or murmurs.  ?Abdomen: Bowel sounds are positive, abdomen soft and non-tender  ?Msk:  Back normal, normal gait. Normal strength and tone for age. ?Extremities: No clubbing, cyanosis or edema.   ?Neuro: Alert and oriented X 3. ?Psych:  Good affect, responds appropriately ? ? ?LABS: ?Basic Metabolic Panel: ?Recent Labs  ?  07/30/21 ?0444 07/30/21 ?1319 07/31/21 ?0449  ?NA 128*  --  127*  ?K 4.2  --  4.2  ?CL 95*  --  95*  ?CO2 25  --  25  ?GLUCOSE 165*  --  124*  ?BUN 15  --  13  ?CREATININE 1.06* 1.00 0.89  ?CALCIUM 9.1  --  8.6*  ? ?Liver Function Tests: ?Recent Labs  ?  07/29/21 ?1527  ?AST 26  ?ALT 27  ?ALKPHOS 49  ?BILITOT 0.9  ?PROT 6.4*  ?ALBUMIN 3.6  ? ?No results for input(s): LIPASE, AMYLASE in the last 72 hours. ?CBC: ?Recent Labs  ?  07/29/21 ?1527 07/30/21 ?0444 07/30/21 ?1319 07/31/21 ?0449  ?WBC 7.8   < > 10.6* 7.6  ?NEUTROABS 3.9  --   --   --   ?HGB 13.2   < > 12.6 11.2*  ?HCT 38.9   < > 37.8 33.0*  ?MCV 88.8   < > 89.8 90.7  ?PLT 262   < > 250 203  ? < > = values in this interval not displayed.  ? ?Cardiac Enzymes: ?No results for input(s): CKTOTAL, CKMB,  CKMBINDEX, TROPONINI in the last 72 hours. ?BNP: ?Invalid input(s): POCBNP ?D-Dimer: ?No results for input(s): DDIMER in the last 72 hours. ?Hemoglobin A1C: ?No results for input(s): HGBA1C in the last 72 hours. ?Fasting Lipid Panel: ?No results for input(s): CHOL, HDL, LDLCALC, TRIG, CHOLHDL, LDLDIRECT in the last 72 hours. ?Thyroid Function Tests: ?No results for input(s): TSH, T4TOTAL, T3FREE, THYROIDAB in the last 72 hours. ? ?Invalid input(s): FREET3 ?Anemia Panel: ?No results for input(s): VITAMINB12, FOLATE, FERRITIN, TIBC, IRON, RETICCTPCT in the last 72 hours. ? ?DG Chest 1 View ? ?Result Date: 07/29/2021 ?CLINICAL DATA:  [place] on the left side EXAM: CHEST  1 VIEW COMPARISON:  07/23/2021 FINDINGS: Single frontal view of the chest demonstrates a stable cardiac silhouette. No airspace disease, effusion, or pneumothorax. No acute bony abnormality. IMPRESSION: 1. No acute intrathoracic process. Electronically Signed   By: Randa Ngo M.D.   On: 07/29/2021 16:30  ? ?DG Pelvis 1-2 Views ? ?Result Date: 07/29/2021 ?CLINICAL DATA:  Fell, pain EXAM: PELVIS - 1-2  VIEW COMPARISON:  None. FINDINGS: Single frontal view of the pelvis demonstrates a subcapital left femoral neck fracture with impaction and varus angulation at the fracture site. No dislocation. The right hip is unremarkable. The remainder of the bony pelvis is normal. IMPRESSION: 1. Impacted subcapital left femoral neck fracture. Electronically Signed   By: Randa Ngo M.D.   On: 07/29/2021 16:27  ? ?CT Head Wo Contrast ? ?Result Date: 07/29/2021 ?CLINICAL DATA:  [place], head trauma EXAM: CT HEAD WITHOUT CONTRAST TECHNIQUE: Contiguous axial images were obtained from the base of the skull through the vertex without intravenous contrast. RADIATION DOSE REDUCTION: This exam was performed according to the departmental dose-optimization program which includes automated exposure control, adjustment of the mA and/or kV according to patient size and/or use of  iterative reconstruction technique. COMPARISON:  06/10/2014 FINDINGS: Brain: No acute infarct or hemorrhage. Focal hypodensities in the left frontal periventricular white matter consistent with chronic small vessel ischemic change. Lateral ventricles are unremarkable. 4 mm hyperdense region in the third ventricle reference image 20/2 compatible with colloid cyst. Previously this had measured up to 13 mm. No evidence of mass effect or hydrocephalus. No acute extra-axial fluid collections. No mass effect. Vascular: No hyperdense vessel or unexpected calcification. Skull: Normal. Negative for fracture or focal lesion. Sinuses/Orbits: There is diffuse mucosal thickening with partial opacification of the frontal, ethmoid, maxillary sinuses. Small gas fluid levels are seen within the maxillary sinuses bilaterally. Other: None. IMPRESSION: 1. No acute intracranial process. 2. 4 mm hyperdense colloid cyst, decreased in size since prior study where this measured up to 13 mm. 3. Diffuse paranasal sinus disease. Electronically Signed   By: Randa Ngo M.D.   On: 07/29/2021 16:12  ? ?CT Cervical Spine Wo Contrast ? ?Result Date: 07/29/2021 ?CLINICAL DATA:  [place], head trauma EXAM: CT CERVICAL SPINE WITHOUT CONTRAST TECHNIQUE: Multidetector CT imaging of the cervical spine was performed without intravenous contrast. Multiplanar CT image reconstructions were also generated. RADIATION DOSE REDUCTION: This exam was performed according to the departmental dose-optimization program which includes automated exposure control, adjustment of the mA and/or kV according to patient size and/or use of iterative reconstruction technique. COMPARISON:  None. FINDINGS: Alignment: There is straightening of the lower cervical spine likely due to multilevel spondylosis. Otherwise alignment is anatomic. Skull base and vertebrae: No acute fracture. No primary bone lesion or focal pathologic process. Soft tissues and spinal canal: No prevertebral  fluid or swelling. No visible canal hematoma. Disc levels: Multilevel spondylosis and facet hypertrophy. There is bony fusion across the C2-3 facet joints. Prominent facet hypertrophic changes are seen at C3-4 and C4-5. There is partial bony fusion across the C6-7 disc space. Prominent spondylosis at C5-6. Upper chest: Airway is patent. Patchy dependent ground-glass airspace disease within the left upper lobe could be hypoventilatory. Other: Reconstructed images demonstrate no additional findings. IMPRESSION: 1. No acute cervical spine fracture. 2. Multilevel spondylosis and facet hypertrophy. 3. Dependent left upper lobe ground-glass consolidation, which could be related to hypoventilatory change or developing airspace disease. Electronically Signed   By: Randa Ngo M.D.   On: 07/29/2021 16:09  ? ?DG Shoulder Left ? ?Result Date: 07/29/2021 ?CLINICAL DATA:  Fell, pain EXAM: LEFT SHOULDER - 2+ VIEW COMPARISON:  None. FINDINGS: Frontal and transscapular views of the left shoulder are obtained. No fracture, subluxation, or dislocation. Mild glenohumeral and acromioclavicular joint osteoarthritis. Visualized portions of the left chest are clear. IMPRESSION: 1. Osteoarthritis.  No acute fracture. Electronically Signed   By: Randa Ngo  M.D.   On: 07/29/2021 16:27  ? ?DG HIP UNILAT WITH PELVIS 2-3 VIEWS LEFT ? ?Result Date: 07/30/2021 ?CLINICAL DATA:  Status post left hip arthroplasty. EXAM: DG HIP (WITH OR WITHOUT PELVIS) 2-3V LEFT COMPARISON:  07/29/2021. FINDINGS: New left hip hemiarthroplasty appears well seated and aligned. No acute fracture or evidence of an operative complication. IMPRESSION: Well-positioned left hip hemiarthroplasty. Electronically Signed   By: Lajean Manes M.D.   On: 07/30/2021 12:17  ? ?DG FEMUR MIN 2 VIEWS LEFT ? ?Result Date: 07/29/2021 ?CLINICAL DATA:  Fell, pain EXAM: LEFT FEMUR 2 VIEWS COMPARISON:  None. FINDINGS: Frontal and cross-table lateral views of the left femur are obtained.  There is a subcapital left femoral neck fracture with impaction and varus angulation at the fracture site. No other acute bony abnormalities. Alignment of the left knee is anatomic. IMPRESSION: 1. Subcapital le

## 2021-07-31 NOTE — Progress Notes (Signed)
[place] at [facility] ? ? ?PATIENT NAME: Jessica Long   ? ?MR#:  [id number] ? ?DATE OF BIRTH:  May 06, 1946 ? ?SUBJECTIVE:  ?came in from [facility] assisted [facility] after mechanical fall ? No family at bedside ?Pt eating BF ?Evaluated by PT ? ?VITALS:  ?Blood pressure 115/64, pulse 69, temperature 98.2 ?F (36.8 ?C), resp. rate 15, height '5\' 8"'$  (1.727 m), weight 77.1 kg, SpO2 91 %. ? ?PHYSICAL EXAMINATION:  ? ?GENERAL:  76 y.o.-year-old patient lying in the bed with no acute distress.  ?LUNGS: decreased breath sounds bilaterally, no wheezing, rales, rhonchi.  ?CARDIOVASCULAR: S1, S2 normal. No murmurs, rubs, or gallops.  ?ABDOMEN: Soft, nontender, nondistended. Bowel sounds present.  ?EXTREMITIES: No  edema b/l.    ?NEUROLOGIC: nonfocal  patient is alert and awake ?SKIN: No obvious rash, lesion, or ulcer--per RN ? ?LABORATORY PANEL:  ?CBC ?Recent Labs  ?Lab 07/31/21 ?2482  ?WBC 7.6  ?HGB 11.2*  ?HCT 33.0*  ?PLT 203  ? ? ? ?Chemistries  ?Recent Labs  ?Lab 07/29/21 ?1527 07/30/21 ?0444 07/31/21 ?0449  ?NA 129*   < > 127*  ?K 3.6   < > 4.2  ?CL 93*   < > 95*  ?CO2 25   < > 25  ?GLUCOSE 116*   < > 124*  ?BUN 14   < > 13  ?CREATININE 1.38*   < > 0.89  ?CALCIUM 8.9   < > 8.6*  ?AST 26  --   --   ?ALT 27  --   --   ?ALKPHOS 49  --   --   ?BILITOT 0.9  --   --   ? < > = values in this interval not displayed.  ? ? ?Cardiac Enzymes ?No results for input(s): TROPONINI in the last 168 hours. ?RADIOLOGY:  ?DG Chest 1 View ? ?Result Date: 07/29/2021 ?CLINICAL DATA:  [place] on the left side EXAM: CHEST  1 VIEW COMPARISON:  07/23/2021 FINDINGS: Single frontal view of the chest demonstrates a stable cardiac silhouette. No airspace disease, effusion, or pneumothorax. No acute bony abnormality. IMPRESSION: 1. No acute intrathoracic process. Electronically Signed   By: Randa Ngo M.D.   On: 07/29/2021 16:30  ? ?DG Pelvis 1-2 Views ? ?Result Date: 07/29/2021 ?CLINICAL DATA:  Fell, pain EXAM: PELVIS - 1-2 VIEW  COMPARISON:  None. FINDINGS: Single frontal view of the pelvis demonstrates a subcapital left femoral neck fracture with impaction and varus angulation at the fracture site. No dislocation. The right hip is unremarkable. The remainder of the bony pelvis is normal. IMPRESSION: 1. Impacted subcapital left femoral neck fracture. Electronically Signed   By: Randa Ngo M.D.   On: 07/29/2021 16:27  ? ?CT Head Wo Contrast ? ?Result Date: 07/29/2021 ?CLINICAL DATA:  [place], head trauma EXAM: CT HEAD WITHOUT CONTRAST TECHNIQUE: Contiguous axial images were obtained from the base of the skull through the vertex without intravenous contrast. RADIATION DOSE REDUCTION: This exam was performed according to the departmental dose-optimization program which includes automated exposure control, adjustment of the mA and/or kV according to patient size and/or use of iterative reconstruction technique. COMPARISON:  06/10/2014 FINDINGS: Brain: No acute infarct or hemorrhage. Focal hypodensities in the left frontal periventricular white matter consistent with chronic small vessel ischemic change. Lateral ventricles are unremarkable. 4 mm hyperdense region in the third ventricle reference image 20/2 compatible with colloid cyst. Previously this had measured up to 13 mm. No evidence of mass effect or hydrocephalus. No acute  extra-axial fluid collections. No mass effect. Vascular: No hyperdense vessel or unexpected calcification. Skull: Normal. Negative for fracture or focal lesion. Sinuses/Orbits: There is diffuse mucosal thickening with partial opacification of the frontal, ethmoid, maxillary sinuses. Small gas fluid levels are seen within the maxillary sinuses bilaterally. Other: None. IMPRESSION: 1. No acute intracranial process. 2. 4 mm hyperdense colloid cyst, decreased in size since prior study where this measured up to 13 mm. 3. Diffuse paranasal sinus disease. Electronically Signed   By: Randa Ngo M.D.   On: 07/29/2021 16:12   ? ?CT Cervical Spine Wo Contrast ? ?Result Date: 07/29/2021 ?CLINICAL DATA:  [place], head trauma EXAM: CT CERVICAL SPINE WITHOUT CONTRAST TECHNIQUE: Multidetector CT imaging of the cervical spine was performed without intravenous contrast. Multiplanar CT image reconstructions were also generated. RADIATION DOSE REDUCTION: This exam was performed according to the departmental dose-optimization program which includes automated exposure control, adjustment of the mA and/or kV according to patient size and/or use of iterative reconstruction technique. COMPARISON:  None. FINDINGS: Alignment: There is straightening of the lower cervical spine likely due to multilevel spondylosis. Otherwise alignment is anatomic. Skull base and vertebrae: No acute fracture. No primary bone lesion or focal pathologic process. Soft tissues and spinal canal: No prevertebral fluid or swelling. No visible canal hematoma. Disc levels: Multilevel spondylosis and facet hypertrophy. There is bony fusion across the C2-3 facet joints. Prominent facet hypertrophic changes are seen at C3-4 and C4-5. There is partial bony fusion across the C6-7 disc space. Prominent spondylosis at C5-6. Upper chest: Airway is patent. Patchy dependent ground-glass airspace disease within the left upper lobe could be hypoventilatory. Other: Reconstructed images demonstrate no additional findings. IMPRESSION: 1. No acute cervical spine fracture. 2. Multilevel spondylosis and facet hypertrophy. 3. Dependent left upper lobe ground-glass consolidation, which could be related to hypoventilatory change or developing airspace disease. Electronically Signed   By: Randa Ngo M.D.   On: 07/29/2021 16:09  ? ?DG Shoulder Left ? ?Result Date: 07/29/2021 ?CLINICAL DATA:  Fell, pain EXAM: LEFT SHOULDER - 2+ VIEW COMPARISON:  None. FINDINGS: Frontal and transscapular views of the left shoulder are obtained. No fracture, subluxation, or dislocation. Mild glenohumeral and  acromioclavicular joint osteoarthritis. Visualized portions of the left chest are clear. IMPRESSION: 1. Osteoarthritis.  No acute fracture. Electronically Signed   By: Randa Ngo M.D.   On: 07/29/2021 16:27  ? ?DG HIP UNILAT WITH PELVIS 2-3 VIEWS LEFT ? ?Result Date: 07/30/2021 ?CLINICAL DATA:  Status post left hip arthroplasty. EXAM: DG HIP (WITH OR WITHOUT PELVIS) 2-3V LEFT COMPARISON:  07/29/2021. FINDINGS: New left hip hemiarthroplasty appears well seated and aligned. No acute fracture or evidence of an operative complication. IMPRESSION: Well-positioned left hip hemiarthroplasty. Electronically Signed   By: Lajean Manes M.D.   On: 07/30/2021 12:17  ? ?DG FEMUR MIN 2 VIEWS LEFT ? ?Result Date: 07/29/2021 ?CLINICAL DATA:  Fell, pain EXAM: LEFT FEMUR 2 VIEWS COMPARISON:  None. FINDINGS: Frontal and cross-table lateral views of the left femur are obtained. There is a subcapital left femoral neck fracture with impaction and varus angulation at the fracture site. No other acute bony abnormalities. Alignment of the left knee is anatomic. IMPRESSION: 1. Subcapital left femoral neck fracture with impaction and varus angulation. Electronically Signed   By: Randa Ngo M.D.   On: 07/29/2021 16:28   ? ?Assessment and Plan ? Jessica Long is a 76 y.o. female with medical history significant of dCHF, A fib on Coumadin, HTL, COPD, stroke, depression,  CKD-3A, breast cancer, kidney stone, chronic hyponatremia, who presents with fall ?Pt states that she accidentally lost her balance and fell in the facility.  She fell on her left side, injured her left hip and left shoulder. ? ?X-ray of left shoulder is negative.  ?X-ray of pelvis and left femur showed subcapital left femoral neck fracture with impaction and varus angulation. ? ?Close left hip fracture status post mechanical fall ?-- POD 0 left hip hemi arthroplasty ?-- orthopedic consultation with Dr. Sabra Heck appreciated ?-- okay to resume Coumadin tonight per Dr.  Sabra Heck ?-- PRN pain meds ?-- bowel regimen ?-- PT OT and TOC for discharge planning ?--3/27-- Worked with Pt yday-- Rehab recommended ? ?chronic hyponatremia ?-- sodium at baseline 127--132 ?-- stable ? ?chronic a fib ?-- conti

## 2021-07-31 NOTE — Consult Note (Signed)
ANTICOAGULATION CONSULT NOTE - Initial Consult ? ?Pharmacy Consult for warfarin ?Indication: atrial fibrillation ? ?Allergies  ?Allergen Reactions  ? Amoxicillin Swelling  ?  Oral swelling  ? Ampicillin Swelling  ?  Oral swelling  ? Ketamine Hcl Anxiety  ? ? ?Patient Measurements: ?Height: '5\' 8"'$  (172.7 cm) ?Weight: 77.1 kg (170 lb) ?IBW/kg (Calculated) : 63.9 ? ? ?Vital Signs: ?Temp: 98 ?F (36.7 ?C) (03/27 0341) ?BP: 107/62 (03/27 0341) ?Pulse Rate: 73 (03/27 0342) ? ?Labs: ?Recent Labs  ?  07/29/21 ?1527 07/30/21 ?0444 07/30/21 ?1319 07/31/21 ?0449  ?HGB 13.2 13.5 12.6 11.2*  ?HCT 38.9 39.8 37.8 33.0*  ?PLT 262 241 250 203  ?APTT 34  --   --   --   ?LABPROT 22.7* 15.3*  --  14.6  ?INR 2.0* 1.2  --  1.1  ?CREATININE 1.38* 1.06* 1.00 0.89  ? ? ? ?Estimated Creatinine Clearance: 56.9 mL/min (by C-G formula based on SCr of 0.89 mg/dL). ? ? ?Medical History: ?Past Medical History:  ?Diagnosis Date  ? Allergic rhinitis   ? Aortic atherosclerosis ([place])   ? Atrial fibrillation ([place])   ? Breast cancer, right ([place])   ? CHF (congestive heart failure) ([place])   ? CKD (chronic kidney disease)   ? Current use of long term anticoagulation   ? Warfarin  ? Depression   ? History of kidney stones   ? Hyperlipemia   ? Osteoarthritis   ? Stroke [facility]) 2011  ? ? ?Medications:  ?PTA warfarin reg: 2.5 mg daily ?3/25 IV Vitamin K 10 mg x 1 given  ? ?DDI: doxycycline  ? ?Assessment: ?76 y.o. female with medical history significant of dCHF, A fib on Coumadin regimen as above presented to ED 07/29/21 with left hip fracture following a fall. INR 2.0 (therapeutic) on admission. Patient given 10 mg IV vitamin K for reversal and underwent hip hemiarthroplasty 07/30/21. Pharmacy consulted to restart warfarin therapy post-op. ? ?Date INR Warfarin Dose ?3/25 2.0 Held: IV Vitamin K 10 mg given  ?3/26 1.2 2.5 mg  ?3/27 1.1 5 mg  ? ?Goal of Therapy:  ?INR 2-3 ?Monitor platelets by anticoagulation protocol: Yes ?  ?Plan:  ?Warfarin 5 mg x 1  tonight ?Anticipate INR resistance due to Vitamin K dose given 07/30/21 ?No bridge per provider ?INR daily ? ?Dorothe Pea, PharmD, BCPS ?Clinical Pharmacist   ?07/31/2021,7:18 AM ? ? ?

## 2021-07-31 NOTE — TOC Progression Note (Signed)
Transition of Care (TOC) - Progression Note  ? ? ?Patient Details  ?Name: GRACIEANN STANNARD ?MRN: [id number] ?Date of Birth: [birth year] ? ?Transition of Care (TOC) CM/SW Contact  ?Conception Oms, RN ?Phone Number: ?07/31/2021, 4:44 PM ? ?Clinical Narrative:   Spoke with the patient and her Niece and reviewed ech of the bed offers and Medicare Star rating, they chose [facility] and rehab, I notified [facility], and started the ins process to obtain approval  ? ? ? ?Expected Discharge Plan: [place] ?Barriers to Discharge: Continued Medical Work up ? ?Expected Discharge Plan and Services ?Expected Discharge Plan: [place] ?  ?  ?  ?  ?                ?  ?  ?  ?  ?  ?  ?  ?  ?  ?  ? ? ?Social Determinants of Health (SDOH) Interventions ?  ? ?Readmission Risk Interventions ? ?  07/31/2021  ?  2:43 PM  ?Readmission Risk Prevention Plan  ?Transportation Screening Complete  ?PCP or Specialist Appt within 3-5 Days Complete  ?[place] or Home Care Consult Complete  ?Palliative Care Screening Not Applicable  ?Medication Review Press photographer) Referral to Pharmacy  ? ? ?

## 2021-07-31 NOTE — Progress Notes (Signed)
Initial Nutrition Assessment ? ?DOCUMENTATION CODES:  ? ?Not applicable ? ?INTERVENTION:  ?- Encourage PO intake ?- Ensure Enlive po BID, each supplement provides 350 kcal and 20 grams of protein (chocolate) ? ?NUTRITION DIAGNOSIS:  ? ?Increased nutrient needs related to hip fracture, post-op healing as evidenced by estimated needs. ? ?GOAL:  ? ?Patient will meet greater than or equal to 90% of their needs ? ?MONITOR:  ? ?PO intake, Supplement acceptance, Labs, Weight trends ? ?REASON FOR ASSESSMENT:  ? ?Consult ?Assessment of nutrition requirement/status, Hip fracture protocol ? ?ASSESSMENT:  ? ?Pt admitted 3/25 from ALF after losing her balance and falling causing L hip fracture. PMH significant for CHF, afib on coumadin, HTL, COPD, stroke, depression, CKD IIIa, breast CA, kidney stone, and  chronic hyponatremia. ? ?3/26-s/p L hip hemiarthroplasty with stryker accolade prosthesis ? ?Noted plans to d/c to SNF.  ? ?Pt reports eating well over all during admission as she enjoys the food. She states sometimes she eats a little less as she sometimes feels out of breath causing early satiety which she experiences from time to time.  ?This morning pt had a cheese omelette and biscuit and reports eating half of this meal. PTA she states that she was eating well and at her baseline. Nanine Means provides 3 meals per day and she states that she usually eats most all of these meals. If she has a big breakfast and lunch, she may only eat a side salad for dinner. ?Meal completions 75-100% x 2 recorded meals. ? ?Pt states she is weighed 1-2 x a week at ALF given history of CHF. Reports a usual weight of 179 lbs and denies significant weight loss. Weight history reviewed. 5% weight loss noted since 3/18. Unsure how much of this is true dry weight loss versus volume status. Will continue to monitor throughout admission. ? ?Documentation noted of mild pitting BLE edema.  ? ?Medications: cholecalciferol, colace, doxycycline, pepcid,  ferrous sulfate, MVI, torsemide, Vitamin B12, warfarin ? ?Labs: sodium 127, calcium 8.6 ? ?UOP: 831m x12 hours ? ?I/O's: +2250msince admission ? ?NUTRITION - FOCUSED PHYSICAL EXAM: ?RD working remotely- deferred to follow up ? ?Diet Order:   ?Diet Order   ? ?       ?  Diet regular Room service appropriate? Yes; Fluid consistency: Thin  Diet effective now       ?  ? ?  ?  ? ?  ? ? ?EDUCATION NEEDS:  ? ?No education needs have been identified at this time ? ?Skin:  Skin Assessment: Skin Integrity Issues: ?Skin Integrity Issues:: Incisions ?Incisions: L hip (closed) ? ?Last BM:  3/27 (type 7) ? ?Height:  ? ?Ht Readings from Last 1 Encounters:  ?07/29/21 '5\' 8"'$  (1.727 m)  ? ? ?Weight:  ? ?Wt Readings from Last 1 Encounters:  ?07/29/21 77.1 kg  ? ?BMI:  Body mass index is 25.85 kg/m?. ? ?Estimated Nutritional Needs:  ? ?Kcal:  1900-2100 ? ?Protein:  95-110g ? ?Fluid:  >/=1.9L ? ?AlClayborne DanaRDN, LDN ?Clinical Nutrition ?

## 2021-08-01 LAB — BASIC METABOLIC PANEL
Anion gap: 6 (ref 5–15)
BUN: 14 mg/dL (ref 8–23)
CO2: 27 mmol/L (ref 22–32)
Calcium: 8.1 mg/dL — ABNORMAL LOW (ref 8.9–10.3)
Chloride: 96 mmol/L — ABNORMAL LOW (ref 98–111)
Creatinine, Ser: 0.98 mg/dL (ref 0.44–1.00)
GFR, Estimated: 59 mL/min — ABNORMAL LOW (ref >60)
Glucose, Bld: 107 mg/dL — ABNORMAL HIGH (ref 70–99)
Potassium: 3.6 mmol/L (ref 3.5–5.1)
Sodium: 129 mmol/L — ABNORMAL LOW (ref 135–145)

## 2021-08-01 LAB — CBC
HCT: 32.8 % — ABNORMAL LOW (ref 36.0–46.0)
Hemoglobin: 10.9 g/dL — ABNORMAL LOW (ref 12.0–15.0)
MCH: 30.4 pg (ref 26.0–34.0)
MCHC: 33.2 g/dL (ref 30.0–36.0)
MCV: 91.4 fL (ref 80.0–100.0)
Platelets: 197 10*3/uL (ref 150–400)
RBC: 3.59 MIL/uL — ABNORMAL LOW (ref 3.87–5.11)
RDW: 15.7 % — ABNORMAL HIGH (ref 11.5–15.5)
WBC: 7.6 10*3/uL (ref 4.0–10.5)
nRBC: 0 % (ref 0.0–0.2)

## 2021-08-01 LAB — PROTIME-INR
INR: 1.5 — ABNORMAL HIGH (ref 0.8–1.2)
Prothrombin Time: 17.8 seconds — ABNORMAL HIGH (ref 11.4–15.2)

## 2021-08-01 LAB — SURGICAL PATHOLOGY

## 2021-08-01 MED ORDER — ACETAMINOPHEN 500 MG PO TABS: 1000.0000 mg | ORAL_TABLET | Freq: Four times a day (QID) | ORAL | 0 refills | Status: DC | PRN

## 2021-08-01 MED ORDER — WARFARIN SODIUM 5 MG PO TABS
5.0000 mg | ORAL_TABLET | Freq: Once | ORAL | Status: DC
  Filled 2021-08-01: qty 1

## 2021-08-01 MED ORDER — WARFARIN SODIUM 5 MG PO TABS
5.0000 mg | ORAL_TABLET | Freq: Once | ORAL | Status: AC
  Administered 2021-08-01: 5 mg via ORAL
  Filled 2021-08-01: qty 1

## 2021-08-01 MED ORDER — OXYCODONE HCL 5 MG PO TABS: 5.0000 mg | ORAL_TABLET | Freq: Four times a day (QID) | ORAL | 0 refills | Status: AC | PRN

## 2021-08-01 MED ORDER — SENNOSIDES-DOCUSATE SODIUM 8.6-50 MG PO TABS: 1.0000 | ORAL_TABLET | Freq: Every day | ORAL | Status: AC

## 2021-08-01 MED ORDER — FERROUS SULFATE 325 (65 FE) MG PO TABS: 325.0000 mg | ORAL_TABLET | Freq: Every day | ORAL | 3 refills | Status: AC

## 2021-08-01 NOTE — Hospital Course (Signed)
Jessica Long is a 76 y.o. female with medical history significant of dCHF, A fib on Coumadin, HLD, COPD, stroke, depression, CKD-3A, breast cancer, kidney stone, chronic hyponatremia, who presented after a mechanical fall.  ?Imaging showed a subcapital left femoral neck fracture with impaction. ?She was evaluated by orthopedic surgery and underwent left hip hemiarthroplasty on 07/30/2021. ?Coumadin was resumed postop and she will need repeat INR in about 2 to 3 days. ?She worked well with physical therapy with recommendations for SNF at discharge. ?

## 2021-08-01 NOTE — Plan of Care (Addendum)
?  Patient is alert and oriented X 3-4 and showing no signs of pain or discomfort at this time. During this shift patient was given PRN Zofran for vomiting, please see MAR. Patients IV access line in left mid arm was replaced during this shift by IV team. Call bell was left within reach.  ? ?Problem: Education: ?Goal: Knowledge of General Education information will improve ?Description: Including pain rating scale, medication(s)/side effects and non-pharmacologic comfort measures ?Outcome: Progressing ?  ?Problem: Health Behavior/Discharge Planning: ?Goal: Ability to manage health-related needs will improve ?Outcome: Progressing ?  ?Problem: Clinical Measurements: ?Goal: Ability to maintain clinical measurements within normal limits will improve ?Outcome: Progressing ?  ?Problem: Clinical Measurements: ?Goal: Will remain free from infection ?Outcome: Progressing ?  ?Problem: Clinical Measurements: ?Goal: Diagnostic test results will improve ?Outcome: Progressing ?  ? ?Problem: Nutrition: ?Goal: Adequate nutrition will be maintained ?Outcome: Progressing ?  ?Problem: Coping: ?Goal: Level of anxiety will decrease ?Outcome: Progressing ?  ?Problem: Elimination: ?Goal: Will not experience complications related to bowel motility ?Outcome: Progressing ?  ?Problem: Clinical Measurements: ?Goal: Cardiovascular complication will be avoided ?Outcome: Progressing ?  ?Problem: Pain Managment: ?Goal: General experience of comfort will improve ?Outcome: Progressing ?  ?Problem: Safety: ?Goal: Ability to remain free from injury will improve ?Outcome: Progressing ?  ?Problem: Skin Integrity: ?Goal: Risk for impaired skin integrity will decrease ?Outcome: Progressing ?  ?

## 2021-08-01 NOTE — Progress Notes (Signed)
EMS has transported patient to facility. ?

## 2021-08-01 NOTE — Progress Notes (Signed)
Subjective: ?2 Days Post-Op Procedure(s) (LRB): ?ARTHROPLASTY BIPOLAR HIP (HEMIARTHROPLASTY) (Left) ?  ?Patient is alert and awake sitting up in bed eating lunch.  She is stable for skilled nursing discharge today.  Dressing is dry.  Hemoglobin remained stable.  She has been out of bed each day.  Her Coumadin has been restarted.  We will see her in the office in 2 weeks for exam and x-ray and staple removal. ? ?Patient reports pain as mild. ? ?Objective:  ? ?VITALS:   ?Vitals:  ? 08/01/21 0807 08/01/21 1154  ?BP: 108/61 106/66  ?Pulse: 69 64  ?Resp: 18 18  ?Temp: 97.9 ?F (36.6 ?C) 97.7 ?F (36.5 ?C)  ?SpO2: 93% 95%  ? ? ?Neurologically intact ?Dorsiflexion/Plantar flexion intact ?Incision: dressing C/D/I ? ?LABS ?Recent Labs  ?  07/30/21 ?1319 07/31/21 ?0449 08/01/21 ?0405  ?HGB 12.6 11.2* 10.9*  ?HCT 37.8 33.0* 32.8*  ?WBC 10.6* 7.6 7.6  ?PLT 250 203 197  ? ? ?Recent Labs  ?  07/30/21 ?0444 07/30/21 ?1319 07/31/21 ?0449 08/01/21 ?0405  ?NA 128*  --  127* 129*  ?K 4.2  --  4.2 3.6  ?BUN 15  --  13 14  ?CREATININE 1.06* 1.00 0.89 0.98  ?GLUCOSE 165*  --  124* 107*  ? ? ?Recent Labs  ?  07/31/21 ?0449 08/01/21 ?0405  ?INR 1.1 1.5*  ? ? ? ?Assessment/Plan: ?2 Days Post-Op Procedure(s) (LRB): ?ARTHROPLASTY BIPOLAR HIP (HEMIARTHROPLASTY) (Left) ? ? ?Advance diet ?Up with therapy ?Discharge to SNF ?Return to my office in 2 weeks for exam, x-ray, staple removal. ?Partial weightbearing 50% left leg with walker ? ?

## 2021-08-01 NOTE — TOC Progression Note (Signed)
Transition of Care (TOC) - Progression Note  ? ? ?Patient Details  ?Name: Jessica Long ?MRN: [id number] ?Date of Birth: 12-Jan-1946 ? ?Transition of Care (TOC) CM/SW Contact  ?Conception Oms, RN ?Phone Number: ?08/01/2021, 1:17 PM ? ?Clinical Narrative:   The patient is going to [facility] today room 603, EMS called, Her Niece June made aware ? ? ? ?Expected Discharge Plan: [place] ?Barriers to Discharge: Continued Medical Work up ? ?Expected Discharge Plan and Services ?Expected Discharge Plan: [place] ?  ?  ?  ?  ?Expected Discharge Date: 08/01/21               ?  ?  ?  ?  ?  ?  ?  ?  ?  ?  ? ? ?Social Determinants of Health (SDOH) Interventions ?  ? ?Readmission Risk Interventions ? ?  07/31/2021  ?  2:43 PM  ?Readmission Risk Prevention Plan  ?Transportation Screening Complete  ?PCP or Specialist Appt within 3-5 Days Complete  ?[place] or Home Care Consult Complete  ?Palliative Care Screening Not Applicable  ?Medication Review Press photographer) Referral to Pharmacy  ? ? ?

## 2021-08-01 NOTE — Consult Note (Signed)
ANTICOAGULATION CONSULT NOTE - Initial Consult ? ?Pharmacy Consult for warfarin ?Indication: atrial fibrillation ? ?Allergies  ?Allergen Reactions  ? Amoxicillin Swelling  ?  Oral swelling  ? Ampicillin Swelling  ?  Oral swelling  ? Ketamine Hcl Anxiety  ? ? ?Patient Measurements: ?Height: '5\' 8"'$  (172.7 cm) ?Weight: 77.1 kg (170 lb) ?IBW/kg (Calculated) : 63.9 ? ? ?Vital Signs: ?Temp: 98.2 ?F (36.8 ?C) (03/28 0409) ?BP: 137/78 (03/28 0409) ?Pulse Rate: 68 (03/28 0409) ? ?Labs: ?Recent Labs  ?  07/29/21 ?1527 07/30/21 ?0444 07/30/21 ?1319 07/31/21 ?0449 08/01/21 ?0405  ?HGB 13.2 13.5 12.6 11.2* 10.9*  ?HCT 38.9 39.8 37.8 33.0* 32.8*  ?PLT 262 241 250 203 197  ?APTT 34  --   --   --   --   ?LABPROT 22.7* 15.3*  --  14.6 17.8*  ?INR 2.0* 1.2  --  1.1 1.5*  ?CREATININE 1.38* 1.06* 1.00 0.89 0.98  ? ? ? ?Estimated Creatinine Clearance: 51.7 mL/min (by C-G formula based on SCr of 0.98 mg/dL). ? ? ?Medical History: ?Past Medical History:  ?Diagnosis Date  ? Allergic rhinitis   ? Aortic atherosclerosis ([place])   ? Atrial fibrillation ([place])   ? Breast cancer, right ([place])   ? CHF (congestive heart failure) ([place])   ? CKD (chronic kidney disease)   ? Current use of long term anticoagulation   ? Warfarin  ? Depression   ? History of kidney stones   ? Hyperlipemia   ? Osteoarthritis   ? Stroke [facility]) 2011  ? ? ?Medications:  ?PTA warfarin reg: 2.5 mg daily ?3/25 IV Vitamin K 10 mg x 1 given  ? ?DDI: doxycycline  ? ?Assessment: ?76 y.o. female with medical history significant of dCHF, A fib on Coumadin regimen as above presented to ED 07/29/21 with left hip fracture following a fall. INR 2.0 (therapeutic) on admission. Patient given 10 mg IV vitamin K for reversal and underwent hip hemiarthroplasty 07/30/21. Pharmacy consulted to restart warfarin therapy post-op. ? ?Date INR Warfarin Dose ?3/25 2.0 Held: IV Vitamin K 10 mg given  ?3/26 1.2 2.5 mg  ?3/27 1.1 5 mg  ?3/28 1.5 5 mg ? ?Goal of Therapy:  ?INR 2-3 ?Monitor platelets by  anticoagulation protocol: Yes ?  ?Plan: 08/01/21 ?Warfarin 5 mg x 1 today ?Plan for possible discharge 08/01/21 ?Anticipate able to return to regular home regimen of 2.5 mg daily starting tomorrow 08/02/21 ?Recommend checking INR in 2-3 days after discharge  ? ?Dorothe Pea, PharmD, BCPS ?Clinical Pharmacist   ?08/01/2021,7:40 AM ? ? ?

## 2021-08-01 NOTE — Progress Notes (Signed)
[facility] and report was given to nurse [employer], Patient is waiting on transport to facility. A dry dressing was placed on operative site, will continue to assess and monitor. ?

## 2021-08-01 NOTE — Progress Notes (Signed)
Occupational Therapy Treatment ?Patient Details ?Name: Jessica Long ?MRN: [id number] ?DOB: 06/02/1945 ?Today's Date: 08/01/2021 ? ? ?History of present illness Pt is a 76 y.o. female who presents to the ED via EMS w/ complaints of left hip and shoulder pain due to fall on 3/25. Currently admitted due to closed L hip fx and hemiarthroplasty. PmHx: A. Fib, Breast Cancer, CHF, CKD, Depression, HLD, OA, CVA. ?  ?OT comments ? Pt seen for OT tx this date. Pt received sleeping in bed, wakes easily, and agreeable to session. Pt required MOD A + VC for hand placemen for sup>sit EOB. Pt assisted with taking morning medication seated EOB by RN and OT supporting sitting balance, following commands, and safety with medication mgt to minimize risk of aspiration. Pt required MAX A + MAX VC for standing while maintaining precautions from elevated bed, an increase in assist required from previous date/session. Once in standing pt required MIN A + MAX VC for sequencing with RW from EOB to the recliner while maintaining precautions. Pt continues to required skilled OT services to maximize return to PLOF.   ? ?Recommendations for follow up therapy are one component of a multi-disciplinary discharge planning process, led by the attending physician.  Recommendations may be updated based on patient status, additional functional criteria and insurance authorization. ?   ?Follow Up Recommendations ? Skilled nursing-short term rehab (<3 hours/day)  ?  ?Assistance Recommended at Discharge Frequent or constant Supervision/Assistance  ?Patient can return home with the following ? A lot of help with walking and/or transfers;A lot of help with bathing/dressing/bathroom;Assist for transportation;Assistance with cooking/housework;Direct supervision/assist for medications management;Help with stairs or ramp for entrance ?  ?Equipment Recommendations ? BSC/3in1;Other (comment) (2WW, reacher)  ?  ?Recommendations for Other Services   ? ?  ?Precautions  / Restrictions Precautions ?Precautions: Posterior Hip;Fall ?Precaution Comments: pt recalled 2/3 at start of session, requiring cue for 3rd ?Restrictions ?Weight Bearing Restrictions: Yes ?LLE Weight Bearing: Partial weight bearing ?LLE Partial Weight Bearing Percentage or Pounds: 50% per chart  ? ? ?  ? ?Mobility Bed Mobility ?Overal bed mobility: Needs Assistance ?Bed Mobility: Supine to Sit ?  ?  ?Supine to sit: Mod assist ?  ?  ?  ?  ? ?Transfers ?Overall transfer level: Needs assistance ?Equipment used: Rolling walker (2 wheels) ?Transfers: Sit to/from Stand, Bed to chair/wheelchair/BSC ?  ?  ?  ?Step pivot transfers: Max assist, From elevated surface ?  ?  ?General transfer comment: MAX A + max VC to come to full upright stand with RW ?  ?  ?Balance Overall balance assessment: Needs assistance ?Sitting-balance support: Bilateral upper extremity supported, Feet supported, Single extremity supported ?Sitting balance-Leahy Scale: Fair ?  ?  ?Standing balance support: Bilateral upper extremity supported, During functional activity, Reliant on assistive device for balance ?Standing balance-Leahy Scale: Poor ?Standing balance comment: heavily reliant on RW and still requiring support ?  ?  ?  ?  ?  ?  ?  ?  ?  ?  ?  ?   ? ?ADL either performed or assessed with clinical judgement  ? ?ADL Overall ADL's : Needs assistance/impaired ?  ?  ?  ?  ?  ?  ?  ?  ?  ?  ?  ?  ?  ?  ?Toileting- Clothing Manipulation and Hygiene: Maximal assistance;Sit to/from stand ?  ?  ?  ?  ?  ?  ? ?Extremity/Trunk Assessment   ?  ?  ?  ?  ?  ? ?  Vision   ?  ?  ?Perception   ?  ?Praxis   ?  ? ?Cognition Arousal/Alertness: Awake/alert ?Behavior During Therapy: [facility] for tasks assessed/performed ?Overall Cognitive Status: Within Functional Limits for tasks assessed ?  ?  ?  ?  ?  ?  ?  ?  ?  ?  ?  ?  ?  ?  ?  ?  ?General Comments: hyperverbal requiring cues to redirect to task ?  ?  ?   ?Exercises Other Exercises ?Other Exercises: Pt further  instructed in posterior THPs and how to maintain during LB ADL tasks with AE, VC to redirect to task at times ? ?  ?Shoulder Instructions   ? ? ?  ?General Comments    ? ? ?Pertinent Vitals/ Pain       Pain Assessment ?Pain Assessment: 0-10 ?Pain Score: 7  ?Pain Location: L hip ?Pain Descriptors / Indicators: Guarding, Discomfort, Moaning, Restless, Grimacing ?Pain Intervention(s): Limited activity within patient's tolerance, Monitored during session, Premedicated before session, Repositioned ? ?Home Living   ?  ?  ?  ?  ?  ?  ?  ?  ?  ?  ?  ?  ?  ?  ?  ?  ?  ?  ? ?  ?Prior Functioning/Environment    ?  ?  ?  ?   ? ?Frequency ? Min 2X/week  ? ? ? ? ?  ?Progress Toward Goals ? ?OT Goals(current goals can now be found in the care plan section) ? Progress towards OT goals: OT to reassess next treatment ? ?Acute Rehab OT Goals ?Patient Stated Goal: have less pain and get better ?OT Goal Formulation: With patient ?Time For Goal Achievement: 08/14/21 ?Potential to Achieve Goals: Good  ?Plan Discharge plan remains appropriate;Frequency remains appropriate   ? ?Co-evaluation ? ? ?   ?  ?  ?  ?  ? ?  ?AM-PAC OT "6 Clicks" Daily Activity     ?Outcome Measure ? ? Help from another person eating meals?: None ?Help from another person taking care of personal grooming?: A Little ?Help from another person toileting, which includes using toliet, bedpan, or urinal?: A Lot ?Help from another person bathing (including washing, rinsing, drying)?: A Lot ?Help from another person to put on and taking off regular upper body clothing?: A Little ?Help from another person to put on and taking off regular lower body clothing?: A Lot ?6 Click Score: 16 ? ?  ?End of Session Equipment Utilized During Treatment: Gait belt;Rolling walker (2 wheels);Oxygen ? ?OT Visit Diagnosis: Other abnormalities of gait and mobility (R26.89);Muscle weakness (generalized) (M62.81);History of falling (Z91.81);Pain ?Pain - Right/Left: Left ?Pain - part of body:  Hip ?  ?Activity Tolerance Patient limited by pain ?  ?Patient Left in chair;with call bell/phone within reach;with chair alarm set;Other (comment) (pillow between legs) ?  ?Nurse Communication   ?  ? ?   ? ?Time: 3500-9381 ?OT Time Calculation (min): 39 min ? ?Charges: OT General Charges ?$OT Visit: 1 Visit ?OT Treatments ?$Self Care/Home Management : 38-52 mins ? ?Ardeth Perfect., MPH, MS, OTR/L ?ascom [phone] ?08/01/21, 9:41 AM ?

## 2021-08-01 NOTE — Care Management Important Message (Signed)
Important Message ? ?Patient Details  ?Name: Jessica Long ?MRN: [id number] ?Date of Birth: 1946/04/21 ? ? ?Medicare Important Message Given:  Yes ? ? ? ? ?Juliann Pulse A Malayshia All ?08/01/2021, 12:51 PM ?

## 2021-08-01 NOTE — Discharge Summary (Signed)
?Physician Discharge Summary ?  ?Patient: Jessica Long MRN: [id number] DOB: Feb 11, 1946  ?Admit date:     07/29/2021  ?Discharge date: 08/01/21  ?Discharge Physician: Dwyane Dee  ? ?PCP: [facility]  ? ?Recommendations at discharge:  ? ?Repeat INR in 2-3 days (by Friday) ?Follow up with orthopedic surgery within 2 weeks ? ?Discharge Diagnoses: ?Principal Problem: ?  Closed left hip fracture ([place]) ?Active Problems: ?  Chronic hyponatremia ?  Atrial fibrillation, chronic ([place]) ?  Depression ?  COPD (chronic obstructive pulmonary disease) with emphysema ([place]) ?  History of CVA (cerebrovascular accident) ?  Fall ?  Acute renal failure superimposed on stage 3a chronic kidney disease ([place]) ?  Chronic diastolic CHF (congestive heart failure) ([place]) ?  Acute pain of left shoulder ? ?Resolved Problems: ?  * No resolved hospital problems. * ? ?Hospital Course: ?Jessica Long is a 76 y.o. female with medical history significant of dCHF, A fib on Coumadin, HLD, COPD, stroke, depression, CKD-3A, breast cancer, kidney stone, chronic hyponatremia, who presented after a mechanical fall.  ?Imaging showed a subcapital left femoral neck fracture with impaction. ?She was evaluated by orthopedic surgery and underwent left hip hemiarthroplasty on 07/30/2021. ?Coumadin was resumed postop and she will need repeat INR in about 2 to 3 days. ?She worked well with physical therapy with recommendations for SNF at discharge. ? ?Assessment and Plan: ?  ?Close left hip fracture status post mechanical fall ?-- s/p left hip hemi arthroplasty ?-- outpatient follow up with orthopedic surgery ?- continue coumadin ?- continue pain control  ?  ?Chronic hyponatremia ?-- sodium at baseline 127--132 ?-- stable ?  ?Chronic a fib ?-- continue metoprolol ?- continue Coumadin; home dosing resumed on 3/29 ?- needs repeat INR by Thursday or Friday ?  ?COPD/emphysema ?-- no wheezing ?-- chest x-ray negative ?- no hypoxia; 94% on RA ?   ?History of  CVA ?-- continue statin ?  ?Chronic diastolic CHF (congestive heart failure) ([place]):  ?2D echo on 08/31/2020 showed EF of 60 to 65%.  Patient has trace leg edema, but no JVD.  Chest x-ray no pulm edema.  CHF seem to be compensated ?--continue torsemide--creat stable ?  ? ? ?  ? ?Consultants: Orthopedic surgery ?Procedures performed: Left hip hemiarthroplasty, 07/30/21 ?Disposition: Skilled nursing facility ?Diet recommendation:  ?Regular diet ?DISCHARGE MEDICATION: ?Allergies as of 08/01/2021   ? ?   Reactions  ? Amoxicillin Swelling  ? Oral swelling  ? Ampicillin Swelling  ? Oral swelling  ? Ketamine Hcl Anxiety  ? ?  ? ?  ?Medication List  ?  ? ?TAKE these medications   ? ?acetaminophen 500 MG tablet ?Commonly known as: TYLENOL ?Take 2 tablets (1,000 mg total) by mouth every 6 (six) hours as needed. ?What changed:  ?when to take this ?reasons to take this ?  ?buPROPion 300 MG 24 hr tablet ?Commonly known as: WELLBUTRIN XL ?Take 300 mg by mouth daily. ?  ?cetirizine 10 MG tablet ?Commonly known as: ZYRTEC ?Take 10 mg by mouth daily. ?  ?Cholecalciferol 125 MCG (5000 UT) capsule ?Take 5,000 Units by mouth daily. ?  ?famotidine 10 MG tablet ?Commonly known as: PEPCID ?Take 1 tablet (10 mg total) by mouth at bedtime. ?  ?ferrous sulfate 325 (65 FE) MG tablet ?Take 1 tablet (325 mg total) by mouth daily with breakfast. ?Start taking on: August 02, 2021 ?  ?fluticasone 50 MCG/ACT nasal spray ?Commonly known as: FLONASE ?Place 1 spray into both nostrils daily. ?  ?  metoprolol tartrate 25 MG tablet ?Commonly known as: LOPRESSOR ?Take 0.5 tablets (12.5 mg total) by mouth 2 (two) times daily. ?  ?midodrine 5 MG tablet ?Commonly known as: PROAMATINE ?Take 5 mg by mouth 3 (three) times daily. ?  ?MULTIPLE VITAMIN PO ?Take 1 tablet by mouth daily. ?  ?omeprazole 20 MG capsule ?Commonly known as: PRILOSEC ?Take 20 mg by mouth daily. ?  ?oxyCODONE 5 MG immediate release tablet ?Commonly known as: Roxicodone ?Take 1 tablet (5 mg total)  by mouth every 6 (six) hours as needed. ?  ?polyethylene glycol 17 g packet ?Commonly known as: MIRALAX / GLYCOLAX ?Take 34 g by mouth daily as needed. ?  ?potassium citrate 10 MEQ (1080 MG) SR tablet ?Commonly known as: UROCIT-K ?Take 10 mEq by mouth 2 (two) times daily. ?  ?ProAir HFA 108 (90 Base) MCG/ACT inhaler ?Generic drug: albuterol ?Inhale 2 puffs into the lungs every 4 (four) hours as needed. ?  ?selenium sulfide 1 % Lotn ?Commonly known as: SELSUN ?Apply 1 application. topically as directed. Every Monday and Friday for dry scalp. ?  ?senna-docusate 8.6-50 MG tablet ?Commonly known as: Senokot-S ?Take 1 tablet by mouth daily in the afternoon. ?  ?simvastatin 20 MG tablet ?Commonly known as: ZOCOR ?Take 20 mg by mouth at bedtime. ?  ?sodium chloride 0.65 % Soln nasal spray ?Commonly known as: OCEAN ?Place 2 sprays into both nostrils as needed for congestion. ?  ?tamsulosin 0.4 MG Caps capsule ?Commonly known as: FLOMAX ?Take 1 capsule (0.4 mg total) by mouth daily. ?  ?torsemide 20 MG tablet ?Commonly known as: DEMADEX ?Take 20 mg by mouth daily. ?  ?vitamin B-12 250 MCG tablet ?Commonly known as: CYANOCOBALAMIN ?Take 250 mcg by mouth daily. ?  ?warfarin 2.5 MG tablet ?Commonly known as: COUMADIN ?Take 2.5 mg by mouth daily. ?  ?zolpidem 5 MG tablet ?Commonly known as: AMBIEN ?Take 0.5 tablets (2.5 mg total) by mouth at bedtime as needed for sleep. ?  ? ?  ? ?  ?  ? ? ?  ?Discharge Care Instructions  ?(From admission, onward)  ?  ? ? ?  ? ?  Start     Ordered  ? 08/01/21 0000  Discharge wound care:       ?Comments: Continue surgical dressing until follow up  ? 08/01/21 1232  ? ?  ?  ? ?  ? ? Contact information for follow-up providers   ? ? Earnestine Leys, MD. Schedule an appointment as soon as possible for a visit in 2 week(s).   ?Specialty: Orthopedic Surgery ?Contact information: ?[address] ?[place] [address] ?[phone] ? ? ?  ?  ? ?  ?  ? ? Contact information for after-discharge care    ? ? Destination   ? ? [facility] .   ?Service: Skilled Nursing ?Contact information: ? [facility] ?[place] ?[phone] ? ?  ?  ? ?  ?  ? ?  ?  ? ?  ? ?Discharge Exam: ?Filed Weights  ? 07/29/21 1522  ?Weight: 77.1 kg  ? ?Physical Exam ?Constitutional:   ?   Appearance: Normal appearance.  ?HENT:  ?   Head: Normocephalic and atraumatic.  ?   Mouth/Throat:  ?   Mouth: Mucous membranes are moist.  ?Eyes:  ?   Extraocular Movements: Extraocular movements intact.  ?Cardiovascular:  ?   Rate and Rhythm: Normal rate and regular rhythm.  ?Pulmonary:  ?   Effort:  Pulmonary effort is normal. No respiratory distress.  ?   Breath sounds: No wheezing.  ?Abdominal:  ?   General: Bowel sounds are normal. There is no distension.  ?   Palpations: Abdomen is soft.  ?   Tenderness: There is no abdominal tenderness.  ?Musculoskeletal:  ?   Cervical back: Normal range of motion and neck supple.  ?   Comments: Mild expected left thigh swelling, compartments soft.  Surgical dressing in place.  ?Skin: ?   General: Skin is warm and dry.  ?Neurological:  ?   General: No focal deficit present.  ?   Mental Status: She is alert.  ?Psychiatric:     ?   Mood and Affect: Mood normal.     ?   Behavior: Behavior normal.  ? ? ? ?Condition at discharge: stable ? ?The results of significant diagnostics from this hospitalization (including imaging, microbiology, ancillary and laboratory) are listed below for reference.  ? ?Imaging Studies: ?DG Chest 1 View ? ?Result Date: 07/29/2021 ?CLINICAL DATA:  [place] on the left side EXAM: CHEST  1 VIEW COMPARISON:  07/23/2021 FINDINGS: Single frontal view of the chest demonstrates a stable cardiac silhouette. No airspace disease, effusion, or pneumothorax. No acute bony abnormality. IMPRESSION: 1. No acute intrathoracic process. Electronically Signed   By: Randa Ngo M.D.   On: 07/29/2021 16:30  ? ?DG Pelvis 1-2 Views ? ?Result Date: 07/29/2021 ?CLINICAL  DATA:  Fell, pain EXAM: PELVIS - 1-2 VIEW COMPARISON:  None. FINDINGS: Single frontal view of the pelvis demonstrates a subcapital left femoral neck fracture with impaction and varus angulation at the

## 2021-08-01 NOTE — Progress Notes (Signed)
Nutrition Follow-up ? ?DOCUMENTATION CODES:  ? ?Not applicable ? ?INTERVENTION:  ?- Encourage adequate PO intake ?- Continue Ensure Enlive po BID, each supplement provides 350 kcal and 20 grams of protein. ?- MVI with minerals daily ?- Request updated weight ? ?NUTRITION DIAGNOSIS:  ? ?Increased nutrient needs related to hip fracture, post-op healing as evidenced by estimated needs. ? ?Ongoing ? ?GOAL:  ? ?Patient will meet greater than or equal to 90% of their needs ? ?Progressing--addressing via regular diet and nutrition supplement ? ?MONITOR:  ? ?PO intake, Supplement acceptance, Labs, Weight trends ? ?REASON FOR ASSESSMENT:  ? ?Consult ?Assessment of nutrition requirement/status, Hip fracture protocol ? ?ASSESSMENT:  ? ?Pt admitted 3/25 from ALF after losing her balance and falling causing L hip fracture. PMH significant for CHF, afib on coumadin, HTL, COPD, stroke, depression, CKD IIIa, breast CA, kidney stone, and  chronic hyponatremia. ? ?Spoke with pt at bedside. She reports having good PO intake and enjoying her meals. Documented meal completions of 75-100% of 3 recorded meals. She states that she has a hiatal hernia which causes early satiety but overall has a good appetite. RN brought pt Ensure Max during visit. Pt states this is the first she has had but likes them. Recommend she continue to sip them rather than drink them fast to allow for better tolerance.  ? ?Reviewed weights. No updated weight since 3/25. ? ?Mild pitting BLE edema documented ? ?Medications: Vitamin D3, colace, doxycycline, pepcid, ferrous sulfate, MVI, protonix, torsemide, Vitamin B12, coumadin ? ?Labs: sodium 129, calcium 8.1 ? ?UOP: -1.6L x24 hours ?I/O's: -533m since admission ? ?NUTRITION - FOCUSED PHYSICAL EXAM: ? ?Flowsheet Row Most Recent Value  ?Orbital Region No depletion  ?Upper Arm Region No depletion  ?Thoracic and Lumbar Region No depletion  ?Buccal Region No depletion  ?Temple Region Mild depletion  ?Clavicle Bone  Region No depletion  ?Clavicle and Acromion Bone Region No depletion  ?Scapular Bone Region No depletion  ?Dorsal Hand Mild depletion  ?Patellar Region Mild depletion  ?Anterior Thigh Region Mild depletion  ?Posterior Calf Region Mild depletion  ?Edema (RD Assessment) Mild  [BLE]  ?Hair Reviewed  ?Eyes Reviewed  ?Mouth Reviewed  ?Skin Reviewed  ?Nails Reviewed  ? ?  ? ? ?Diet Order:   ?Diet Order   ? ?       ?  Diet general       ?  ?  Diet regular Room service appropriate? Yes; Fluid consistency: Thin  Diet effective now       ?  ? ?  ?  ? ?  ? ? ?EDUCATION NEEDS:  ? ?No education needs have been identified at this time ? ?Skin:  Skin Assessment: Skin Integrity Issues: ?Skin Integrity Issues:: Incisions ?Incisions: L hip (closed) ? ?Last BM:  3/27 ? ?Height:  ? ?Ht Readings from Last 1 Encounters:  ?07/29/21 '5\' 8"'$  (1.727 m)  ? ? ?Weight:  ? ?Wt Readings from Last 1 Encounters:  ?07/29/21 77.1 kg  ? ?BMI:  Body mass index is 25.85 kg/m?. ? ?Estimated Nutritional Needs:  ? ?Kcal:  1900-2100 ? ?Protein:  95-110g ? ?Fluid:  >/=1.9L ? ?AClayborne Dana RDN, LDN ?Clinical Nutrition ?

## 2021-08-18 ENCOUNTER — Non-Acute Institutional Stay: Payer: Medicare Other | Admitting: Student

## 2021-08-18 DIAGNOSIS — R531 Weakness: Secondary | ICD-10-CM

## 2021-08-18 DIAGNOSIS — Z515 Encounter for palliative care: Secondary | ICD-10-CM

## 2021-08-18 DIAGNOSIS — R52 Pain, unspecified: Secondary | ICD-10-CM

## 2021-08-18 DIAGNOSIS — I5032 Chronic diastolic (congestive) heart failure: Secondary | ICD-10-CM

## 2021-08-18 NOTE — Progress Notes (Signed)
? ? ?Manufacturing engineer ?Community Palliative Care Consult Note ?Telephone: [phone]  ?Fax: [phone]  ? ?Date of encounter: 08/18/21 ? ?PATIENT NAME: [name] ?[address] ?[place] [address]   ?[phone] (home)  ?DOB: 1945/05/13 ?MRN: [id number] ?PRIMARY CARE PROVIDER:    ?[facility],  ?[address] ?[place] [address] ?[phone] ? ?REFERRING PROVIDER:   ?Vilinda Boehringer, NP ? ?RESPONSIBLE PARTY:    ?Contact Information   ? ? Name Relation Home Work Mobile  ? Ihor Austin Niece [phone]  [phone]  ? Oswaldo Done Niece [phone]  [phone]  ? ?  ? ? ? ?I met face to face with patient in the facility. Palliative Care was asked to follow this patient by consultation request of  facility to address advance care planning and complex medical decision making. This is the initial visit.  ? ? ?                                 ASSESSMENT AND PLAN / RECOMMENDATIONS:  ? ?Advance Care Planning/Goals of Care: Goals include to maximize quality of life and symptom management. Patient/health care surrogate gave his/her permission to discuss.Our advance care planning conversation included a discussion about:    ?The value and importance of advance care planning  ?Experiences with loved ones who have been seriously ill or have died  ?Exploration of personal, cultural or spiritual beliefs that might influence medical decisions  ?Exploration of goals of care in the event of a sudden injury or illness  ?CODE STATUS: Full Code ? ?Education provided on Palliative Medicine. Will continue to provide symptom management, ongoing support. Goal is to complete therapy, with hopes to transition back to ALF. Patient is open to hospitalization as needed.  ? ?Symptom Management/Plan: ? ?Pain-s/p left hip fracture with repair. patient states her pain is improving, currently management with current regimen. Continue acetaminophen PRN, oxycodone PRN. Continue miralax , senna-S  to help prevent constipation.  ? ?Generalized weakness-secondary to left hip fracture. Continue therapy as directed. Use walker for ambulation; monitor for falls/safety.  ? ?Chronic diastolic HF-EF 28-00%. Patient appears euvolemic. Monitor for symptoms; continue torsemide, metoprolol as directed.  ? ? ?Follow up Palliative Care Visit: Palliative care will continue to follow for complex medical decision making, advance care planning, and clarification of goals. Return in 4 weeks or prn. ? ? ?This visit was coded based on medical decision making (MDM). ? ?PPS: 40% ? ?HOSPICE ELIGIBILITY/DIAGNOSIS: TBD ? ?Chief Complaint: Palliative Initial consult.  ? ?HISTORY OF PRESENT ILLNESS:  Jessica Long is a 76 y.o. year old female  with chronic diastolic CHF, COPD, atrial fibrillation, chronic hyponatremia, hx of CVA, CKD 3a, hx of breast cancer. Patient recently hospitalized 3/25-3/28/23 due to closed left hip fracture due to mechanical fall with repair. ? ?Patient currently at [facility]. She states she is doing better; receiving therapy; using walker. Her pain has been managed well. She denies shortness, LE edema, nausea, constipation. Endorses a good appetite. Patient is having low blood pressures, midodrine added. A 10-point ROS is negative, except for the pertinent negative and positives detailed per the HPI. ? ?History obtained from review of EMR, discussion with primary team, and interview with family, facility staff/caregiver and/or [name].  ?I reviewed available labs, medications, imaging, studies and related documents from the EMR.  Records reviewed and summarized above.  ? ? ?Physical Exam: ?Constitutional: NAD ?  General: frail appearing ?EYES: anicteric sclera, lids intact, no discharge  ?ENMT: intact hearing, oral mucous membranes moist, dentition intact ?CV: S1S2, RRR, no LE edema ?Pulmonary: LCTA, no increased work of breathing, no cough, room air ?Abdomen: normo-active BS + 4 quadrants, soft  and non tender, no ascites ?GU: deferred ?MSK: moves all extremities, ambulatory with walker ?Skin: warm and dry, no rashes or wounds on visible skin ?Neuro: +generalized weakness,  A & O x 3 ?Psych: non-anxious affect, pleasant ?Hem/lymph/immuno: no widespread bruising ?CURRENT PROBLEM LIST:  ?Patient Active Problem List  ? Diagnosis Date Noted  ? Acute pain of left shoulder   ? Closed left hip fracture ([place]) 07/29/2021  ? Fall 07/29/2021  ? Acute renal failure superimposed on stage 3a chronic kidney disease ([place]) 07/29/2021  ? Chronic diastolic CHF (congestive heart failure) ([place]) 07/29/2021  ? Intractable vomiting 07/23/2021  ? Stage 3a chronic kidney disease ([place]) 07/22/2021  ? Current use of long term anticoagulation   ? History of CVA (cerebrovascular accident)   ? History of lithotripsy   ? Chronic hyponatremia   ? Nausea vomiting and diarrhea   ? 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 Hydronephrosis 09/03/2020  ? AKI (acute kidney injury) ([place]) 08/31/2020  ? COPD (chronic obstructive pulmonary disease) with emphysema ([place]) 08/31/2020  ? Shortness of breath 08/31/2020  ? Atrial fibrillation, chronic ([place])   ? Depression   ? ?PAST MEDICAL HISTORY:  ?Active Ambulatory Problems  ?  Diagnosis Date Noted  ? AKI (acute kidney injury) ([place]) 08/31/2020  ? Atrial fibrillation, chronic ([place])   ? Depression   ? COPD (chronic obstructive pulmonary disease) with emphysema ([place]) 08/31/2020  ? Shortness of breath 08/31/2020  ? Hydronephrosis 09/03/2020  ? 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 Stage 3a chronic kidney disease ([place]) 07/22/2021  ? Current use of long term anticoagulation   ? History of CVA (cerebrovascular accident)   ? History of lithotripsy   ? Chronic hyponatremia   ? Nausea vomiting and diarrhea   ? Intractable vomiting 07/23/2021  ? Closed left hip fracture ([place]) 07/29/2021  ? Fall 07/29/2021  ? Acute renal failure superimposed on stage 3a chronic kidney disease ([place]) 07/29/2021  ? Chronic diastolic CHF  (congestive heart failure) ([place]) 07/29/2021  ? Acute pain of left shoulder   ? ?Resolved Ambulatory Problems  ?  Diagnosis Date Noted  ? No Resolved Ambulatory Problems  ? ?Past Medical History:  ?Diagnosis Date  ? Allergic rhinitis   ? Aortic atherosclerosis ([place])   ? Atrial fibrillation ([place])   ? Breast cancer, right ([place])   ? CHF (congestive heart failure) ([place])   ? CKD (chronic kidney disease)   ? History of kidney stones   ? Hyperlipemia   ? Osteoarthritis   ? Stroke [facility]) 2011  ? ?SOCIAL HX:  ?Social History  ? ?Tobacco Use  ? Smoking status: Former  ?  Types: Cigarettes  ?  Quit date: 2011  ?  Years since quitting: 12.2  ? Smokeless tobacco: Never  ?Substance Use Topics  ? Alcohol use: Never  ? ?FAMILY HX:  ?Family History  ?Problem Relation Age of Onset  ? Heart disease Mother   ? Heart attack Father   ? Parkinson's disease Sister   ? Heart disease Sister   ? Heart attack Brother   ? COPD Sister   ?   ? ?ALLERGIES:  ?Allergies  ?Allergen Reactions  ? Amoxicillin Swelling  ?  Oral swelling  ? Ampicillin Swelling  ?  Oral  swelling  ? Ketamine Hcl Anxiety  ?   ?PERTINENT MEDICATIONS:  ?Outpatient Encounter Medications as of 08/18/2021  ?Medication Sig  ? acetaminophen (TYLENOL) 500 MG tablet Take 2 tablets (1,000 mg total) by mouth every 6 (six) hours as needed.  ? buPROPion (WELLBUTRIN XL) 300 MG 24 hr tablet Take 300 mg by mouth daily.  ? cetirizine (ZYRTEC) 10 MG tablet Take 10 mg by mouth daily.  ? Cholecalciferol 125 MCG (5000 UT) capsule Take 5,000 Units by mouth daily.  ? famotidine (PEPCID) 10 MG tablet Take 1 tablet (10 mg total) by mouth at bedtime.  ? ferrous sulfate 325 (65 FE) MG tablet Take 1 tablet (325 mg total) by mouth daily with breakfast.  ? fluticasone (FLONASE) 50 MCG/ACT nasal spray Place 1 spray into both nostrils daily.  ? metoprolol tartrate (LOPRESSOR) 25 MG tablet Take 0.5 tablets (12.5 mg total) by mouth 2 (two) times daily.  ? midodrine (PROAMATINE) 5 MG tablet Take 5 mg by mouth 3  (three) times daily.  ? MULTIPLE VITAMIN PO Take 1 tablet by mouth daily.  ? omeprazole (PRILOSEC) 20 MG capsule Take 20 mg by mouth daily.  ? oxyCODONE (ROXICODONE) 5 MG immediate release tablet Take 1

## 2021-09-20 ENCOUNTER — Non-Acute Institutional Stay: Payer: Medicare Other | Admitting: Student

## 2021-09-20 DIAGNOSIS — R531 Weakness: Secondary | ICD-10-CM

## 2021-09-20 DIAGNOSIS — Z515 Encounter for palliative care: Secondary | ICD-10-CM

## 2021-09-20 DIAGNOSIS — R52 Pain, unspecified: Secondary | ICD-10-CM

## 2021-09-20 DIAGNOSIS — R600 Localized edema: Secondary | ICD-10-CM

## 2021-09-20 NOTE — Progress Notes (Signed)
? ? ?Manufacturing engineer ?Community Palliative Care Consult Note ?Telephone: [phone]  ?Fax: [phone]  ? ? ?Date of encounter: 09/20/21 ?10:05 AM ?PATIENT NAME: Jessica Long ?[address] ?[place] [address]   ?[phone] (home)  ?DOB: 1946-02-20 ?MRN: [id number] ?PRIMARY CARE PROVIDER:    ?[facility],  ?[address] ?[place] [address] ?[phone] ? ?REFERRING PROVIDER:   ?[facility] ?[address] ?[place],  [place] [address] ?[phone] ? ?RESPONSIBLE PARTY:    ?Contact Information   ? ? Name Relation Home Work Mobile  ? Jessica Long Niece [phone]  [phone]  ? Jessica Long Niece [phone]  [phone]  ? ?  ? ? ? ?I met face to face with patient in the facility. Palliative Care was asked to follow this patient by consultation request of  [facility] to address advance care planning and complex medical decision making. This is a follow up visit. ? ?                                 ASSESSMENT AND PLAN / RECOMMENDATIONS:  ? ?Advance Care Planning/Goals of Care: Goals include to maximize quality of life and symptom management. Patient/health care surrogate gave his/her permission to discuss. ?Our advance care planning conversation included a discussion about:    ?The value and importance of advance care planning  ?Experiences with loved ones who have been seriously ill or have died  ?Exploration of personal, cultural or spiritual beliefs that might influence medical decisions  ?Exploration of goals of care in the event of a sudden injury or illness  ?CODE STATUS: Full Code ? ?Symptom Management/Plan: ? ?Pain-continue acetaminophen routinely and PRN, recommend tramadol 50 mg every 8 hours PRN. ? ?LE edema-secondary to HF. Continue torsemide as directed, elevate legs. Encourage patient to start wearing compression hose again daily.  ? ?Generalized weakness-secondary to left hip fracture. Continue PT/OT as  directed. Use walker for ambulation. Monitor for falls/safety.  ?Follow up Palliative Care Visit: Palliative care will continue to follow for complex medical decision making, advance care planning, and clarification of goals. Return in 6-8 weeks or prn. ? ? ?This visit was coded based on medical decision making (MDM). ? ?PPS: 50% ? ?HOSPICE ELIGIBILITY/DIAGNOSIS: TBD ? ?Chief Complaint: Palliative Medicine follow up visit.  ? ?HISTORY OF PRESENT ILLNESS:  Jessica Long is a 76 y.o. year old female  with chronic diastolic CHF, COPD, atrial fibrillation, chronic hyponatremia, hx of CVA, CKD 3a, hx of breast cancer.   ? ?Patient [facility], she is still having pain to left hip and leg. Receiving tylenol 1000 mg BID. She is receiving occupational and physical therapy. Reports worsening edema to LE; taking torsemide daily. Endorses occasional shortness of breath with exertion. She is ambulating with walker; no new falls. She endorses  good appetite. A 10-point ROS is negative, except for the pertinent positives and negatives detailed per the HPI.  ? ?History obtained from review of EMR, discussion with primary team, and interview with family, facility staff/caregiver and/or Jessica Long.  ?I reviewed available labs, medications, imaging, studies and related documents from the EMR.  Records reviewed and summarized above.  ? ?Physical Exam: ?Pulse 68, resp 16, b/p 110/56, sats 96% on room air ?Constitutional: NAD ?General: frail appearing  ?EYES: anicteric sclera, lids intact, no discharge  ?ENMT: intact hearing, oral mucous membranes moist ?CV: S1S2, Irregular  rate, 2+ pedal edema ?Pulmonary: LCTA, no increased work of breathing, no cough, room air ?Abdomen: normo-active BS + 4 quadrants, soft and non tender ?GU: deferred ?MSK: moves all extremities, ambulatory with walker ?Skin: warm and dry, no rashes or wounds on visible skin ?Neuro:  + generalized weakness, A & O x 3 ?Psych: non-anxious affect,  pleasant ?Hem/lymph/immuno: no widespread bruising ? ? ?Thank you for the opportunity to participate in the care of Jessica Long.  The palliative care team will continue to follow. Please call our office at [phone] if we can be of additional assistance.  ? ?Jessica Slocumb, NP  ? ?COVID-19 PATIENT SCREENING TOOL ?Asked and negative response unless otherwise noted:  ? ?Have you had symptoms of covid, tested positive or been in contact with someone with symptoms/positive test in the past 5-10 days? No ? ?

## 2021-12-15 ENCOUNTER — Non-Acute Institutional Stay: Payer: Medicare Other | Admitting: Student

## 2021-12-15 DIAGNOSIS — R52 Pain, unspecified: Secondary | ICD-10-CM

## 2021-12-15 DIAGNOSIS — Z515 Encounter for palliative care: Secondary | ICD-10-CM

## 2021-12-15 DIAGNOSIS — I5032 Chronic diastolic (congestive) heart failure: Secondary | ICD-10-CM

## 2021-12-15 DIAGNOSIS — R600 Localized edema: Secondary | ICD-10-CM

## 2021-12-15 NOTE — Progress Notes (Signed)
[facility] Palliative Care Consult Note Telephone: [phone]  Fax: [phone]    Date of encounter: 12/15/21 10:49 AM PATIENT NAME: Jessica Long [address] [place] [address]   [phone] (home)  DOB: 05-13-1945 MRN: [id number] PRIMARY CARE PROVIDER:    [facility],  [place] [address] [phone]  REFERRING PROVIDER:   [facility] [address] [place],  [place] [address] [phone]  RESPONSIBLE PARTY:    Contact Information     Name Relation Home Work [place], June Niece [phone]  [phone]   Oswaldo Done Niece [phone]  [phone]        I met face to face with patient in the facility. Palliative Care was asked to follow this patient by consultation request of  [facility] to address advance care planning and complex medical decision making. This is a follow up visit.                                   ASSESSMENT AND PLAN / RECOMMENDATIONS:   Advance Care Planning/Goals of Care: Goals include to maximize quality of life and symptom management. Patient/health care surrogate gave his/her permission to discuss.  CODE STATUS: Full Code  Education provided on Palliative Medicine. Will continue to provide supportive care, symptom management as needed.   Symptom Management/Plan:  Chronic Diastolic Heart Failure- patient endorses shortness of breath with exertion and LE edema- Continue torsemide, metoprolol as directed. Continue to wear compression stockings daily, elevate legs when in bed or recliner. Recommend facility weigh patient once weekly; currently once a month.   Pain- c/o pain to bilateral knees, shoulders, hands. Continue acetaminophen 1000 mg BID, tramadol 50 mg every 8 hours PRN, lidocaine roll on to affected areas PRN. Patient to receive knee injections every 3 months; considering gel injections every 6 months.    Follow up Palliative Care Visit: Palliative care will continue to follow for complex medical decision making, advance care planning, and clarification of goals. Return in 8 weeks or prn.   This visit was coded based on medical decision making (MDM).  PPS: 50%  HOSPICE ELIGIBILITY/DIAGNOSIS: TBD  Chief Complaint: Palliative Medicine follow up visit.   HISTORY OF PRESENT ILLNESS:  Jessica Long is a 76 y.o. year old female  with  chronic diastolic CHF, COPD, atrial fibrillation, chronic hyponatremia, hx of CVA, CKD 3a, hx of breast cancer.    Patient resides at [facility]. Endorses pain to bilateral knees, hands and shoulders. Endorses shortness of breath with exertion, LE edema. She is taking torsemide 40 mg BID; weighs occasionally. She has completed therapy. Using walker for ambulation. No falls.  Appetite is good. She is sleeping well at night.   History obtained from review of EMR, discussion with primary team, and interview with family, facility staff/caregiver and/or Ms. Jessica Long.  I reviewed available labs, medications, imaging, studies and related documents from the EMR.  Records reviewed and summarized above.   ROS  10-point ROS is negative, except for the pertinent positives and negatives detailed per the HPI.   Physical Exam: Weight: 174.6 pounds 12/10/21 Pulse 54, resp 16, b/p 122/78, sats 95-96% on room air. Constitutional: NAD General: frail appearing EYES: anicteric sclera, lids intact, no discharge  ENMT: intact hearing, oral mucous membranes moist, dentition intact CV: S1S2, RRR, 2+ LE edema Pulmonary: LCTA, no increased  work of breathing, no cough, room air Abdomen: normo-active BS + 4 quadrants, soft and non tender, no ascites GU: deferred MSK:  moves all extremities, ambulatory Skin: warm and dry, no rashes or wounds on visible skin Neuro:  no generalized weakness,  no cognitive impairment Psych: non-anxious affect, A and O x 3,  pleasant Hem/lymph/immuno: no widespread bruising   Thank you for the opportunity to participate in the care of Ms. Jessica Long.  The palliative care team will continue to follow. Please call our office at [phone] if we can be of additional assistance.   Ezekiel Slocumb, NP   COVID-19 PATIENT SCREENING TOOL Asked and negative response unless otherwise noted:   Have you had symptoms of covid, tested positive or been in contact with someone with symptoms/positive test in the past 5-10 days? No

## 2022-02-14 ENCOUNTER — Non-Acute Institutional Stay: Payer: Medicare Other | Admitting: Student

## 2022-02-14 DIAGNOSIS — I5032 Chronic diastolic (congestive) heart failure: Secondary | ICD-10-CM

## 2022-02-14 DIAGNOSIS — J069 Acute upper respiratory infection, unspecified: Secondary | ICD-10-CM

## 2022-02-14 DIAGNOSIS — Z515 Encounter for palliative care: Secondary | ICD-10-CM

## 2022-02-14 DIAGNOSIS — R52 Pain, unspecified: Secondary | ICD-10-CM

## 2022-02-14 DIAGNOSIS — R051 Acute cough: Secondary | ICD-10-CM

## 2022-02-15 NOTE — Progress Notes (Signed)
[place] Consult Note Telephone: [phone]  Fax: [phone]    Date of encounter: 02/14/22  PATIENT NAME: Jessica Long [address] [place] [address]   [phone] (home)  DOB: 1945-05-30 MRN: [id number] PRIMARY CARE PROVIDER:    [facility],  [place] [address] [phone]  REFERRING PROVIDER:   [facility] [address] [place],  [place] [address] [phone]  RESPONSIBLE PARTY:    Contact Information     Name Relation Home Work [place], June Niece [phone]  [phone]   Oswaldo Done Niece [phone]  [phone]        I met face to face with patient in the facility. Palliative Care was asked to follow this patient by consultation request of  [facility] to address advance care planning and complex medical decision making. This is a follow up visit.                                   ASSESSMENT AND PLAN / RECOMMENDATIONS:   Advance Care Planning/Goals of Care: Goals include to maximize quality of life and symptom management. Patient/health care surrogate gave his/her permission to discuss. Our advance care planning conversation included a discussion about:    The value and importance of advance care planning  Experiences with loved ones who have been seriously ill or have died  Exploration of personal, cultural or spiritual beliefs that might influence medical decisions  Exploration of goals of care in the event of a sudden injury or illness  CODE STATUS: Full Code  Palliative Medicine will continue to provide supportive care, symptom management as needed.  Symptom Management/Plan:  Chronic diastolic HF-EF 62-03%. Patient appears euvolemic. Continue to monitor for symptoms; continue torsemide, metoprolol as directed.   URI, cough-patient reports having cold recently; she tested negative for Covid-19. She  still has lingering cough. PRN order for guaifenesin has expired. Continue guaifenesin 100 mg/65m 10 ml every 6 hours PRN cough/congestion.  Pain-patient with OA. Continue acetaminophen routinely and PRN, tramadol every 8 hours PRN. Apply ice to bilateral knees for up to 20 minutes BID.   Follow up Palliative Care Visit: Palliative care will continue to follow for complex medical decision making, advance care planning, and clarification of goals. Return in 6 -8 weeks or prn.   This visit was coded based on medical decision making (MDM).  PPS: 50%  HOSPICE ELIGIBILITY/DIAGNOSIS: TBD  Chief Complaint: Palliative Medicine follow up visit.   HISTORY OF PRESENT ILLNESS:  Jessica TEATERis a 76y.o. year old female  with chronic diastolic CHF, COPD, atrial fibrillation, chronic hyponatremia, hx of CVA, CKD 3a, hx of breast cancer.     [facility] Patient with OA; she had cortisone injections to bilateral knees on 9/8 and gel injections on 01/26/22.  Swelling has improved to BLE. She is also applying ice BID. She reports having a cold in past 2 weeks. She still reports occasional cough. Denies shortness of breath, sore throat, fever, chills. Good appetite endorsed. She is using walker for ambulation; no falls. No recent ED visits or hospitalizations.   History obtained from review of EMR, discussion with primary team, and interview with family, facility staff/caregiver and/or Ms. NMeda Long  I reviewed available labs, medications, imaging, studies and related documents from the EMR.  Records reviewed and  summarized above.   ROS  A 10-Point ROS is negative, except for the pertinent positives and negatives detailed per the HPI.   Physical Exam: Pulse 68, resp 20, b/p 112/62, sats 95% on room air Constitutional: NAD General: frail appearing EYES: anicteric sclera, lids intact, no discharge  ENMT: intact hearing, oral mucous membranes moist, dentition intact, no sinus pain  or tenderness CV: S1S2, RRR, 1+ LE edema Pulmonary: LCTA, right base slightly diminished, no increased work of breathing, no cough Abdomen: normo-active BS + 4 quadrants, soft and non tender GU: deferred MSK: moves all extremities, ambulatory with walker Skin: warm and dry, no rashes or wounds on visible skin Neuro: + generalized weakness,  no cognitive impairment Psych: non-anxious affect, A and O x 3 Hem/lymph/immuno: no widespread bruising   Thank you for the opportunity to participate in the care of Jessica Long.  The palliative care team will continue to follow. Please call our office at [phone] if we can be of additional assistance.   Ezekiel Slocumb, NP   COVID-19 PATIENT SCREENING TOOL Asked and negative response unless otherwise noted:   Have you had symptoms of covid, tested positive or been in contact with someone with symptoms/positive test in the past 5-10 days? No

## 2022-06-15 ENCOUNTER — Non-Acute Institutional Stay: Payer: Medicare Other | Admitting: Hospice

## 2022-06-15 DIAGNOSIS — R531 Weakness: Secondary | ICD-10-CM

## 2022-06-15 DIAGNOSIS — Z515 Encounter for palliative care: Secondary | ICD-10-CM

## 2022-06-15 DIAGNOSIS — I5032 Chronic diastolic (congestive) heart failure: Secondary | ICD-10-CM

## 2022-06-15 DIAGNOSIS — R52 Pain, unspecified: Secondary | ICD-10-CM

## 2022-06-15 DIAGNOSIS — I4891 Unspecified atrial fibrillation: Secondary | ICD-10-CM

## 2022-06-15 NOTE — Progress Notes (Signed)
[facility] Consult Note Telephone: [phone]  Fax: [phone]    PATIENT NAME: [name] [address]   [phone] (home)  DOB: June 18, 1945 MRN: [id number] PRIMARY CARE PROVIDER:    [facility],  [place] [address] [phone]  REFERRING PROVIDER:   [facility], [facility] [id number] [place] [address] [place],  [place] [address] [id number]  RESPONSIBLE PARTY:    Contact Information     Name Relation Home Work [place], June Niece [phone]  [phone]   Oswaldo Done Niece [id number]  [phone]        I met face to face with patient in the facility. Palliative Care was asked to follow this patient by consultation request of  [facility]* to address advance care planning and complex medical decision making. This is a follow up visit.  Visit consisted of counseling and education dealing with the complex and emotionally intense issues of symptom management and palliative care in the setting of serious and potentially life-threatening illness.  Cooperative and pleasant, patient initiated that her face in God and [place] keep her going and expressed appreciation for the progress she has made since after her stroke.  Therapeutic listening and ample emotional support provided.  Palliative care team will continue to support patient, patient's family, and medical team.                                   ASSESSMENT AND PLAN / RECOMMENDATIONS:   Goals of Care: Goals include to maximize quality of life and symptom management.   CODE STATUS: She affirmed she is a full Code  Symptom Management/Plan:  Chronic diastolic HF-no exacerbation. Continue torsemide as directed.  Continue compression socks in am and take off at bedtime. Weight weekly x 4. Report weight gain of 2 Ibs in a day or 5 Ibs in a week. Current weight is 177.2 Ibs  05/22/22. Follow up with cardiologist as planned.  Afib: Managed with Eliquis and Metoprolol for rate control. .  Weakness: Continue to participate in Be fit program/restorative exercises. Continue to walk the hall twice > 100 feet twice daily as currently doing.  Balance of rest and performance activity.  Fall precautions reiterated. Pain-patient with OA. Patient reports effectively managed with acetaminophen routinely and PRN, tramadol every 8 hours PRN. Continue to follow up with Ortho for cortisone shots to bilateral knees.   Follow up Palliative Care Visit: Palliative care will continue to follow for complex medical decision making, advance care planning, and clarification of goals. Return in 6 -8 weeks or prn.  PPS: 50%  HOSPICE ELIGIBILITY/DIAGNOSIS: TBD  Chief Complaint: Palliative Medicine follow up visit.   HISTORY OF PRESENT ILLNESS:  Jessica Long is a 77 y.o. year old female  with chronic diastolic CHF, COPD, atrial fibrillation, chronic hyponatremia, hx of CVA, CKD 3b, hx of breast cancer.  Patient reports overall doing well, reports pain well-managed with current pain regimen; in no acute/respiratory distress.  Rest of 10 point ROS asked and negative. History obtained from review of EMR, discussion with primary team, and interview with family, facility staff/caregiver and/or Ms. Jessica Long.  I reviewed available labs, medications, imaging, studies and related documents from the EMR.  Records reviewed and summarized above.   Physical Exam: Pulse 61, resp 19  b/p 128/80, sats 95% on room air  Constitutional: NAD EYES: anicteric sclera, lids intact, no discharge  ENMT: intact hearing, oral mucous membranes moist, dentition intact, no sinus pain or tenderness CV: S1S2, RRR, nonpitting edema Pulmonary: LCTA, right base slightly diminished, no increased work of breathing, no cough Abdomen: normo-active BS + 4 quadrants, soft and non tender GU: deferred MSK: moves all extremities,  ambulatory with walker Skin: warm and dry, no rashes or wounds on visible skin Neuro: + generalized weakness,  no cognitive impairment Psych: non-anxious affect, A and O x 3 Hem/lymph/immuno: no widespread bruising  I spent 60 minutes providing this consultation; this includes time spent with patient/family, chart review and documentation. More than 50% of the time in this consultation was spent on counseling and coordinating communication.   Thank you for the opportunity to participate in the care of Ms. Jessica Long.  The palliative care team will continue to follow. Please call our office at [phone] if we can be of additional assistance.   Teodoro Spray, NP

## 2022-08-14 ENCOUNTER — Non-Acute Institutional Stay: Payer: Medicare Other | Admitting: Hospice

## 2022-08-14 DIAGNOSIS — I5032 Chronic diastolic (congestive) heart failure: Secondary | ICD-10-CM

## 2022-08-14 DIAGNOSIS — I10 Essential (primary) hypertension: Secondary | ICD-10-CM

## 2022-08-14 DIAGNOSIS — R531 Weakness: Secondary | ICD-10-CM

## 2022-08-14 DIAGNOSIS — R52 Pain, unspecified: Secondary | ICD-10-CM

## 2022-08-14 DIAGNOSIS — Z515 Encounter for palliative care: Secondary | ICD-10-CM

## 2022-08-14 DIAGNOSIS — I4891 Unspecified atrial fibrillation: Secondary | ICD-10-CM

## 2022-08-14 NOTE — Progress Notes (Signed)
[facility]rapist, [facility] Palliative [facility] Consult Note Telephone: [phone]  Fax: [phone]    PATIENT NAME: Jessica Long [address] [place] [address]   [phone] (home)  DOB: 1946-03-20 MRN: [id number] PRIMARY CARE PROVIDER:    [facility],  [address] [place] [address] [phone]  REFERRING PROVIDER:   [facility] [address] [place],  [place] [address] [phone]  RESPONSIBLE PARTY:   Self Contact Information     Name Relation Home Work [place], June Niece [phone]  [phone]   Randalyn Rhea Niece [phone]  [phone]        I met face to face with patient in the facility. Palliative Care was asked to follow this patient by consultation request of  [facility]* to address advance care planning and complex medical decision making. This is a follow up visit.  Visit consisted of counseling and education dealing with the complex and emotionally intense issues of symptom management and palliative care in the setting of serious and potentially life-threatening illness.  Patient continues to enjoy hide Christian faith, and basketball sporting activities on TV. Palliative care team will continue to support patient, patient's family, and medical team.                                   ASSESSMENT AND PLAN / RECOMMENDATIONS:   Goals of Care: Goals include to maximize quality of life and symptom management.   CODE STATUS: She affirmed she is a full Code  Symptom Management/Plan:  Chronic diastolic HF-no exacerbation. Continue torsemide as ordered.  Continue compression socks in am and take off at bedtime. Weight weekly x 4. Report weight gain of 2 Ibs in a day or 5 Ibs in a week. Weight : 182.6 Ibs 4/3/124  07/06/22 183.2 Ibs 177.2 Ibs 05/22/22. Monitor for edema, shortness of breath, chest pain.  Routine CBC BMP.  Follow up with cardiologist as  planned.   Afib: Managed with Eliquis and Metoprolol for rate control.  Hypertension: Managed with Metoprolol. Hold with pulse <50 and systolic <110.  Follow up with Cardiologist in May '24.   Weakness: Improving. Continue to participate in Be fit program/restorative exercises. Continue to walk the hall twice > 100 feet twice daily as currently doing.  Balance of rest and performance activity.  Fall precautions reiterated.  Pain-related to OA. Patient reports effectively managed with acetaminophen  and Tramadol. Continue to follow up with Ortho for cortisone shots to bilateral knees. Next visit scheduled in June.   Follow up Palliative Care Visit: Palliative care will continue to follow for complex medical decision making, advance care planning, and clarification of goals. Return in 6 -8 weeks or prn.  PPS: 50%  HOSPICE ELIGIBILITY/DIAGNOSIS: TBD  Chief Complaint: Palliative Medicine follow up visit.   HISTORY OF PRESENT ILLNESS:  Jessica Long is a 77 y.o. year old female  with chronic diastolic CHF, COPD, atrial fibrillation, chronic hyponatremia, hx of CVA, CKD 3b, hx of breast cancer.  Patient reports overall doing well, no hospitalization since last visitreports pain well-managed with current pain regimen; in no acute/respiratory distress.  Rest of 10 point ROS asked and negative. History obtained from review of EMR, discussion with primary team, and interview with family, facility staff/caregiver and/or Jessica Long.  I reviewed available labs, medications, imaging, studies and related documents from the EMR.  Records reviewed and summarized above.   Physical Exam: Pulse 60, resp 18,  b/p 116/68, sats 95% on room air Constitutional: NAD EYES: anicteric sclera, lids intact, no discharge  ENMT: intact hearing, oral mucous membranes moist, dentition intact, no sinus pain or tenderness CV: S1S2, RRR, nonpitting edema Pulmonary: LCTA, right base slightly diminished, no increased work of  breathing, no cough Abdomen: normo-active BS + 4 quadrants, soft and non tender MSK: moves all extremities, ambulatory with walker Skin: warm and dry, no rashes or wounds on visible skin Neuro: + generalized weakness,  no cognitive impairment Psych: non-anxious affect, A and O x 3 Hem/lymph/immuno: no widespread bruising  I spent 60 minutes providing this consultation; this includes time spent with patient/family, chart review and documentation. More than 50% of the time in this consultation was spent on counseling and coordinating communication.   Thank you for the opportunity to participate in the care of Jessica Long.  The palliative care team will continue to follow. Please call our office at [phone] if we can be of additional assistance.   Rosaura Carpenter, NP

## 2022-10-04 ENCOUNTER — Non-Acute Institutional Stay: Payer: Medicare Other | Admitting: Hospice

## 2022-10-04 DIAGNOSIS — R52 Pain, unspecified: Secondary | ICD-10-CM

## 2022-10-04 DIAGNOSIS — I5032 Chronic diastolic (congestive) heart failure: Secondary | ICD-10-CM

## 2022-10-04 DIAGNOSIS — R531 Weakness: Secondary | ICD-10-CM

## 2022-10-04 DIAGNOSIS — Z515 Encounter for palliative care: Secondary | ICD-10-CM

## 2022-10-04 NOTE — Progress Notes (Signed)
Therapist, nutritional Palliative Care Consult Note Telephone: [phone]  Fax: [phone]    PATIENT NAME: Jessica Long [address] [place] [address]   [phone] (home)  DOB: 1945-12-05 MRN: [id number] PRIMARY CARE PROVIDER:    [facility],  [address] [place] [address] [phone]  Neita Goodnight NP  REFERRING PROVIDER:   [facility] [address] [place],  [place] [address] [phone]  RESPONSIBLE PARTY:   Self Contact Information     Name Relation Home Work [place], June Niece [phone]  [phone]   Randalyn Rhea Niece [phone]  [phone]        I met face to face with patient in the facility. Palliative Care was asked to follow this patient by consultation request of  [facility] to address advance care planning and complex medical decision making. This is a follow up visit.  Visit consisted of counseling and education dealing with the complex and emotionally intense issues of symptom management and palliative care in the setting of serious and potentially life-threatening illness.  Patient continues to enjoy her [employer] faith, and basketball sporting activities on TV. Palliative care team will continue to support patient, patient's family, and medical team.                                   ASSESSMENT AND PLAN / RECOMMENDATIONS:   Goals of Care: Goals include to maximize quality of life and symptom management.   CODE STATUS: Full Code  Symptom Management/Plan:  Chronic diastolic HF-no exacerbation. Continue torsemide as ordered.  Continue compression socks in am and take off at bedtime.  Monitor weight closely per facility protocol and report weight gain of 2 Ibs in a day or 5 Ibs in a week. Weight : 176.2 08/29/22 182.6 Ibs 4/3/124  183.2 Ibs 07/06/22  177.2 Ibs 05/22/22. Monitor for edema, shortness of breath, chest pain.  Routine CBC  BMP.  Follow up with cardiologist as planned.   Afib: Managed with Eliquis and Metoprolol for rate control.   Hypertension: Managed with Metoprolol. Hold with pulse <50 and systolic <110.  Last follow up with Cardiologist May 09/29/22, no changes to plan of care.    Weakness: Improving. PT/OT is ongoing for strengthening and mobility. Continue to participate in Be fit program/restorative /mindfulness exercises. Continue to walk the hall twice > 200 feet twice daily as currently doing.  Balance of rest and performance activity.  Fall precautions reiterated.  Pain-related to OA. Patient reports effectively managed with acetaminophen  and Tramadol. Continue to follow up with Ortho for cortisone shots to bilateral knees. Next visit scheduled in June10 2024.    Follow up Palliative Care Visit: Palliative care will continue to follow for complex medical decision making, advance care planning, and clarification of goals. Return in 6 -8 weeks or prn.  PPS: 50%  HOSPICE ELIGIBILITY/DIAGNOSIS: TBD  Chief Complaint: Palliative Medicine follow up visit.   HISTORY OF PRESENT ILLNESS:  Jessica Long is a 77 y.o. year old female  with chronic diastolic CHF, COPD, atrial fibrillation, chronic hyponatremia, hx of CVA, CKD 3b, hx of breast cancer.  Patient reports overall doing well, no hospitalization since last visitreports pain well-managed with current pain regimen; in no acute/respiratory distress.  Rest of 10 point ROS asked and negative. History obtained from review of EMR, discussion with primary  team, and interview with family, facility staff/caregiver and/or Ms. Delton See.  I reviewed available labs, medications, imaging, studies and related documents from the EMR.  Records reviewed and summarized above.   Physical Exam: Constitutional: NAD EYES: anicteric sclera, lids intact, no discharge  ENMT: intact hearing, oral mucous membranes moist, dentition intact, no sinus pain or tenderness CV: S1S2, RRR,  no edema Pulmonary: LCTA, right base slightly diminished, no increased work of breathing, no cough,  Abdomen: normo-active BS + 4 quadrants, soft and non tender MSK: moves all extremities, ambulatory with walker Skin: warm and dry, no rashes or wounds on visible skin Neuro: + generalized weakness,  no cognitive impairment Psych: non-anxious affect, A and O x 3 Hem/lymph/immuno: no widespread bruising  I spent 60 minutes providing this consultation; this includes time spent with patient/family, chart review and documentation. More than 50% of the time in this consultation was spent on counseling and coordinating communication.   Thank you for the opportunity to participate in the care of Ms. Delton See.  The palliative care team will continue to follow. Please call our office at [phone] if we can be of additional assistance.   Rosaura Carpenter, NP

## 2023-04-26 ENCOUNTER — Ambulatory Visit: Payer: Medicare Other | Admitting: Physician Assistant

## 2023-05-10 ENCOUNTER — Ambulatory Visit: Payer: Medicare Other | Admitting: Physician Assistant

## 2023-05-10 VITALS — BP 112/66 | HR 67

## 2023-05-10 DIAGNOSIS — R300 Dysuria: Secondary | ICD-10-CM

## 2023-05-10 DIAGNOSIS — N200 Calculus of kidney: Secondary | ICD-10-CM | POA: Diagnosis not present

## 2023-05-10 DIAGNOSIS — N1831 Chronic kidney disease, stage 3a: Secondary | ICD-10-CM

## 2023-05-10 LAB — URINALYSIS, COMPLETE
Bilirubin, UA: NEGATIVE
Glucose, UA: NEGATIVE
Ketones, UA: NEGATIVE
Nitrite, UA: POSITIVE — AB
Protein,UA: NEGATIVE
Specific Gravity, UA: 1.015 (ref 1.005–1.030)
Urobilinogen, Ur: 0.2 mg/dL (ref 0.2–1.0)
pH, UA: 7 (ref 5.0–7.5)

## 2023-05-10 LAB — MICROSCOPIC EXAMINATION: WBC, UA: >30 /[HPF] — AB (ref 0–5)

## 2023-05-10 LAB — BLADDER SCAN AMB NON-IMAGING: Scan Result: 72

## 2023-05-10 NOTE — Progress Notes (Signed)
 05/10/2023 3:50 PM   Jessica Long Jul 13, 1945 [id number]  CC: Chief Complaint  Patient presents with   Follow-up   HPI: Jessica Long is a 78 y.o. female with PMH nephrolithiasis s/p ureteroscopy of 2 large left ureteral/renal stones with Dr. Twylla in 2022 who presents today for evaluation of worsening renal function.  She resides at [facility].  She saw Dr. Dennise on 04/17/2023.  Her creatinine was noted to have risen to 1.73 over baseline 1.47.  Unclear etiology of declining renal function, though she was noted to be on torsemide  40 mg twice daily, which was reduced to 40 mg daily.  CT AP with contrast dated 07/23/2021 was notable for 16mm left lower pole partial staghorn stone.  Today she reports no gross hematuria, fever, chills, nausea, vomiting, or flank pain.  This morning she awoke and with some urinary urgency and hesitancy, however she admits that this is rather intermittent for her and tends to resolve on its own.  In-office UA today positive for 1+ blood, nitrites, and 3+ leukocytes; urine microscopy with >30 WBCs/HPF, 3-10 RBCs/HPF, and many bacteria. PVR 72mL.  PMH: Past Medical History:  Diagnosis Date   Allergic rhinitis    Aortic atherosclerosis (HCC)    Atrial fibrillation (HCC)    Breast cancer, right (HCC)    CHF (congestive heart failure) (HCC)    CKD (chronic kidney disease)    Current use of long term anticoagulation    Warfarin   Depression    History of kidney stones    Hyperlipemia    Osteoarthritis    Stroke [facility]) 2011    Surgical History: Past Surgical History:  Procedure Laterality Date   CYSTOSCOPY WITH URETEROSCOPY AND STENT PLACEMENT Left 09/03/2020   Procedure: CYSTOSCOPY  AND LEFT STENT PLACEMENT;  Surgeon: Devere Lonni Righter, MD;  Location: [facility] ORS;  Service: Urology;  Laterality: Left;   CYSTOSCOPY/URETEROSCOPY/HOLMIUM LASER/STENT PLACEMENT Left 10/25/2020   Procedure: CYSTOSCOPY/URETEROSCOPY/HOLMIUM LASER/STENT EXCHANGE;   Surgeon: Twylla Glendia BROCKS, MD;  Location: [facility] ORS;  Service: Urology;  Laterality: Left;   HIP ARTHROPLASTY Left 07/30/2021   Procedure: ARTHROPLASTY BIPOLAR HIP (HEMIARTHROPLASTY);  Surgeon: Cleotilde Barrio, MD;  Location: [facility] ORS;  Service: Orthopedics;  Laterality: Left;   MASTECTOMY Right 2007    Home Medications:  Allergies as of 05/10/2023       Reactions   Amoxicillin Swelling   Oral swelling   Ampicillin Swelling   Oral swelling   Ketamine  Hcl Anxiety        Medication List        Accurate as of May 10, 2023  3:50 PM. If you have any questions, ask your nurse or doctor.          STOP taking these medications    acetaminophen  500 MG tablet Commonly known as: TYLENOL    cetirizine 10 MG tablet Commonly known as: ZYRTEC   Cholecalciferol  125 MCG (5000 UT) capsule   ProAir  HFA 108 (90 Base) MCG/ACT inhaler Generic drug: albuterol    sodium chloride  0.65 % Soln nasal spray Commonly known as: OCEAN   warfarin 2.5 MG tablet Commonly known as: COUMADIN    zolpidem  5 MG tablet Commonly known as: AMBIEN        TAKE these medications    buPROPion  300 MG 24 hr tablet Commonly known as: WELLBUTRIN  XL Take 300 mg by mouth daily.   cyanocobalamin  250 MCG tablet Commonly known as: VITAMIN B12 Take 250 mcg by mouth daily.   Eliquis 5 MG Tabs tablet Generic  drug: apixaban Take 5 mg by mouth 2 (two) times daily.   famotidine  10 MG tablet Commonly known as: PEPCID  Take 1 tablet (10 mg total) by mouth at bedtime.   ferrous sulfate  325 (65 FE) MG tablet Take 1 tablet (325 mg total) by mouth daily with breakfast.   fluticasone  50 MCG/ACT nasal spray Commonly known as: FLONASE  Place 1 spray into both nostrils daily.   levocetirizine 5 MG tablet Commonly known as: XYZAL Take 5 mg by mouth daily.   melatonin 5 MG Tabs Take 5 mg by mouth at bedtime.   metoprolol  tartrate 25 MG tablet Commonly known as: LOPRESSOR  Take 0.5 tablets (12.5 mg total) by  mouth 2 (two) times daily.   midodrine  5 MG tablet Commonly known as: PROAMATINE  Take 5 mg by mouth 3 (three) times daily.   MULTIPLE VITAMIN PO Take 1 tablet by mouth daily.   omeprazole 20 MG capsule Commonly known as: PRILOSEC Take 20 mg by mouth daily.   polyethylene glycol 17 g packet Commonly known as: MIRALAX  / GLYCOLAX  Take 34 g by mouth daily as needed.   potassium citrate 10 MEQ (1080 MG) SR tablet Commonly known as: UROCIT-K Take 10 mEq by mouth 2 (two) times daily.   selenium sulfide 1 % Lotn Commonly known as: SELSUN Apply 1 application. topically as directed. Every Monday and Friday for dry scalp.   senna-docusate 8.6-50 MG tablet Commonly known as: Senokot-S Take 1 tablet by mouth daily in the afternoon.   simvastatin  20 MG tablet Commonly known as: ZOCOR  Take 20 mg by mouth at bedtime.   Spiriva Respimat 1.25 MCG/ACT Aers Generic drug: Tiotropium Bromide Monohydrate 2 Puff(s) Via Inhaler Daily   tamsulosin  0.4 MG Caps capsule Commonly known as: FLOMAX  Take 1 capsule (0.4 mg total) by mouth daily.   torsemide  20 MG tablet Commonly known as: DEMADEX  Take 40 mg by mouth daily.   traMADol  50 MG tablet Commonly known as: ULTRAM  Take 50 mg by mouth 2 (two) times daily as needed.        Allergies:  Allergies  Allergen Reactions   Amoxicillin Swelling    Oral swelling   Ampicillin Swelling    Oral swelling   Ketamine  Hcl Anxiety    Family History: Family History  Problem Relation Age of Onset   Heart disease Mother    Heart attack Father    Parkinson's disease Sister    Heart disease Sister    Heart attack Brother    COPD Sister     Social History:   reports that she quit smoking about 14 years ago. Her smoking use included cigarettes. She has never used smokeless tobacco. She reports that she does not drink alcohol and does not use drugs.  Physical Exam: BP 112/66   Pulse 67   Constitutional:  Alert and oriented, no acute  distress, nontoxic appearing HEENT: [place], AT Cardiovascular: No clubbing, cyanosis, or edema Respiratory: Normal respiratory effort, no increased work of breathing Skin: No rashes, bruises or suspicious lesions Neurologic: Grossly intact, no focal deficits, moving all 4 extremities Psychiatric: Normal mood and affect  Laboratory Data: Results for orders placed or performed in visit on 05/10/23  Microscopic Examination   Collection Time: 05/10/23  3:21 PM   Urine  Result Value Ref Range   WBC, UA >30 (A) 0 - 5 /hpf   RBC, Urine 3-10 (A) 0 - 2 /hpf   Epithelial Cells (non renal) 0-10 0 - 10 /hpf   Mucus, UA Present (  A) Not Estab.   Bacteria, UA Many (A) None seen/Few  Urinalysis, Complete   Collection Time: 05/10/23  3:21 PM  Result Value Ref Range   Specific Gravity, UA 1.015 1.005 - 1.030   pH, UA 7.0 5.0 - 7.5   Color, UA Yellow Yellow   Appearance Ur Cloudy (A) Clear   Leukocytes,UA 3+ (A) Negative   Protein,UA Negative Negative/Trace   Glucose, UA Negative Negative   Ketones, UA Negative Negative   RBC, UA 1+ (A) Negative   Bilirubin, UA Negative Negative   Urobilinogen, Ur 0.2 0.2 - 1.0 mg/dL   Nitrite, UA Positive (A) Negative   Microscopic Examination See below:   Bladder Scan (Post Void Residual) in office   Collection Time: 05/10/23  3:42 PM  Result Value Ref Range   Scan Result 72 ml    Assessment & Plan:   1. Stage 3a chronic kidney disease (HCC) (Primary) Recent decline in renal function.  We discussed that this could be due to diuretics, however given her stone history and recurrent partial staghorn stone on CT scan greater than 1 year ago, I recommended repeating a CT stone study at this time to reassess her stone burden. - Bladder Scan (Post Void Residual) in office - CT RENAL STONE STUDY; Future  2. Left nephrolithiasis Possibly contributory to #1 above.  Will have her see Dr. Twylla in about 3 weeks with CT scan prior.  Notably, no acute stone symptoms  today. - CT RENAL STONE STUDY; Future  3. Dysuria Intermittent dysuria that tends to resolve on its own, most recently starting this morning.  Her UA is suspicious for UTI, however she is highly likely to be colonized given her known staghorn calculus.  Will send for culture and reach out to recheck symptoms before prescribing antibiotics. - Urinalysis, Complete - CULTURE, URINE COMPREHENSIVE   Return for 3 weeks CT prior.  Lucie Hones, PA-C  [facility] [place] [address], [address] [place], [place] [address] [phone]

## 2023-05-15 LAB — CULTURE, URINE COMPREHENSIVE

## 2023-05-22 ENCOUNTER — Ambulatory Visit
Admission: RE | Admit: 2023-05-22 | Discharge: 2023-05-22 | Disposition: A | Discharge status: Home / Self Care | Payer: Medicare Other | Source: Ambulatory Visit | Attending: Physician Assistant | Admitting: Physician Assistant

## 2023-05-22 DIAGNOSIS — N1831 Chronic kidney disease, stage 3a: Secondary | ICD-10-CM | POA: Diagnosis present

## 2023-05-22 DIAGNOSIS — N200 Calculus of kidney: Secondary | ICD-10-CM | POA: Insufficient documentation

## 2023-06-07 ENCOUNTER — Ambulatory Visit: Payer: Medicare Other | Admitting: Urology

## 2023-07-08 ENCOUNTER — Ambulatory Visit: Payer: Medicare Other | Admitting: Urology

## 2023-07-12 ENCOUNTER — Encounter: Payer: Self-pay | Admitting: Urology

## 2023-07-12 ENCOUNTER — Ambulatory Visit: Payer: Medicare Other | Admitting: Urology

## 2023-07-12 VITALS — BP 130/84 | HR 68 | Ht 65.0 in | Wt 170.0 lb

## 2023-07-12 DIAGNOSIS — N200 Calculus of kidney: Secondary | ICD-10-CM | POA: Diagnosis not present

## 2023-07-12 NOTE — Patient Instructions (Signed)

## 2023-07-12 NOTE — Progress Notes (Signed)
 I, Jessica Long, acting as a scribe for Jessica Altes, MD., have documented all relevant documentation on the behalf of Jessica Altes, MD, as directed by Jessica Altes, MD while in the presence of Jessica Altes, MD.  07/12/2023 12:42 PM   Pauline Good Jan 30, 1946 [id number]  Referring provider: [facility] [address] [place],  [place] [address]  Chief Complaint  Patient presents with   Results   Urologic history: 1. Stage 3a chronic kidney disease (HCC)   2. Left nephrolithiasis  3. Dysuria  HPI: NADYNE Long is a 78 Jessica.o. female presents for a follow-up visit.  Refer to Jessica Long's previous note of 05/10/2023 She presents today for review of her CT. CT perform 05/22/2023 shows bilateral renal parenchymal atrophy and scarring. There are bilateral non-obstructing renal calculi measuring 1-2 mm. No hydronephrosis.    PMH: Past Medical History:  Diagnosis Date   Allergic rhinitis    Aortic atherosclerosis (HCC)    Atrial fibrillation (HCC)    Breast cancer, right (HCC)    CHF (congestive heart failure) (HCC)    CKD (chronic kidney disease)    Current use of long term anticoagulation    Warfarin   Depression    History of kidney stones    Hyperlipemia    Osteoarthritis    Stroke [facility]) 2011    Surgical History: Past Surgical History:  Procedure Laterality Date   CYSTOSCOPY WITH URETEROSCOPY AND STENT PLACEMENT Left 09/03/2020   Procedure: CYSTOSCOPY  AND LEFT STENT PLACEMENT;  Surgeon: Rene Paci, MD;  Location: [facility];  Service: Urology;  Laterality: Left;   CYSTOSCOPY/URETEROSCOPY/HOLMIUM LASER/STENT PLACEMENT Left 10/25/2020   Procedure: CYSTOSCOPY/URETEROSCOPY/HOLMIUM LASER/STENT EXCHANGE;  Surgeon: Jessica Altes, MD;  Location: [facility];  Service: Urology;  Laterality: Left;   HIP ARTHROPLASTY Left 07/30/2021   Procedure: ARTHROPLASTY BIPOLAR HIP (HEMIARTHROPLASTY);  Surgeon: Deeann Saint, MD;   Location: [facility];  Service: Orthopedics;  Laterality: Left;   MASTECTOMY Right 2007    Home Medications:  Allergies as of 07/12/2023       Reactions   Amoxicillin Swelling   Oral swelling   Ampicillin Swelling   Oral swelling   Ketamine Hcl Anxiety        Medication List        Accurate as of July 12, 2023 12:42 PM. If you have any questions, ask your nurse or doctor.          amLODipine 5 MG tablet Commonly known as: NORVASC Take 5 mg by mouth daily.   buPROPion 300 MG 24 hr tablet Commonly known as: WELLBUTRIN XL Take 300 mg by mouth daily.   cyanocobalamin 250 MCG tablet Commonly known as: VITAMIN B12 Take 250 mcg by mouth daily.   Eliquis 5 MG Tabs tablet Generic drug: apixaban Take 5 mg by mouth 2 (two) times daily.   famotidine 10 MG tablet Commonly known as: PEPCID Take 1 tablet (10 mg total) by mouth at bedtime.   ferrous sulfate 325 (65 FE) MG tablet Take 1 tablet (325 mg total) by mouth daily with breakfast.   fluticasone 50 MCG/ACT nasal spray Commonly known as: FLONASE Place 1 spray into both nostrils daily.   levocetirizine 5 MG tablet Commonly known as: XYZAL Take 5 mg by mouth daily.   melatonin 5 MG Tabs Take 5 mg by mouth at bedtime.   metoprolol tartrate 25 MG tablet Commonly known as: LOPRESSOR Take 0.5 tablets (12.5  mg total) by mouth 2 (two) times daily.   midodrine 5 MG tablet Commonly known as: PROAMATINE Take 5 mg by mouth 3 (three) times daily.   MULTIPLE VITAMIN PO Take 1 tablet by mouth daily.   omeprazole 20 MG capsule Commonly known as: PRILOSEC Take 20 mg by mouth daily.   polyethylene glycol 17 g packet Commonly known as: MIRALAX / GLYCOLAX Take 34 g by mouth daily as needed.   potassium citrate 10 MEQ (1080 MG) SR tablet Commonly known as: UROCIT-K Take 10 mEq by mouth 2 (two) times daily.   selenium sulfide 1 % Lotn Commonly known as: SELSUN Apply 1 application. topically as directed. Every Monday  and Friday for dry scalp.   senna-docusate 8.6-50 MG tablet Commonly known as: Senokot-S Take 1 tablet by mouth daily in the afternoon.   simvastatin 20 MG tablet Commonly known as: ZOCOR Take 20 mg by mouth at bedtime.   Spiriva Respimat 1.25 MCG/ACT Aers Generic drug: Tiotropium Bromide Monohydrate 2 Puff(s) Via Inhaler Daily   tamsulosin 0.4 MG Caps capsule Commonly known as: FLOMAX Take 1 capsule (0.4 mg total) by mouth daily.   torsemide 20 MG tablet Commonly known as: DEMADEX Take 40 mg by mouth daily.   traMADol 50 MG tablet Commonly known as: ULTRAM Take 50 mg by mouth 2 (two) times daily as needed.   triamcinolone cream 0.1 % Commonly known as: KENALOG Apply topically.        Allergies:  Allergies  Allergen Reactions   Amoxicillin Swelling    Oral swelling   Ampicillin Swelling    Oral swelling   Ketamine Hcl Anxiety    Family History: Family History  Problem Relation Age of Onset   Heart disease Mother    Heart attack Father    Parkinson's disease Sister    Heart disease Sister    Heart attack Brother    COPD Sister     Social History:  reports that she quit smoking about 14 years ago. Her smoking use included cigarettes. She has never used smokeless tobacco. She reports that she does not drink alcohol and does not use drugs.   Physical Exam: BP 130/84   Pulse 68   Ht 5\' 5"  (1.651 m)   Wt 170 lb (77.1 kg)   BMI 28.29 kg/m   Constitutional:  Alert and oriented, No acute distress. HEENT: [place] AT Respiratory: Normal respiratory effort, no increased work of breathing. Psychiatric: Normal mood and affect.   Pertinent Imaging: CT was personally reviewed and interpreted.  CT RENAL STONE STUDY  Narrative CLINICAL DATA:  Symptomatic urolithiasis. History of stage 3 kidney disease. Decreasing kidney function lately with elevated creatinine.  EXAM: CT ABDOMEN AND PELVIS WITHOUT CONTRAST  TECHNIQUE: Multidetector CT imaging of the  abdomen and pelvis was performed following the standard protocol without IV contrast.  RADIATION DOSE REDUCTION: This exam was performed according to the departmental dose-optimization program which includes automated exposure control, adjustment of the mA and/or kV according to patient size and/or use of iterative reconstruction technique.  COMPARISON:  07/23/2021  FINDINGS: Lower chest: Emphysematous changes in the lungs. Patchy interstitial changes in the lung bases likely fibrosis. Similar appearance to previous study. Large esophageal hiatal hernia.  Hepatobiliary: Gallbladder and bile ducts are normal. Circumscribed low-attenuation lesion in the lateral segment left lobe of liver measuring 2.2 cm diameter. No change since prior study. This is likely a cyst. No imaging follow-up is indicated.  Pancreas: Unremarkable. No pancreatic ductal dilatation or surrounding  inflammatory changes.  Spleen: Normal in size without focal abnormality.  Adrenals/Urinary Tract: No adrenal gland nodules. Bilateral renal parenchymal atrophy and scarring. Several 2 mm calcifications in both kidneys likely small stones. No hydronephrosis or hydroureter. No ureteral stones identified. Bladder is normal.  Stomach/Bowel: Stomach and small bowel are decompressed. Scattered stool throughout the colon. No colonic distention or wall thickening. Scattered colonic diverticula without evidence of acute diverticulitis. Appendix is normal.  Vascular/Lymphatic: Aortic atherosclerosis. No enlarged abdominal or pelvic lymph nodes.  Reproductive: Uterus and bilateral adnexa are unremarkable.  Other: No free air or free fluid in the abdomen. Abdominal wall musculature appears intact.  Musculoskeletal: Degenerative changes in the spine. Lumbar scoliosis convex towards the left. Focal area of sclerosis in the left iliac bone, likely benign bone island. Postoperative left hip arthroplasty.  IMPRESSION: 1.  Bilateral renal parenchymal atrophy and scarring. Tiny nonobstructing intrarenal stones bilaterally. 2. Aortic atherosclerosis. 3. Large esophageal hiatal hernia. 4. 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   Electronically Signed By: Burman Nieves M.D. On: 05/30/2023 21:26   Assessment & Plan:    1. Bilateral nephrolithiasis Small non-obstructing renal calculi. We discussed this would not make an impact on her overall renal function.  Sched 1 year follow-up with [facility].  [facility] [address] [place], [place] [address] [phone]

## 2023-10-08 ENCOUNTER — Telehealth: Payer: Self-pay | Admitting: *Deleted

## 2023-10-08 NOTE — Telephone Encounter (Signed)
 [facility]  called and wanted us  to know that patient had blood in her urina. States her doctor is stopping her Eliquis for three days.

## 2024-07-15 ENCOUNTER — Ambulatory Visit: Admitting: Urology
